# Patient Record
Sex: Female | Born: 1955 | Race: White | Hispanic: No | Marital: Single | Smoking: Former smoker
Health system: Southern US, Community
[De-identification: ages and names within clinical notes are randomized; demographics above are authoritative.]

## PROBLEM LIST (undated history)

## (undated) DIAGNOSIS — T7840XA Allergy, unspecified, initial encounter: Secondary | ICD-10-CM

## (undated) DIAGNOSIS — G4700 Insomnia, unspecified: Secondary | ICD-10-CM

## (undated) DIAGNOSIS — Z87442 Personal history of urinary calculi: Secondary | ICD-10-CM

## (undated) DIAGNOSIS — E785 Hyperlipidemia, unspecified: Secondary | ICD-10-CM

## (undated) DIAGNOSIS — R519 Headache, unspecified: Secondary | ICD-10-CM

## (undated) DIAGNOSIS — K219 Gastro-esophageal reflux disease without esophagitis: Secondary | ICD-10-CM

## (undated) DIAGNOSIS — Z8709 Personal history of other diseases of the respiratory system: Secondary | ICD-10-CM

## (undated) DIAGNOSIS — R531 Weakness: Secondary | ICD-10-CM

## (undated) DIAGNOSIS — Z860100 Personal history of colon polyps, unspecified: Secondary | ICD-10-CM

## (undated) DIAGNOSIS — K449 Diaphragmatic hernia without obstruction or gangrene: Secondary | ICD-10-CM

## (undated) DIAGNOSIS — F419 Anxiety disorder, unspecified: Secondary | ICD-10-CM

## (undated) DIAGNOSIS — M7072 Other bursitis of hip, left hip: Secondary | ICD-10-CM

## (undated) DIAGNOSIS — F329 Major depressive disorder, single episode, unspecified: Secondary | ICD-10-CM

## (undated) DIAGNOSIS — Z8619 Personal history of other infectious and parasitic diseases: Secondary | ICD-10-CM

## (undated) DIAGNOSIS — R51 Headache: Secondary | ICD-10-CM

## (undated) DIAGNOSIS — F32A Depression, unspecified: Secondary | ICD-10-CM

## (undated) DIAGNOSIS — M549 Dorsalgia, unspecified: Secondary | ICD-10-CM

## (undated) DIAGNOSIS — I729 Aneurysm of unspecified site: Secondary | ICD-10-CM

## (undated) DIAGNOSIS — G4730 Sleep apnea, unspecified: Secondary | ICD-10-CM

## (undated) HISTORY — DX: Anxiety disorder, unspecified: F41.9

## (undated) HISTORY — DX: Diaphragmatic hernia without obstruction or gangrene: K44.9

## (undated) HISTORY — PX: COLONOSCOPY: SHX174

## (undated) HISTORY — DX: Allergy, unspecified, initial encounter: T78.40XA

## (undated) HISTORY — DX: Aneurysm of unspecified site: I72.9

## (undated) HISTORY — PX: POLYPECTOMY: SHX149

## (undated) HISTORY — PX: ESOPHAGOGASTRODUODENOSCOPY: SHX1529

## (undated) HISTORY — DX: Other bursitis of hip, left hip: M70.72

## (undated) HISTORY — DX: Hyperlipidemia, unspecified: E78.5

## (undated) HISTORY — PX: DILATION AND CURETTAGE OF UTERUS: SHX78

---

## 1984-12-10 HISTORY — PX: WISDOM TOOTH EXTRACTION: SHX21

## 1985-12-10 HISTORY — PX: KNEE CARTILAGE SURGERY: SHX688

## 2002-12-10 HISTORY — PX: ABDOMINAL HYSTERECTOMY: SHX81

## 2003-12-11 LAB — HM MAMMOGRAPHY

## 2004-12-10 HISTORY — PX: CEREBRAL ANEURYSM REPAIR: SHX164

## 2004-12-18 ENCOUNTER — Ambulatory Visit: Payer: Self-pay | Admitting: Internal Medicine

## 2005-08-27 ENCOUNTER — Encounter: Payer: Self-pay | Admitting: Internal Medicine

## 2005-09-04 ENCOUNTER — Ambulatory Visit (HOSPITAL_COMMUNITY): Admission: RE | Admit: 2005-09-04 | Discharge: 2005-09-05 | Payer: Self-pay | Admitting: Interventional Radiology

## 2005-09-18 ENCOUNTER — Encounter: Payer: Self-pay | Admitting: Interventional Radiology

## 2005-12-14 ENCOUNTER — Ambulatory Visit (HOSPITAL_COMMUNITY): Admission: RE | Admit: 2005-12-14 | Discharge: 2005-12-14 | Payer: Self-pay | Admitting: Interventional Radiology

## 2006-07-10 ENCOUNTER — Ambulatory Visit (HOSPITAL_COMMUNITY): Admission: RE | Admit: 2006-07-10 | Discharge: 2006-07-10 | Payer: Self-pay | Admitting: Interventional Radiology

## 2006-09-05 ENCOUNTER — Ambulatory Visit: Payer: Self-pay | Admitting: Internal Medicine

## 2006-11-28 ENCOUNTER — Ambulatory Visit: Payer: Self-pay | Admitting: Specialist

## 2007-02-18 ENCOUNTER — Ambulatory Visit (HOSPITAL_COMMUNITY): Admission: RE | Admit: 2007-02-18 | Discharge: 2007-02-18 | Payer: Self-pay | Admitting: Interventional Radiology

## 2008-01-11 DIAGNOSIS — K449 Diaphragmatic hernia without obstruction or gangrene: Secondary | ICD-10-CM

## 2008-01-11 HISTORY — DX: Diaphragmatic hernia without obstruction or gangrene: K44.9

## 2008-03-01 ENCOUNTER — Ambulatory Visit (HOSPITAL_COMMUNITY): Admission: RE | Admit: 2008-03-01 | Discharge: 2008-03-01 | Payer: Self-pay | Admitting: Interventional Radiology

## 2009-10-11 ENCOUNTER — Ambulatory Visit: Payer: Self-pay | Admitting: Internal Medicine

## 2009-10-14 LAB — HM DEXA SCAN

## 2011-03-08 ENCOUNTER — Ambulatory Visit: Payer: Self-pay | Admitting: Internal Medicine

## 2011-04-27 NOTE — Discharge Summary (Signed)
NAME:  Katrina Woods, Katrina Woods                ACCOUNT NO.:  [id number]   MEDICAL RECORD NO.:  [id number]          PATIENT TYPE:  OIB   LOCATION:  3113                         FACILITY:  [facility]   PHYSICIAN:  Sanjeev K. Deveshwar, M.D.DATE OF BIRTH:  04-10-1956   DATE OF ADMISSION:  09/04/2005  DATE OF DISCHARGE:                                 DISCHARGE SUMMARY   BRIEF HISTORY:  This is a very pleasant 55 year old female with a history of  intermittent headaches for several years. She had a CT scan performed  recently that was consistent with an aneurysm. She was referred to Dr.  Corliss Skains, who saw the patient on August 27, 2005. Arrangements were made  for the patient to return to [facility] on September 26 to undergo  cerebral angiogram with possible coiling and stent.   PAST MEDICAL HISTORY:  Significant for hyperlipidemia, gastroesophageal  reflux disease. She had colitis as a child. She has borderline hypertension.   SURGICAL HISTORY:  Significant for hysterectomy, knee surgery, C-section,  and D&C.   ALLERGIES:  No known drug allergies.   MEDICATIONS PRIOR TO ADMISSION:  1.  Lipitor 20 mg daily.  2.  Nexium 40 mg daily.  3.  Zoloft b.i.d.  4.  Percocet p.r.n.  5.  Xanax p.r.n.  6.  Flexeril p.r.n.   SOCIAL HISTORY:  The patient is divorced. She has a boyfriend. She has two  children. She lives in [place]. She smokes one pack of cigarettes per day and  has done so for 25-30 years. She uses alcohol occasionally. She works as a  Production designer, theatre/television/film at [employer].   FAMILY HISTORY:  The patient's mother died at age 53 of lung cancer. She  also had diabetes. Her father is alive at age 50. He has no significant  medical illnesses. She has two brothers. One has hypertension, elevated  cholesterol levels, and diabetes. The other is alive and well.   HOSPITAL COURSE:  As noted, this patient was admitted to [facility]  on September 04, 2005 to undergo a cerebral angiogram  and possible  intervention on a suspected aneurysm. The angiogram was performed on the day  of admission. 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 The patient subsequently was placed under general anesthesia and  underwent coiling of the aneurysm as well as placement of a Neuroform stent.  She tolerated this procedure well. She was admitted to the neurology  intensive care unit following the procedure.   The patient was treated with IV heparin overnight. This was discontinued the  following morning. The sheath was removed from her right groin and she  remained at bedrest for 6 more hours. The plan at this point is to ambulate  the patient. If she is feeling well we will plan on discharge early this  evening. Her neurological exam has been stable without change and revealing  no deficits.   LABORATORY DATA:  On the day of discharge, hemoglobin was 11.8; hematocrit  34.4; wbc's 9200; platelets 207,000. BUN was 8, creatinine 1.0, potassium  4.2, glucose  110.   DISCHARGE INSTRUCTIONS:  The patient was told to remain on Plavix for 3 days  only. She was given a prescription. She was to take aspirin once daily. She  was to remain on her other home medications. She was told to stay on a low  salt low fat diet. She was not to drive, lift anything heavy, do any bending  or stooping, or to return to work for at least 2 weeks. She was to have a  repeat angiogram in 3 months. She was told to follow up with Dr. Welton Flakes as  needed or as scheduled. She was to see Dr. Corliss Skains Tuesday, September 18, 2005 at 2 p.m.   PROBLEM LIST AT TIME OF DISCHARGE:  1.  Cerebral aneurysm.  2.  Status post Neuroform stenting and coiling of the aneurysm with      excellent results.  3.  History of gastroesophageal reflux disease.  4.  History of severe headaches.  5.  History of hyperlipidemia.  6.  History of colitis as a child.  7.  Questionable borderline hypertension.  8.  Status  post multiple surgeries.  9.  Mild anemia.      Delton See, P.A.    ______________________________  Grandville Silos. Corliss Skains, M.D.    DR/MEDQ  D:  09/05/2005  T:  09/05/2005  Job:  [id number]   cc:   Yves Dill  Fax: [phone]

## 2011-04-27 NOTE — Consult Note (Signed)
NAME:  Katrina Woods, Katrina Woods                ACCOUNT NO.:  [id number]   MEDICAL RECORD NO.:  [id number]          PATIENT TYPE:  OUT   LOCATION:  XRAY                         FACILITY:  [facility]   PHYSICIAN:  Sanjeev K. Deveshwar, M.D.DATE OF BIRTH:  [birth year]   DATE OF CONSULTATION:  08/27/2005  DATE OF DISCHARGE:                                   CONSULTATION   REFERRING PHYSICIAN:  Yves Dill, MD   CHIEF COMPLAINT:  Cerebral aneurysm.   HISTORY OF PRESENT ILLNESS:  This is a very pleasant 55 year old right-  handed female with a history of intermittent severe headaches over the past  year.  She recently had a CT scan that was consistent with an aneurysm; she  was referred to Dr. Corliss Skains for further evaluation.   The patient reports having intermittent numbness of her extremities as well  as some occasional blurred vision and dizziness.  She recently fainted at  work; this is a brief episode.  She was mildly confused when she awoke;  however, she declined to have medical followup at that time.  She denies any  speech problems.  She is here now to discuss possible treatment options for  her aneurysm.   PAST MEDICAL HISTORY:  Significant for recently diagnosed hyperlipidemia,  currently being treated with Lipitor.  She has gastroesophageal reflux  disease for which she takes Nexium.  She had colitis as a child.  She has  had borderline hypertension.   PAST SURGICAL HISTORY:  The patient is status post hysterectomy, knee  surgery, C-section and D&C.   ALLERGIES:  No known drug allergies.   CURRENT MEDICATIONS:  Lipitor, Nexium, Zoloft, Percocet p.r.n., Xanax  p.r.n., Flexeril p.r.n.  She recently completed a course of doxycycline for  suspected Lyme's disease.   SOCIAL HISTORY:  The patient is divorced.  She is accompanied today by a  significant-other.  She has 2 children.  She lives in [place].  She smokes 1  pack cigarettes per day and has done so for 25-30 years.  She uses  alcohol  occasionally.  She works as a Production designer, theatre/television/film at [employer].   FAMILY HISTORY:  Her mother died at age 41 from lung cancer; she had  diabetes.  Her father is alive at age 73; he has no significant medical  illnesses.  She has 2 brothers, 1 has hypertension, elevated cholesterol  levels and diabetes; the other is alive and well.   REVIEW OF SYSTEMS:  Please Woods symptoms as noted above.   IMPRESSION:  1.  Recently diagnosed cerebral aneurysm.  2.  History of severe headaches.  3.  Hyperlipidemia.  4.  Gastroesophageal reflux disease.  5.  History of colitis as a child.  6.  Borderline hypertension.  7.  Status post multiple surgeries.   PLAN:  Dr. Corliss Skains discussed the patient's aneurysm as well as possible  treatment options including watchful waiting, surgical clipping and  endovascular coiling.  The risks and benefits were discussed in great  detail.  All the patient's questions were answered.  Dr. Corliss Skains  recommended a cerebral angiogram for further evaluation of  the aneurysm with  possible coiling to be performed directly after the angiogram.  He  recommended that the patient and her boyfriend go home and think about how  they would like to approach this issue.  We have tentatively scheduled the  patient for next Wednesday, the 27th, for her angiogram and possible  intervention.  She was told that she was free to cancel if she decided that  she did not want to proceed.      Katrina Woods, P.A.    ______________________________  Grandville Silos. Corliss Skains, M.D.    DR/MEDQ  D:  08/27/2005  T:  08/28/2005  Job:  [id number]   cc:   Yves Dill  [address]  [place]  [place] [address]  Fax: [phone]

## 2011-04-27 NOTE — Consult Note (Signed)
NAME:  Katrina Woods, Katrina Woods                ACCOUNT NO.:  [id number]   MEDICAL RECORD NO.:  [id number]          PATIENT TYPE:  OUT   LOCATION:  XRAY                         FACILITY:  [facility]   PHYSICIAN:  Sanjeev K. Deveshwar, M.D.DATE OF BIRTH:  1956-10-21   DATE OF CONSULTATION:  09/18/2005  DATE OF DISCHARGE:                                   CONSULTATION   BRIEF HISTORY:  This is a pleasant 55 year old female with a history of  intermittent headaches.  She had a CT scan performed which revealed a  basilar apex aneurysm.  The patient was referred to Dr. Corliss Skains for  further evaluation and treatment.  Dr. Corliss Skains met with the patient in  consultation and arrangements were made to have the patient admitted to  [facility] for a cerebral angiogram and coiling of the aneurysm.  The patient presented to [facility] on September 26 and underwent  coiling of the aneurysm under general anesthesia.  The patient tolerated  this well and arrangements were made to discharge the patient the following  day.   PAST MEDICAL HISTORY:  Significant for hyperlipidemia, gastroesophageal  reflux disease and a question of borderline hypertension.   SOCIAL HISTORY:  Significant for a hysterectomy, knee surgery, C-section,  and D&C.   ALLERGIES:  No known drug allergies.   CURRENT MEDICATIONS:  Lipitor, Nexium, Zoloft, Percocet p.r.n., Xanax  p.r.n., and Flexeril p.r.n.   FAMILY HISTORY:  The patient's mother died at age 7 from lung cancer.  Her  father is alive at age 23.  He has no significant medical illnesses.   IMPRESSION AND PLAN:  Dr. Corliss Skains met with the patient today.  She has  successfully quit smoking since her hospital discharge.  She has been  somewhat anxious due in part to withdrawing from tobacco as well as her  recent intervention.  She has occasional dull headaches.  She had a  particularly severe headache this past Sunday that was relieved with  Percocet and sleep.   She has had some stiffness in her neck.  She describes  a constant ringing in her right ear and blurred vision in her right eye.   The patient is currently taking aspirin daily.  She had been on Plavix in  addition to the aspirin initially after her hospitalization.   The patient has not yet returned to work.  She has a very stressful job.  She feels somewhat deconditioned following her procedure and does not feel  up to returning to work at this time.  Dr. Corliss Skains suggests that she make  arrangements to return to work for several hours at a time in order to  readjust to her work schedule.   The plan at this time is to perform a repeat cerebral angiogram 3 months out  from her initial coiling.   Greater than 30 minutes was spent with the patient today.      Delton See, P.A.    ______________________________  Grandville Silos. Corliss Skains, M.D.    DR/MEDQ  D:  09/18/2005  T:  09/18/2005  Job:  [id number]   cc:  Yves Dill  Fax: [phone]

## 2011-04-27 NOTE — H&P (Signed)
NAME:  Katrina Woods, Katrina Woods                ACCOUNT NO.:  [id number]   MEDICAL RECORD NO.:  [id number]          PATIENT TYPE:  AMB   LOCATION:  SDS                          FACILITY:  [facility]   PHYSICIAN:  Sanjeev K. Deveshwar, M.D.DATE OF BIRTH:  1956-11-02   DATE OF ADMISSION:  09/04/2005  DATE OF DISCHARGE:                                HISTORY & PHYSICAL   CHIEF COMPLAINT:  The patient is here for a cerebral angiogram today with  possible coiling, possible stenting of a cerebral aneurysm.   HISTORY OF PRESENT ILLNESS:  This is a pleasant 55 year old female with a  history of headaches.  She had a CT scan performed recently that was  consistent with an aneurysm.  The patient was referred to Dr. Corliss Skains, who  saw the patient on August 27, 2005, and discussed possible treatment  options for her aneurysm including watchful waiting, open surgery and  endovascular coiling.   The patient has been having some intermittent numbness of her extremities,  occasional blurred vision, dizziness and problems with her balance.  She had  an episode at work recently where she fainted briefly.  Since being seen in  consultation on August 27, 2005, the patient reports that she had had  some mild visual problems and continued poor balance but no other new  symptoms.  The patient has elected to proceed with a cerebral angiogram and  possible endovascular coiling and/or stenting of the aneurysm.   PAST MEDICAL HISTORY:  1.  Hyperlipidemia.  2.  Gastroesophageal reflux disease.  3.  Colitis as a child.  4.  Borderline hypertension.   PAST SURGICAL HISTORY:  1.  Hysterectomy.  2.  C-section.  3.  D&C.  4.  Knee surgery.   ALLERGIES:  No known drug allergies.   MEDICATIONS:  Lipitor, Nexium, Zoloft, Percocet, Xanax, Flexeril, and she  was recently treated with doxycycline for a suspected case of Lyme disease.  The patient was given aspirin and Plavix prior to the angiogram today.   SOCIAL  HISTORY:  The patient is divorced.  She has a boyfriend.  She has two  children.  She lives in [place].  She smokes one pack of cigarettes per day  and has done so for 25-30 years.  Dr. Corliss Skains strongly urged the patient  to quit smoking.  She also uses alcohol occasionally.  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   FAMILY HISTORY:  The patient's mother died at age 9 from lung cancer.  She  also had diabetes.  Her father is alive at age 62.  He has no significant  medical problems.  She has two brothers.  One has hypertension and elevated  cholesterol levels as well as diabetes.  The other is alive and well.   REVIEW OF SYSTEMS:  Completely negative except for some recent mild chest  discomfort, which she attributes to anxiety.  She notes that she bruises  easily.  She has had some visual problems and poor balance.  Otherwise, the  review of systems is negative.   PHYSICAL EXAMINATION:  GENERAL:  A very  pleasant 55 year old white female in  no acute distress.  VITAL SIGNS:  Blood pressure 105/75, pulse 65, respirations 16, temperature  96.8, oxygen saturation 99% on room air.  Her airway was rated at a 1, her  ASA scale was a 1.  HEENT:  Unremarkable.  NECK:  No bruits, no jugular venous distention.  CARDIAC:  Regular rate and rhythm without murmur.  CHEST:  Lungs are clear.  ABDOMEN:  Soft, nontender.  EXTREMITIES:  Pulses intact, without edema.  SKIN:  Warm and dry.  NEUROLOGIC:  Mental status:  The patient is alert and oriented, follows  commands.  Cranial nerves II-XII grossly intact.  Sensation is intact to  light touch.  Motor strength was 5/5 throughout except for the left upper  extremity, which was not tested due to anesthesia placing an IV line.  The  patient denies any weakness.  Cerebellar testing was intact on the right.   LABORATORY DATA:  BUN was 18, creatinine 1.1, potassium 4, glucose 104.  INR  was 0.9, PTT was 29.  Hemoglobin 13.3, hematocrit 38.3, platelets  162,000,  white blood cell count was 10.1 thousand.   IMPRESSION:  1.  History of cerebral aneurysm by CT scan, cerebral angiogram today with      possible endovascular coiling and/or stenting of the aneurysm.  2.  History of hyperlipidemia.  3.  Gastroesophageal reflux disease.  4.  History of borderline hypertension.  5.  Status post multiple surgeries as noted above.  6.  Probable anxiety and depression.  7.  Ongoing tobacco use.  8.  History of severe headaches.      Delton See, P.A.    ______________________________  Grandville Silos. Corliss Skains, M.D.   DR/MEDQ  D:  09/04/2005  T:  09/04/2005  Job:  [id number]   cc:   Yves Dill  Fax: [phone]   Payton Doughty, M.D.  Fax: [phone]

## 2011-04-27 NOTE — Discharge Summary (Signed)
NAME:  Katrina, Woods                ACCOUNT NO.:  [id number]   MEDICAL RECORD NO.:  [id number]          PATIENT TYPE:  OIB   LOCATION:  3113                         FACILITY:  [facility]   PHYSICIAN:  Sanjeev K. Deveshwar, M.D.DATE OF BIRTH:  19-Oct-1956   DATE OF ADMISSION:  09/04/2005  DATE OF DISCHARGE:  09/05/2005                                 DISCHARGE SUMMARY   ADDENDUM:  This aneurysm was treated with endovascular coiling.  A neuroform  stent was not used during the procedure as previously dictated.  Please see  Dr. Fatima Sanger complete dictated report for full details.      Delton See, P.A.    ______________________________  Grandville Silos. Corliss Skains, M.D.    DR/MEDQ  D:  09/05/2005  T:  09/05/2005  Job:  [id number]   cc:   Drue Second, MD  Fax: [phone]

## 2011-04-27 NOTE — Consult Note (Signed)
NAME:  GRACELAND, WACHTER                ACCOUNT NO.:  [id number]   MEDICAL RECORD NO.:  [id number]          PATIENT TYPE:  OUT   LOCATION:  XRAY                         FACILITY:  [facility]   PHYSICIAN:  Sanjeev K. Deveshwar, M.D.DATE OF BIRTH:  1956-06-09   DATE OF CONSULTATION:  08/27/2005  DATE OF DISCHARGE:                                   CONSULTATION   ADDENDUM  Greater than 40 minutes was spent on this consult today.      Delton See, P.A.    ______________________________  Grandville Silos. Corliss Skains, M.D.    DR/MEDQ  D:  08/27/2005  T:  08/27/2005  Job:  [id number]

## 2011-09-03 LAB — PROTIME-INR
INR: 0.8
Prothrombin Time: 11.7

## 2011-09-03 LAB — CBC
HCT: 43.4
Hemoglobin: 14.9
MCHC: 34.3
MCV: 89.8
Platelets: 244
RBC: 4.83
RDW: 13.6
WBC: 7.5

## 2011-09-03 LAB — BASIC METABOLIC PANEL
BUN: 14
Chloride: 105
GFR calc Af Amer: >60
GFR calc non Af Amer: >60

## 2011-09-28 ENCOUNTER — Ambulatory Visit: Payer: Self-pay | Admitting: Internal Medicine

## 2012-07-10 DIAGNOSIS — M7072 Other bursitis of hip, left hip: Secondary | ICD-10-CM

## 2012-07-10 HISTORY — DX: Other bursitis of hip, left hip: M70.72

## 2012-09-11 ENCOUNTER — Encounter: Payer: Self-pay | Admitting: Internal Medicine

## 2012-09-11 ENCOUNTER — Ambulatory Visit (INDEPENDENT_AMBULATORY_CARE_PROVIDER_SITE_OTHER): Payer: BC Managed Care – PPO | Admitting: Internal Medicine

## 2012-09-11 VITALS — BP 140/86 | HR 70 | Temp 98.0°F | Ht 62.5 in | Wt 140.8 lb

## 2012-09-11 DIAGNOSIS — E785 Hyperlipidemia, unspecified: Secondary | ICD-10-CM

## 2012-09-11 DIAGNOSIS — M7072 Other bursitis of hip, left hip: Secondary | ICD-10-CM | POA: Insufficient documentation

## 2012-09-11 DIAGNOSIS — Z1159 Encounter for screening for other viral diseases: Secondary | ICD-10-CM

## 2012-09-11 DIAGNOSIS — Z23 Encounter for immunization: Secondary | ICD-10-CM

## 2012-09-11 DIAGNOSIS — Z1239 Encounter for other screening for malignant neoplasm of breast: Secondary | ICD-10-CM

## 2012-09-11 DIAGNOSIS — F329 Major depressive disorder, single episode, unspecified: Secondary | ICD-10-CM

## 2012-09-11 DIAGNOSIS — I729 Aneurysm of unspecified site: Secondary | ICD-10-CM

## 2012-09-11 DIAGNOSIS — Z720 Tobacco use: Secondary | ICD-10-CM

## 2012-09-11 DIAGNOSIS — F17200 Nicotine dependence, unspecified, uncomplicated: Secondary | ICD-10-CM

## 2012-09-11 DIAGNOSIS — F32A Depression, unspecified: Secondary | ICD-10-CM

## 2012-09-11 DIAGNOSIS — F3289 Other specified depressive episodes: Secondary | ICD-10-CM

## 2012-09-11 DIAGNOSIS — M546 Pain in thoracic spine: Secondary | ICD-10-CM

## 2012-09-11 DIAGNOSIS — R1011 Right upper quadrant pain: Secondary | ICD-10-CM

## 2012-09-11 DIAGNOSIS — K449 Diaphragmatic hernia without obstruction or gangrene: Secondary | ICD-10-CM | POA: Insufficient documentation

## 2012-09-11 DIAGNOSIS — I671 Cerebral aneurysm, nonruptured: Secondary | ICD-10-CM | POA: Insufficient documentation

## 2012-09-11 MED ORDER — ALPRAZOLAM 0.5 MG PO TABS: 0.5000 mg | ORAL_TABLET | Freq: Every evening | ORAL | Status: DC | PRN

## 2012-09-11 MED ORDER — ESCITALOPRAM OXALATE 5 MG PO TABS: 5.0000 mg | ORAL_TABLET | Freq: Every day | ORAL | Status: DC

## 2012-09-11 NOTE — Patient Instructions (Addendum)
We are prescribing low dose lexapro for hot flashes and depression.  Start with 1/2 tabl daily for one week, the in crease to 1 talbet.  We may incese at one month follow up.   Return for fasting labs at your leisure.  Return for follow up in one month

## 2012-09-11 NOTE — Progress Notes (Signed)
Patient ID: JULIANY DAUGHETY, female   DOB: 02-10-1956, 55 y.o.   MRN: [id number]   Patient Active Problem List  Diagnosis  . Bursitis of left hip  . Hiatal hernia  . Hyperlipidemia  . Aneurysm  . Tobacco abuse  . Depression  . Back pain, thoracic    Subjective:  CC:   Chief Complaint  Patient presents with  . Establish Care    new patient  . Injections    flu    HPI:   TENIYAH SEIVERT is a 56 y.o. female who presents as a new patient to establish primary care with the chief complaint of  Abdominal pain.  Right UQ, starts in back wraps around to front. Pain 4/10n in the morning, at night is 7/10 not sure if it is aggravated by eating but gets progressively worse as the day goes on.  Not made worse by smoking (10-12 per day) Wakes her up multiple times per night ,  Along with night sweats.  History of hiatal hernia found during workup for chest pain due to panic attack.  Stress test was normal. Sent to [facility] to discuss back pain before the results of the CT was done( in office to evaluate 2 yr history of persistent pain right posterior back pain that radiates to the RUQ,  More intense as the day progresses Ct was negative,  ultrasound was notable for fluid fill,ed no stones.,   negative. HIDA noted normal EF  But took a long time to fill the GB.    Gabapentin titrated to the point of dizziness with no relief of pain .   2)3) History of brain aneurysm, history of syncope and headaches.  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  Last follow up May 18, 2008. Quit smoking for 8 months and has resumed tobacco use.  Wants to quit  History of depression started after mother died in May 18, 2000.  Still takes care of father who had Alzheimers Dementia who lives alone but she spends every evening with him and administers his pain meds for chreonci pain trauma induced .  Single. Lives alone, has a dog. 30 lbs,  Spitz.   Treated with SSRI  For a while but she stopped.  Had  panic attacks treated with prn xanax [facility] [employer] . Previous patient of Welton Flakes Transferring bc she was continually seen by the PA .  History of brain aneurysm, history of syncope and headaches.  Positive MRA/MRA referred to [employer] in 05-18-2005.  Last follow up 2008/05/18. Quit smoking for 8 months and has resumed tobacco use.      HM: no mammogram in years. History of hyperlipidemia with intolerance to statins (joint pain) incl simvastatin, lipitor and crestor    Past Medical History  Diagnosis Date  . Bursitis of left hip 8-13    takes Meloxicam  . Chicken pox as a child  . Measles as a child  . [place] measles as a child  . Mumps as a child  . Hiatal hernia 209  . Hyperlipidemia   . Aneurysm 05-18-2005    brain- treated by Dr Granville Lewis (?) [facility]  . Endometriosis     Past Surgical History  Procedure Date  . Abdominal hysterectomy 05-19-2003    total  . Knee cartilage surgery 1987    Left knee- torn cartlidge  . Cesarean section 11-25-82  . Wisdom tooth extraction 1986    all four extracted  . Cerebral  aneurysm repair 2006    Deveshwar, coil     Family History  Problem Relation Age of Onset  . Diabetes Mother     type 2  . Hypertension Mother   . Heart disease Mother     CHF  . Cancer Mother     lung- previous smoker- had quit 12 yrs before  . Appendicitis Father     acute  . Cancer Father     prostate?  . Diabetes Brother 52    type 2  . Hypertension Brother   . Cancer Maternal Grandmother     breast, uterine  . Hypertension Maternal Grandmother   . Heart disease Maternal Grandmother     CHF  . Diabetes Maternal Grandmother   . Stroke Maternal Grandfather   . Heart block Paternal Grandfather     massive    History   Social History  . Marital Status: Single    Spouse Name: N/A    Number of Children: N/A  . Years of Education: N/A   Occupational History  . Not on file.   Social History Main Topics  . Smoking status: Current Every Day Smoker  -- 0.5 packs/day for 28 years    Types: Cigarettes  . Smokeless tobacco: Never Used  . Alcohol Use: Yes     socially  . Drug Use: No  . Sexually Active: Not Currently   Other Topics Concern  . Not on file   Social History Narrative  . No narrative on file   Allergies  Allergen Reactions  . Statins     Muscle aches     Review of Systems:   The remainder of the review of systems was negative except occasional constipation treated with diet and water.  Some wt loss , unintentional due to depression,  o 15 lbs in the last 2 years.       Objective:  BP 140/86  Pulse 70  Temp 98 F (36.7 C) (Temporal)  Ht 5' 2.5" (1.588 m)  Wt 140 lb 12.8 oz (63.866 kg)  BMI 25.34 kg/m2  SpO2 99%  General appearance: alert, cooperative and appears stated age Ears: normal TM's and external ear canals both ears Throat: lips, mucosa, and tongue normal; teeth and gums normal Neck: no adenopathy, no carotid bruit, supple, symmetrical, trachea midline and thyroid not enlarged, symmetric, no tenderness/mass/nodules Back: symmetric, no curvature. ROM normal. No CVA tenderness. Lungs: clear to auscultation bilaterally Heart: regular rate and rhythm, S1, S2 normal, no murmur, click, rub or gallop Abdomen: soft, non-tender; bowel sounds normal; no masses,  no organomegaly Pulses: 2+ and symmetric Skin: Skin color, texture, turgor normal. No rashes or lesions Lymph nodes: Cervical, supraclavicular, and axillary nodes normal.  Assessment and Plan:  Tobacco abuse Chief for about 8 months after her brain aneurysm repair . she is interested in quitting  Aneurysm Reportedly basilar artery status post coiling by Dr. Corliss Skains in  2006. She's been lost to followup since 2009. Will refer her back to Dr. Gavin Pound for for followup.  Currently asymptomatic.  Hyperlipidemia She will return for fasting lipids and management.  Depression With concurrent anxiety and hot flashes. Trial of Lexapro. Return  in one month.  Back pain, thoracic Her history of 2 years of right-sided back pain which radiates to her right upper quadrant is troubling for gallbladder disease versus vascular disease given her history of tobacco abuse and prior aneurysm brain aneurysm. Her CT scan that was done 2 years ago by Dr. Romeo Apple  has been requested. The ultrasound and hydroscan that were done the hospital suggested a gallbladder that was full fluid with no stones and hydroscan suggested that her gallbladder was delayed in filling but had no trouble emptying. She has no right upper quadrant tenderness on exam. There has been no escalation of her pain. She will return for fasting labs. We'll consider ordering plain films of her thoracic spine at her repeat visit   Updated Medication List Outpatient Encounter Prescriptions as of 09/11/2012  Medication Sig Dispense Refill  . ibuprofen (ADVIL,MOTRIN) 200 MG tablet Take 400 mg by mouth every 8 (eight) hours as needed.      . meloxicam (MOBIC) 15 MG tablet Take 15 mg by mouth daily.      Marland Kitchen omeprazole (PRILOSEC OTC) 20 MG tablet Take 20 mg by mouth daily.      . traMADol (ULTRAM) 50 MG tablet Take 50 mg by mouth every 8 (eight) hours as needed.      . ALPRAZolam (XANAX) 0.5 MG tablet Take 1 tablet (0.5 mg total) by mouth at bedtime as needed for sleep.  30 tablet  3  . escitalopram (LEXAPRO) 5 MG tablet Take 1 tablet (5 mg total) by mouth daily.  30 tablet  1     Orders Placed This Encounter  Procedures  . HM MAMMOGRAPHY  . MM Digital Screening  . HM PAP SMEAR  . Lipid panel  . TSH  . Gamma GT  . Hepatitis C antibody, reflex    No Follow-up on file.

## 2012-09-12 ENCOUNTER — Encounter: Payer: Self-pay | Admitting: Internal Medicine

## 2012-09-12 DIAGNOSIS — Z87891 Personal history of nicotine dependence: Secondary | ICD-10-CM | POA: Insufficient documentation

## 2012-09-12 DIAGNOSIS — Z720 Tobacco use: Secondary | ICD-10-CM | POA: Insufficient documentation

## 2012-09-12 DIAGNOSIS — M546 Pain in thoracic spine: Secondary | ICD-10-CM | POA: Insufficient documentation

## 2012-09-12 DIAGNOSIS — F418 Other specified anxiety disorders: Secondary | ICD-10-CM | POA: Insufficient documentation

## 2012-09-12 NOTE — Assessment & Plan Note (Signed)
With concurrent anxiety and hot flashes. Trial of Lexapro. Return in one month.

## 2012-09-12 NOTE — Assessment & Plan Note (Signed)
Reportedly basilar artery status post coiling by Dr. Corliss Skains in  2006. She's been lost to followup since 2009. Will refer her back to Dr. Gavin Pound for for followup.  Currently asymptomatic.

## 2012-09-12 NOTE — Assessment & Plan Note (Addendum)
Her history of 2 years of right-sided back pain which radiates to her right upper quadrant is troubling for gallbladder disease versus vascular disease given her history of tobacco abuse and prior aneurysm brain aneurysm. Her CT scan that was done 2 years ago by Dr. Romeo Apple has been requested. The ultrasound and hydroscan that were done the hospital suggested a gallbladder that was full fluid with no stones and hydroscan suggested that her gallbladder was delayed in filling but had no trouble emptying. She has no right upper quadrant tenderness on exam. There has been no escalation of her pain. She will return for fasting labs. We'll consider ordering plain films of her thoracic spine at her repeat visit

## 2012-09-12 NOTE — Assessment & Plan Note (Signed)
She will return for fasting lipids and management.

## 2012-09-12 NOTE — Assessment & Plan Note (Signed)
Chief for about 8 months after her brain aneurysm repair . she is interested in quitting

## 2012-09-15 ENCOUNTER — Other Ambulatory Visit (INDEPENDENT_AMBULATORY_CARE_PROVIDER_SITE_OTHER): Payer: BC Managed Care – PPO

## 2012-09-15 DIAGNOSIS — F32A Depression, unspecified: Secondary | ICD-10-CM

## 2012-09-15 DIAGNOSIS — R1011 Right upper quadrant pain: Secondary | ICD-10-CM

## 2012-09-15 DIAGNOSIS — F3289 Other specified depressive episodes: Secondary | ICD-10-CM

## 2012-09-15 DIAGNOSIS — E785 Hyperlipidemia, unspecified: Secondary | ICD-10-CM

## 2012-09-15 DIAGNOSIS — F329 Major depressive disorder, single episode, unspecified: Secondary | ICD-10-CM

## 2012-09-15 DIAGNOSIS — Z1159 Encounter for screening for other viral diseases: Secondary | ICD-10-CM

## 2012-09-15 LAB — LIPID PANEL: VLDL: 28.8 mg/dL (ref 0.0–40.0)

## 2012-10-14 ENCOUNTER — Ambulatory Visit (INDEPENDENT_AMBULATORY_CARE_PROVIDER_SITE_OTHER): Payer: BC Managed Care – PPO | Admitting: Internal Medicine

## 2012-10-14 ENCOUNTER — Encounter: Payer: Self-pay | Admitting: Internal Medicine

## 2012-10-14 ENCOUNTER — Encounter: Payer: Self-pay | Admitting: Gastroenterology

## 2012-10-14 VITALS — BP 130/84 | HR 70 | Temp 98.0°F | Ht 62.0 in | Wt 141.5 lb

## 2012-10-14 DIAGNOSIS — K219 Gastro-esophageal reflux disease without esophagitis: Secondary | ICD-10-CM

## 2012-10-14 DIAGNOSIS — G8929 Other chronic pain: Secondary | ICD-10-CM

## 2012-10-14 DIAGNOSIS — Z1239 Encounter for other screening for malignant neoplasm of breast: Secondary | ICD-10-CM

## 2012-10-14 DIAGNOSIS — M545 Low back pain, unspecified: Secondary | ICD-10-CM

## 2012-10-14 DIAGNOSIS — F17200 Nicotine dependence, unspecified, uncomplicated: Secondary | ICD-10-CM

## 2012-10-14 DIAGNOSIS — Z720 Tobacco use: Secondary | ICD-10-CM

## 2012-10-14 DIAGNOSIS — Z1211 Encounter for screening for malignant neoplasm of colon: Secondary | ICD-10-CM

## 2012-10-14 DIAGNOSIS — R1011 Right upper quadrant pain: Secondary | ICD-10-CM

## 2012-10-14 DIAGNOSIS — R109 Unspecified abdominal pain: Secondary | ICD-10-CM

## 2012-10-14 DIAGNOSIS — M546 Pain in thoracic spine: Secondary | ICD-10-CM

## 2012-10-14 DIAGNOSIS — I729 Aneurysm of unspecified site: Secondary | ICD-10-CM

## 2012-10-14 MED ORDER — TRAMADOL HCL 50 MG PO TABS: 50.0000 mg | ORAL_TABLET | Freq: Three times a day (TID) | ORAL | Status: DC | PRN

## 2012-10-14 MED ORDER — OMEPRAZOLE 40 MG PO CPDR: 40.0000 mg | DELAYED_RELEASE_CAPSULE | Freq: Every day | ORAL | Status: DC

## 2012-10-14 NOTE — Patient Instructions (Addendum)
1200 mg calcium daily (diet and supplements)   1000 units of Vitamin D daily    GI referral underway  Changing omeprazole to prescription strength 40 mg daily     plain films of thoracic and lumbar spines ordered  Return for a fasting glucose for your wellness form

## 2012-10-14 NOTE — Progress Notes (Signed)
Patient ID: Katrina Woods, female   DOB: [birth year], 56 y.o.   MRN: [id number] Patient Active Problem List  Diagnosis  . Bursitis of left hip  . Hiatal hernia  . Hyperlipidemia  . Aneurysm  . Tobacco abuse  . Depression  . Back pain, thoracic  . Abdominal pain, chronic, right upper quadrant  . GERD (gastroesophageal reflux disease)    Subjective:  CC:   Chief Complaint  Patient presents with  . Follow-up    HPI:   Katrina Aguirre Smithis a 56 y.o. female who presents Follow up on pain.  She has right sided pain that is chronic in nature with prior gallbladder evaluation which included a normal HIDA scan,  Her pain is constant ,  Not aggravated  By eating. 2)   Has developed sacral pain center of back radiates to the right hip.  3) Daily reflux treated with OTC omeprazole.symptoms occur daily for the past 12 years,  No prior endoscopy.  Has hiatal hernia which was found during eval for chest pain.  Pain occurs daily if she skips the omeprazole. Has nighttime symptoms even with omeprazole.  Insurance would not pay for dexilant which worked Agricultural consultant. Nexium did not work.     Past Medical History  Diagnosis Date  . Bursitis of left hip 8-13    takes Meloxicam  . Chicken pox as a child  . Measles as a child  . [place] measles as a child  . Mumps as a child  . Hiatal hernia 209  . Hyperlipidemia   . 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 Endometriosis     Past Surgical History  Procedure Date  . Abdominal hysterectomy 2004    total  . Knee cartilage surgery 1987    Left knee- torn cartlidge  . Cesarean section 11-25-82  . Wisdom tooth extraction 1986    all four extracted  . Cerebral aneurysm repair 2006    Deveshwar, coil          The following portions of the patient's history were reviewed and updated as appropriate: Allergies, current medications, and problem list.    Review of Systems:   12 Pt  review of systems was negative except  those addressed in the HPI,     History   Social History  . Marital Status: Single    Spouse Name: N/A    Number of Children: N/A  . Years of Education: N/A   Occupational History  . Not on file.   Social History Main Topics  . Smoking status: Current Every Day Smoker -- 0.5 packs/day for 28 years    Types: Cigarettes  . Smokeless tobacco: Never Used  . Alcohol Use: Yes     Comment: socially  . Drug Use: No  . Sexually Active: Not Currently   Other Topics Concern  . Not on file   Social History Narrative  . No narrative on file    Objective:  BP 130/84  Pulse 70  Temp 98 F (36.7 C) (Oral)  Ht 5\' 2"  (1.575 m)  Wt 141 lb 8 oz (64.184 kg)  BMI 25.88 kg/m2  SpO2 98%  General appearance: alert, cooperative and appears stated age Ears: normal TM's and external ear canals both ears Throat: lips, mucosa, and tongue normal; teeth and gums normal Neck: no adenopathy, no carotid bruit, supple, symmetrical, trachea midline and thyroid not enlarged, symmetric, no tenderness/mass/nodules Back: symmetric, no curvature. ROM normal. No  CVA tenderness. Lungs: clear to auscultation bilaterally Heart: regular rate and rhythm, S1, S2 normal, no murmur, click, rub or gallop Abdomen: soft, non-tender; bowel sounds normal; no masses,  no organomegaly Pulses: 2+ and symmetric Skin: Skin color, texture, turgor normal. No rashes or lesions Lymph nodes: Cervical, supraclavicular, and axillary nodes normal.  Assessment and Plan:  Back pain, thoracic Plain films showed no evidence of degenerative changes or arthritis. We'll recommend physical therapy  Aneurysm She has been referred back to Dr. Edwena Felty followup. I've cautioned her that resuming smoking is probably the worse thing she could do for herself in the setting  Tobacco abuse Patient strongly urged to quit smoking given her history of cerebral aneurysms status post coiling  Abdominal pain, chronic, right upper  quadrant Etiology unclear. Her gallbladder had no evidence of stones and hydroscan showed normal ejection fraction. Plain thoracic spine films showed no evidence of degenerative disc changes or arthritic changes. We'll offer CT the abdomen and pelvis for further evaluation  GERD (gastroesophageal reflux disease) Apparently present for over 10 years with no prior endoscopic evaluation for Barretts esophagus. She has been referred to GI for screening colonoscopy and endoscopy.   Updated Medication List Outpatient Encounter Prescriptions as of 10/14/2012  Medication Sig Dispense Refill  . ALPRAZolam (XANAX) 0.5 MG tablet Take 1 tablet (0.5 mg total) by mouth at bedtime as needed for sleep.  30 tablet  3  . escitalopram (LEXAPRO) 5 MG tablet Take 1 tablet (5 mg total) by mouth daily.  30 tablet  1  . ibuprofen (ADVIL,MOTRIN) 200 MG tablet Take 400 mg by mouth every 8 (eight) hours as needed.      . meloxicam (MOBIC) 15 MG tablet Take 15 mg by mouth daily.      . traMADol (ULTRAM) 50 MG tablet Take 1 tablet (50 mg total) by mouth every 8 (eight) hours as needed.  60 tablet  3  . [DISCONTINUED] omeprazole (PRILOSEC OTC) 20 MG tablet Take 20 mg by mouth daily.      . [DISCONTINUED] traMADol (ULTRAM) 50 MG tablet Take 50 mg by mouth every 8 (eight) hours as needed.      Marland Kitchen omeprazole (PRILOSEC) 40 MG capsule Take 1 capsule (40 mg total) by mouth daily.  30 capsule  3     Orders Placed This Encounter  Procedures  . MM Digital Screening  . HM DEXA SCAN  . DG Thoracic Spine W/Swimmers  . DG Lumbar Spine 2-3 Views  . Ambulatory referral to Gastroenterology    No Follow-up on file.

## 2012-10-15 ENCOUNTER — Telehealth: Payer: Self-pay | Admitting: Internal Medicine

## 2012-10-15 ENCOUNTER — Other Ambulatory Visit (INDEPENDENT_AMBULATORY_CARE_PROVIDER_SITE_OTHER): Payer: BC Managed Care – PPO

## 2012-10-15 ENCOUNTER — Ambulatory Visit: Payer: Self-pay | Admitting: Internal Medicine

## 2012-10-15 DIAGNOSIS — R5383 Other fatigue: Secondary | ICD-10-CM

## 2012-10-15 DIAGNOSIS — R5381 Other malaise: Secondary | ICD-10-CM

## 2012-10-15 DIAGNOSIS — E785 Hyperlipidemia, unspecified: Secondary | ICD-10-CM

## 2012-10-15 DIAGNOSIS — E569 Vitamin deficiency, unspecified: Secondary | ICD-10-CM

## 2012-10-15 LAB — CBC WITH DIFFERENTIAL/PLATELET
Basophils Absolute: 0 10*3/uL (ref 0.0–0.1)
Eosinophils Absolute: 0.1 10*3/uL (ref 0.0–0.7)
HCT: 45.9 % (ref 36.0–46.0)
Hemoglobin: 15.1 g/dL — ABNORMAL HIGH (ref 12.0–15.0)
Lymphs Abs: 1.8 10*3/uL (ref 0.7–4.0)
MCHC: 32.8 g/dL (ref 30.0–36.0)
Neutro Abs: 3.8 10*3/uL (ref 1.4–7.7)
RDW: 13.3 % (ref 11.5–14.6)

## 2012-10-15 LAB — COMPREHENSIVE METABOLIC PANEL
ALT: 10 U/L (ref 0–35)
Albumin: 4 g/dL (ref 3.5–5.2)
Alkaline Phosphatase: 49 U/L (ref 39–117)
Glucose, Bld: 92 mg/dL (ref 70–99)
Potassium: 4.5 mEq/L (ref 3.5–5.1)
Sodium: 138 mEq/L (ref 135–145)
Total Protein: 7.4 g/dL (ref 6.0–8.3)

## 2012-10-15 NOTE — Telephone Encounter (Signed)
Her health screening form is ready for sending.  Pleas call patient when it has been faxed and scan to chart.

## 2012-10-15 NOTE — Telephone Encounter (Signed)
[facility] health care provider biometric screening form in box Please advise pt when done

## 2012-10-15 NOTE — Telephone Encounter (Signed)
Left message on patient vm letting her know that her wellness form was faxed and ready for pickup.

## 2012-10-16 ENCOUNTER — Telehealth: Payer: Self-pay | Admitting: Internal Medicine

## 2012-10-16 DIAGNOSIS — D696 Thrombocytopenia, unspecified: Secondary | ICD-10-CM

## 2012-10-16 DIAGNOSIS — K219 Gastro-esophageal reflux disease without esophagitis: Secondary | ICD-10-CM | POA: Insufficient documentation

## 2012-10-16 DIAGNOSIS — G8929 Other chronic pain: Secondary | ICD-10-CM | POA: Insufficient documentation

## 2012-10-16 DIAGNOSIS — R1011 Right upper quadrant pain: Secondary | ICD-10-CM

## 2012-10-16 LAB — VITAMIN D 25 HYDROXY (VIT D DEFICIENCY, FRACTURES): Vit D, 25-Hydroxy: 43 ng/mL (ref 30–89)

## 2012-10-16 NOTE — Telephone Encounter (Signed)
Regarding her blood work, everything was normal with the exception of 2 things. Her cholesterol is high LDL was 160. Given her history of tobacco abuse this combination greatly increased her risk for strokes. Given the fact that she has already had a cerebral aneurysm she really needs to quit smoking.  #2 her platelets were low at 85,000.  normal is 150,000-400,000. Has she ever been told this before? If not we should repeat a CBC and LDH  in one month. If she's taking any aspirin products she should stop them.

## 2012-10-16 NOTE — Assessment & Plan Note (Signed)
Patient strongly urged to quit smoking given her history of cerebral aneurysms status post coiling

## 2012-10-16 NOTE — Assessment & Plan Note (Signed)
Plain films showed no evidence of degenerative changes or arthritis. We'll recommend physical therapy

## 2012-10-16 NOTE — Telephone Encounter (Signed)
Her thoracic and lumbar spine films were largely normal with only very mild changes in the lumbar spine. Nothing to explain her right upper quadrant pain. Since her pain is persistent I would like to offer her the chance to get a CT of the abdomen and pelvis with IV and oral contrast.If she  would like to wait until after she sees GI for her endoscopy and colonoscopy that is ok too

## 2012-10-16 NOTE — Assessment & Plan Note (Signed)
Apparently present for over 10 years with no prior endoscopic evaluation for Barretts esophagus. She has been referred to GI for screening colonoscopy and endoscopy.

## 2012-10-16 NOTE — Assessment & Plan Note (Signed)
Etiology unclear. Her gallbladder had no evidence of stones and hydroscan showed normal ejection fraction. Plain thoracic spine films showed no evidence of degenerative disc changes or arthritic changes. We'll offer CT the abdomen and pelvis for further evaluation

## 2012-10-16 NOTE — Assessment & Plan Note (Signed)
She has been referred back to Dr. Edwena Felty followup. I've cautioned her that resuming smoking is probably the worse thing she could do for herself in the setting

## 2012-10-17 NOTE — Telephone Encounter (Signed)
Ordered

## 2012-10-17 NOTE — Telephone Encounter (Signed)
Patient notified

## 2012-10-17 NOTE — Telephone Encounter (Signed)
Spoke to patient regarding lab results she stated that if she can get a CT before 11/27/12 she will be happy to do it.

## 2012-10-21 ENCOUNTER — Telehealth: Payer: Self-pay | Admitting: Internal Medicine

## 2012-10-21 NOTE — Telephone Encounter (Signed)
Patient wanting her lab results posted to my chart. The patient wanted to see if her paper work for her wellness screening was filled out with her blood glucose and cholesterol and all the other information that they requested .

## 2012-10-21 NOTE — Telephone Encounter (Signed)
Spoke to patient form was faxed on 10/15/12 to [phone]. Advised patient that she could come by the office to pick up her original copy.

## 2012-10-24 ENCOUNTER — Ambulatory Visit: Payer: Self-pay | Admitting: Internal Medicine

## 2012-10-27 ENCOUNTER — Telehealth: Payer: Self-pay | Admitting: Internal Medicine

## 2012-10-27 DIAGNOSIS — K862 Cyst of pancreas: Secondary | ICD-10-CM

## 2012-10-27 DIAGNOSIS — R1011 Right upper quadrant pain: Secondary | ICD-10-CM

## 2012-10-27 MED ORDER — ESOMEPRAZOLE MAGNESIUM 40 MG PO CPDR: 40.0000 mg | DELAYED_RELEASE_CAPSULE | Freq: Every day | ORAL | Status: DC

## 2012-10-27 NOTE — Telephone Encounter (Signed)
Her CT of abdomen and pelvis was normal except for a 4 mm cyst in the pancreas,  Too small to characterize.  The radiologist is recommending repeat CT in 6 months to make sure it is not changing or enlarging.

## 2012-10-27 NOTE — Telephone Encounter (Signed)
Patient notified verbal of results. Patient wants to know what she can do in the meantime for pain.

## 2012-10-27 NOTE — Telephone Encounter (Signed)
I am going to change her omeprazole to nexium and start a gi referral for endoscopy.  Does she have a preference/

## 2012-10-27 NOTE — Telephone Encounter (Signed)
Pt called she wanted more info on her ct results

## 2012-10-28 NOTE — Telephone Encounter (Signed)
Spoke to patient via phone she states that she does not have a preference for GI referral. Patient states that she have an appt on12/5/13 for pre op. And  appt on  11/27/12 colonoscopy.

## 2012-10-28 NOTE — Telephone Encounter (Signed)
Katrina Woods,   This patient is set up for colonoscopy but needs an EGD as well because of persistent RUQ pain. She just had a Normal CT except to 4 mm pancreatic cyst which needs 6 month followup.  Please call GI referral and add Referral for RUQ pain as well so they can cover this in her preop and change the colonoscopy to EGD/colonoscopy

## 2012-10-28 NOTE — Addendum Note (Signed)
Addended by: Sherlene Shams on: 10/28/2012 12:33 PM   Modules accepted: Orders

## 2012-10-29 ENCOUNTER — Telehealth: Payer: Self-pay | Admitting: Gastroenterology

## 2012-10-29 NOTE — Telephone Encounter (Signed)
The patient is scheduled for a screening colon on 11/27/12.  She has GERD.  Please see phone note 10/27/12 and office notes from 10/14/12 of Dr. Darrick Huntsman.  There is not an opening to do a double for 11/27/12, but is it ok to do a direct EGD in January or office visit for eval first?

## 2012-10-29 NOTE — Telephone Encounter (Signed)
OK for direct EGD in Jan, or sooner if she prefers and there is an opening.

## 2012-10-30 NOTE — Telephone Encounter (Signed)
EGD scheduled for 12/18/11.  Marge advised that if the patient wants to do on the same day will be after the first of the year and she can reschedule those dates when she comes for the pre-visit

## 2012-10-31 ENCOUNTER — Encounter: Payer: Self-pay | Admitting: Internal Medicine

## 2012-11-10 ENCOUNTER — Encounter: Payer: Self-pay | Admitting: Internal Medicine

## 2012-11-13 ENCOUNTER — Ambulatory Visit (AMBULATORY_SURGERY_CENTER): Payer: BC Managed Care – PPO | Admitting: *Deleted

## 2012-11-13 ENCOUNTER — Encounter: Payer: Self-pay | Admitting: Gastroenterology

## 2012-11-13 VITALS — Ht 62.0 in | Wt 141.6 lb

## 2012-11-13 DIAGNOSIS — Z1211 Encounter for screening for malignant neoplasm of colon: Secondary | ICD-10-CM

## 2012-11-13 DIAGNOSIS — K219 Gastro-esophageal reflux disease without esophagitis: Secondary | ICD-10-CM

## 2012-11-13 MED ORDER — MOVIPREP 100 G PO SOLR: ORAL | Status: DC

## 2012-11-14 ENCOUNTER — Other Ambulatory Visit: Payer: Self-pay | Admitting: Internal Medicine

## 2012-11-27 ENCOUNTER — Encounter: Payer: Self-pay | Admitting: Gastroenterology

## 2012-11-27 ENCOUNTER — Ambulatory Visit (AMBULATORY_SURGERY_CENTER): Payer: BC Managed Care – PPO | Admitting: Gastroenterology

## 2012-11-27 VITALS — BP 119/77 | HR 53 | Temp 97.5°F | Resp 23 | Ht 62.0 in | Wt 141.0 lb

## 2012-11-27 DIAGNOSIS — D126 Benign neoplasm of colon, unspecified: Secondary | ICD-10-CM

## 2012-11-27 DIAGNOSIS — Z1211 Encounter for screening for malignant neoplasm of colon: Secondary | ICD-10-CM

## 2012-11-27 DIAGNOSIS — K219 Gastro-esophageal reflux disease without esophagitis: Secondary | ICD-10-CM

## 2012-11-27 MED ORDER — SODIUM CHLORIDE 0.9 % IV SOLN: 500.0000 mL | INTRAVENOUS | Status: DC

## 2012-11-27 NOTE — Op Note (Signed)
[facility] [address] [place], [address]   ENDOSCOPY PROCEDURE REPORT  PATIENT: Woods, Katrina  MR#: [id number] BIRTHDATE: 1956/04/02 , 56  yrs. old GENDER: Female ENDOSCOPIST: Meryl Dare, MD, Clementeen Graham REFERRED BY:  Duncan Dull, M.D. PROCEDURE DATE:  11/27/2012 PROCEDURE:  EGD, diagnostic ASA CLASS:     Class II INDICATIONS:  History of esophageal reflux. MEDICATIONS: There was residual sedation effect present from prior procedure, MAC sedation, administered by CRNA, and propofol (Diprivan) 60mg  IV TOPICAL ANESTHETIC: Cetacaine Spray DESCRIPTION OF PROCEDURE: After the risks benefits and alternatives of the procedure were thoroughly explained, informed consent was obtained.  The LB [id number] endoscope was introduced through the mouth and advanced to the second portion of the duodenum. Without limitations.  The instrument was slowly withdrawn as the mucosa was fully examined.  ESOPHAGUS: There was LA Class B esophagitis noted in the distal esophagus.   The esophagus was otherwise normal. STOMACH: The mucosa of the stomach appeared normal. DUODENUM: The duodenal mucosa showed no abnormalities in the bulb and second portion of the duodenum.  Retroflexed views revealed a 4 cm hiatal hernia.    The scope was then withdrawn from the patient and the procedure completed.  COMPLICATIONS: There were no complications. ENDOSCOPIC IMPRESSION: 1.   LA Class B esophagitis 2.   Hiatal hernia  RECOMMENDATIONS: 1.  Anti-reflux regimen 2.  PPI qam    eSigned:  Meryl Dare, MD, [facility] 11/27/2012 9:19 AM

## 2012-11-27 NOTE — Patient Instructions (Addendum)
Discharge instructions given with verbal understanding. Handouts on polyps,hemorrhoids,esopagitis, and a hiatal hernia given. Resume previous medications. YOU HAD AN ENDOSCOPIC PROCEDURE TODAY AT THE  ENDOSCOPY CENTER: Refer to the procedure report that was given to you for any specific questions about what was found during the examination.  If the procedure report does not answer your questions, please call your gastroenterologist to clarify.  If you requested that your care partner not be given the details of your procedure findings, then the procedure report has been included in a sealed envelope for you to review at your convenience later.  YOU SHOULD EXPECT: Some feelings of bloating in the abdomen. Passage of more gas than usual.  Walking can help get rid of the air that was put into your GI tract during the procedure and reduce the bloating. If you had a lower endoscopy (such as a colonoscopy or flexible sigmoidoscopy) you may notice spotting of blood in your stool or on the toilet paper. If you underwent a bowel prep for your procedure, then you may not have a normal bowel movement for a few days.  DIET: Your first meal following the procedure should be a light meal and then it is ok to progress to your normal diet.  A half-sandwich or bowl of soup is an example of a good first meal.  Heavy or fried foods are harder to digest and may make you feel nauseous or bloated.  Likewise meals heavy in dairy and vegetables can cause extra gas to form and this can also increase the bloating.  Drink plenty of fluids but you should avoid alcoholic beverages for 24 hours.  ACTIVITY: Your care partner should take you home directly after the procedure.  You should plan to take it easy, moving slowly for the rest of the day.  You can resume normal activity the day after the procedure however you should NOT DRIVE or use heavy machinery for 24 hours (because of the sedation medicines used during the test).     SYMPTOMS TO REPORT IMMEDIATELY: A gastroenterologist can be reached at any hour.  During normal business hours, 8:30 AM to 5:00 PM Monday through Friday, call [phone].  After hours and on weekends, please call the GI answering service at [phone] who will take a message and have the physician on call contact you.   Following lower endoscopy (colonoscopy or flexible sigmoidoscopy):  Excessive amounts of blood in the stool  Significant tenderness or worsening of abdominal pains  Swelling of the abdomen that is new, acute  Fever of 100F or higher  Following upper endoscopy (EGD)  Vomiting of blood or coffee ground material  New chest pain or pain under the shoulder blades  Painful or persistently difficult swallowing  New shortness of breath  Fever of 100F or higher  Black, tarry-looking stools  FOLLOW UP: If any biopsies were taken you will be contacted by phone or by letter within the next 1-3 weeks.  Call your gastroenterologist if you have not heard about the biopsies in 3 weeks.  Our staff will call the home number listed on your records the next business day following your procedure to check on you and address any questions or concerns that you may have at that time regarding the information given to you following your procedure. This is a courtesy call and so if there is no answer at the home number and we have not heard from you through the emergency physician on call, we will assume  that you have returned to your regular daily activities without incident.  SIGNATURES/CONFIDENTIALITY: You and/or your care partner have signed paperwork which will be entered into your electronic medical record.  These signatures attest to the fact that that the information above on your After Visit Summary has been reviewed and is understood.  Full responsibility of the confidentiality of this discharge information lies with you and/or your care-partner.

## 2012-11-27 NOTE — Op Note (Signed)
[facility] [address] [place], [address]   COLONOSCOPY PROCEDURE REPORT  PATIENT: Katrina Woods, Katrina Woods  MR#: [id number] BIRTHDATE: November 21, 1956 , 56  yrs. old GENDER: Female ENDOSCOPIST: Meryl Dare, MD, [facility] REFERRED XB:[id number] Darrick Huntsman, M.D. PROCEDURE DATE:  11/27/2012 PROCEDURE:   Colonoscopy with snare polypectomy ASA CLASS:   Class II INDICATIONS:average risk screening. MEDICATIONS: MAC sedation, administered by CRNA and propofol (Diprivan) 280mg  IV DESCRIPTION OF PROCEDURE:   After the risks benefits and alternatives of the procedure were thoroughly explained, informed consent was obtained.  A digital rectal exam revealed no abnormalities of the rectum.   The LB [id number]  endoscope was introduced through the anus and advanced to the cecum, which was identified by both the appendix and ileocecal valve. No adverse events experienced.   The quality of the prep was excellent, using MoviPrep  The instrument was then slowly withdrawn as the colon was fully examined.  COLON FINDINGS: Three sessile polyps measuring 5 mm in size were found in the ascending colon, transverse colon, and descending colon.  A polypectomy was performed with a cold snare.  The resection was complete and the polyp tissue was completely retrieved.   A sessile polyp measuring 4 mm in size was found in the sigmoid colon.  A polypectomy was performed with a cold snare. The resection was complete and the polyp tissue was completely retrieved.   The colon was otherwise normal.  There was no diverticulosis, inflammation, polyps or cancers unless previously stated.  Retroflexed views revealed internal hemorrhoids. The time to cecum=3 minutes 38 seconds.  Withdrawal time=12 minutes 32 seconds.  The scope was withdrawn and the procedure completed. COMPLICATIONS: There were no complications.  ENDOSCOPIC IMPRESSION: 1.   Three sessile polyps measuring 5 mm in the ascending  colon, transverse colon, and descending colon; polypectomy was performed with a cold snare 2.   Sessile polyp measuring 4 mm in the sigmoid colon; polypectomy performed with a cold snare 3.   Internal hemorrhoids  RECOMMENDATIONS: 1.  Await pathology results 2.  Repeat colonoscopy in 5 years if polyp adenomatous; otherwise 10 years   eSigned:  Meryl Dare, MD, [facility] 11/27/2012 9:14 AM

## 2012-11-27 NOTE — Progress Notes (Signed)
[id number] a/ox3 pleased with MAC report to [facility]

## 2012-11-27 NOTE — Progress Notes (Signed)
Patient did not experience any of the following events: a burn prior to discharge; a fall within the facility; wrong site/side/patient/procedure/implant event; or a hospital transfer or hospital admission upon discharge from the facility. (G8907) Patient did not have preoperative order for IV antibiotic SSI prophylaxis. (G8918)  

## 2012-11-28 ENCOUNTER — Telehealth: Payer: Self-pay

## 2012-11-28 NOTE — Telephone Encounter (Signed)
  Follow up Call-  Call back number 11/27/2012  Post procedure Call Back phone  # [phone]  Permission to leave phone message Yes     Patient questions:  Do you have a fever, pain , or abdominal swelling? no Pain Score  0 *  Have you tolerated food without any problems? yes  Have you been able to return to your normal activities? yes  Do you have any questions about your discharge instructions: Diet   no Medications  no Follow up visit  no  Do you have questions or concerns about your Care? no  Actions: * If pain score is 4 or above: No action needed, pain <4.

## 2012-12-03 DIAGNOSIS — D126 Benign neoplasm of colon, unspecified: Secondary | ICD-10-CM | POA: Insufficient documentation

## 2012-12-05 ENCOUNTER — Encounter: Payer: Self-pay | Admitting: Gastroenterology

## 2012-12-17 ENCOUNTER — Encounter: Payer: BC Managed Care – PPO | Admitting: Gastroenterology

## 2013-01-06 ENCOUNTER — Encounter: Payer: Self-pay | Admitting: Internal Medicine

## 2013-01-06 ENCOUNTER — Ambulatory Visit (INDEPENDENT_AMBULATORY_CARE_PROVIDER_SITE_OTHER): Payer: BC Managed Care – PPO | Admitting: Internal Medicine

## 2013-01-06 ENCOUNTER — Telehealth: Payer: Self-pay | Admitting: Internal Medicine

## 2013-01-06 VITALS — BP 140/80 | HR 98 | Temp 99.4°F | Resp 17 | Wt 140.5 lb

## 2013-01-06 DIAGNOSIS — J1108 Influenza due to unidentified influenza virus with specified pneumonia: Secondary | ICD-10-CM | POA: Insufficient documentation

## 2013-01-06 DIAGNOSIS — D696 Thrombocytopenia, unspecified: Secondary | ICD-10-CM

## 2013-01-06 DIAGNOSIS — R05 Cough: Secondary | ICD-10-CM

## 2013-01-06 DIAGNOSIS — J180 Bronchopneumonia, unspecified organism: Secondary | ICD-10-CM | POA: Insufficient documentation

## 2013-01-06 DIAGNOSIS — J1289 Other viral pneumonia: Secondary | ICD-10-CM

## 2013-01-06 DIAGNOSIS — J1100 Influenza due to unidentified influenza virus with unspecified type of pneumonia: Secondary | ICD-10-CM

## 2013-01-06 DIAGNOSIS — R059 Cough, unspecified: Secondary | ICD-10-CM

## 2013-01-06 LAB — POCT INFLUENZA A/B: Influenza B, POC: POSITIVE

## 2013-01-06 MED ORDER — LEVOFLOXACIN 500 MG PO TABS: 500.0000 mg | ORAL_TABLET | Freq: Every day | ORAL | Status: DC

## 2013-01-06 MED ORDER — OSELTAMIVIR PHOSPHATE 75 MG PO CAPS: 75.0000 mg | ORAL_CAPSULE | Freq: Two times a day (BID) | ORAL | Status: DC

## 2013-01-06 MED ORDER — PROMETHAZINE HCL 25 MG/ML IJ SOLN
25.0000 mg | Freq: Once | INTRAMUSCULAR | Status: AC
  Administered 2013-01-06: 25 mg via INTRAMUSCULAR

## 2013-01-06 MED ORDER — HYDROCODONE-HOMATROPINE 5-1.5 MG/5ML PO SYRP: 5.0000 mL | ORAL_SOLUTION | Freq: Three times a day (TID) | ORAL | Status: DC | PRN

## 2013-01-06 MED ORDER — PROMETHAZINE HCL 25 MG PO TABS: 25.0000 mg | ORAL_TABLET | Freq: Three times a day (TID) | ORAL | Status: DC | PRN

## 2013-01-06 NOTE — Telephone Encounter (Signed)
FYI: Patient is scheduled today to see you.

## 2013-01-06 NOTE — Assessment & Plan Note (Addendum)
Rapid influenza test was positive for A and B.  Will treat with Tamiflu, phenergan, hycodan,  Adding levaquin given high risk of PNA secondary to tobacco abuse.  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 Several masks given Work note faxed to [facility]

## 2013-01-06 NOTE — Patient Instructions (Addendum)
You have the flu.  You will need to stay out of work the rest of the week,  Start the Tamiflu ASAP and take the entire bottle ( 5 days )   Start the levaquin (antibiotic)  if you develop a productive cough or if you spike a fever after Wednesday.  Use OTC mucinex to break up the phlegm and the hycodan cough syrup every 8 hours for cough control  Ibuprofen 600 mg and tylenol 500 mg every 6 hours for muscel and body aches

## 2013-01-06 NOTE — Telephone Encounter (Signed)
Patient Information:  Caller Name: Naviah  Phone: [phone]  Patient: Katrina Woods, Katrina Woods  Gender: Female  DOB: 1956/11/12  Age: 57 Years  PCP: Duncan Dull (Adults only)  Office Follow Up:  Does the office need to follow up with this patient?: No  Instructions For The Office: N/A  RN Note:  Patient states she developed cough, congestion, headache, fever, aching. States has been taking Advil p.m. with only brief improvement. Patient had Advil P.M. 2 tablets at 0200 01/06/13. Patient taking fluids well. Care advice given per guidelines. Call back parameters reviewed. Patient verbalzies understanding.  Symptoms  Reason For Call & Symptoms: cough, congestion, fever, aches Meds: Omeprazole, Escitalopram, Tramadol Hx: GERD, Anxiety/Depression Allergies: Statins  Reviewed Health History In EMR: Yes  Reviewed Medications In EMR: Yes  Reviewed Allergies In EMR: Yes  Reviewed Surgeries / Procedures: Yes  Date of Onset of Symptoms: 01/05/2013  Treatments Tried: Advil p.m.  Treatments Tried Worked: No  Any Fever: Yes  Fever Taken: Oral  Fever Time Of Reading: 07:30:00  Fever Last Reading: 101  Guideline(s) Used:  Influenza - Seasonal  Disposition Per Guideline:   See Today or Tomorrow in Office  Reason For Disposition Reached:   Patient wants to be seen  Advice Given:  Call Back If:  You become short of breath or worse.  For a Stuffy Nose - Use Nasal Washes:  Introduction: Saline (salt water) nasal irrigation (nasal wash) is an effective and simple home remedy for treating stuffy nose and sinus congestion. The nose can be irrigated by pouring, spraying, or squirting salt water into the nose and then letting it run back out.  Pain and Fever Medicines:  For pain or fever relief, take either acetaminophen or ibuprofen.  Pain and Fever Medicines:  For pain or fever relief, take either acetaminophen or ibuprofen.  Ibuprofen (e.g., Motrin, Advil):  Another choice is to take 600 mg  (three 200 mg pills) by mouth every 8 hours.  Appointment Scheduled:  01/06/2013 09:30:00 Appointment Scheduled Provider:  Duncan Dull (Adults only)

## 2013-01-06 NOTE — Progress Notes (Signed)
Patient ID: Katrina Woods, female   DOB: 1956/09/03, 57 y.o.   MRN: [id number]    Patient Active Problem List  Diagnosis  . Bursitis of left hip  . Hiatal hernia  . Hyperlipidemia  . Aneurysm  . Tobacco abuse  . Depression  . Back pain, thoracic  . Abdominal pain, chronic, right upper quadrant  . GERD (gastroesophageal reflux disease)  . Tubular adenoma of colon  . Influenza with bronchopneumonia    Subjective:  CC:   Chief Complaint  Patient presents with  . Cough    unproductive    HPI:   Katrina Schuelke Smithis a 58 y.o. female who presents Sudden onset of nonproductive cough, severe diffuse myalgias,  Nausea.  Patient woke up 2 am Monday with cough and aching all over accompanied by a headache.  No nuchal rigidity or neck pain,  No confusion.  Deep pleuritic cough.  Feels the worst she has ever felt from an illness.  No vomiting or diarrhea. Had the flu vaccine .  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  She has been in the waiting room and throughout the clinic without wearing a mask despite the signs posted in the waiting room.    Past Medical History  Diagnosis Date  . Bursitis of left hip 8-13    takes Meloxicam  . Chicken pox as a child  . Measles as a child  . [place] measles as a child  . Mumps as a child  . Hiatal hernia 209  . Hyperlipidemia   . 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 Endometriosis     Past Surgical History  Procedure Date  . Abdominal hysterectomy 2004    total  . Knee cartilage surgery 1987    Left knee- torn cartlidge  . Cesarean section 11-25-82  . Wisdom tooth extraction 1986    all four extracted  . Cerebral aneurysm repair 2006    Deveshwar, coil          The following portions of the patient's history were reviewed and updated as appropriate: Allergies, current medications, and problem list.    Review of Systems:   12 Pt  review of systems  was negative except those addressed in the HPI,     History   Social History  . Marital Status: Single    Spouse Name: N/A    Number of Children: N/A  . Years of Education: N/A   Occupational History  . Not on file.   Social History Main Topics  . Smoking status: Current Every Day Smoker -- 0.5 packs/day for 28 years    Types: Cigarettes  . Smokeless tobacco: Never Used  . Alcohol Use: Yes     Comment: occasional  . Drug Use: No  . Sexually Active: Not Currently   Other Topics Concern  . Not on file   Social History Narrative  . No narrative on file    Objective:  BP 140/80  Pulse 98  Temp 99.4 F (37.4 C) (Oral)  Resp 17  Wt 140 lb 8 oz (63.73 kg)  SpO2 99%  General appearance: patient is uncomfortable, appears ill Ears: normal TM's and external ear canals both ears Throat: lips, mucosa, and tongue normal; teeth and gums normal Neck: cervical tender adenopathy, no carotid bruit, supple, symmetrical, trachea midline and thyroid not enlarged. No nuchal rigidity Back: symmetric, no curvature. ROM  normal. No CVA tenderness. Lungs: clear to auscultation bilaterally Heart: regular rate and rhythm, S1, S2 normal, no murmur, click, rub or gallop Abdomen: soft, non-tender; bowel sounds normal; no masses,  no organomegaly Pulses: 2+ and symmetric  Assessment and Plan:  Influenza with bronchopneumonia Rapid influenza test was positive for A and B.  Will treat with Tamiflu, phenergan, hycodan,  Adding levaquin given high risk of PNA secondary to tobacco abuse.  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 Several masks given Work note faxed to [facility]  Thrombocytopenia Patient was advised of low platelet count in November and instructed to stop any aspirin products and return for repeat labs in one monht, which has not occurred.  This will be done next week along with LDH.    Updated Medication  List Outpatient Encounter Prescriptions as of 01/06/2013  Medication Sig Dispense Refill  . ALPRAZolam (XANAX) 0.5 MG tablet Take 1 tablet (0.5 mg total) by mouth at bedtime as needed for sleep.  30 tablet  3  . escitalopram (LEXAPRO) 5 MG tablet TAKE 1 TABLET BY MOUTH EVERY DAY  30 tablet  1  . esomeprazole (NEXIUM) 40 MG capsule Take 1 capsule (40 mg total) by mouth daily.  30 capsule  3  . ibuprofen (ADVIL,MOTRIN) 200 MG tablet Take 400 mg by mouth every 8 (eight) hours as needed.      . meloxicam (MOBIC) 15 MG tablet Take 15 mg by mouth daily.      . traMADol (ULTRAM) 50 MG tablet Take 1 tablet (50 mg total) by mouth every 8 (eight) hours as needed.  60 tablet  3  . HYDROcodone-homatropine (HYCODAN) 5-1.5 MG/5ML syrup Take 5 mLs by mouth every 8 (eight) hours as needed for cough.  120 mL  0  . levofloxacin (LEVAQUIN) 500 MG tablet Take 1 tablet (500 mg total) by mouth daily.  7 tablet  0  . oseltamivir (TAMIFLU) 75 MG capsule Take 1 capsule (75 mg total) by mouth 2 (two) times daily.  10 capsule  0  . promethazine (PHENERGAN) 25 MG tablet Take 1 tablet (25 mg total) by mouth every 8 (eight) hours as needed for nausea.  20 tablet  0  . promethazine (PHENERGAN) injection 25 mg

## 2013-01-07 ENCOUNTER — Telehealth: Payer: Self-pay | Admitting: Internal Medicine

## 2013-01-07 ENCOUNTER — Encounter: Payer: Self-pay | Admitting: Internal Medicine

## 2013-01-07 DIAGNOSIS — D696 Thrombocytopenia, unspecified: Secondary | ICD-10-CM | POA: Insufficient documentation

## 2013-01-07 NOTE — Telephone Encounter (Signed)
Please call patient to check on her status (flu, diagnosed yesterday) and make her a follow up appt for next week.  Please also remind her that she was supposed to return last month to recheck her low platelet count that was found in November   We will do that next week when she returns.

## 2013-01-07 NOTE — Telephone Encounter (Signed)
Pt is scheduled for appt  

## 2013-01-07 NOTE — Assessment & Plan Note (Signed)
Patient was advised of low platelet count in November and instructed to stop any aspirin products and return for repeat labs in one monht, which has not occurred.  This will be done next week along with LDH.

## 2013-01-09 ENCOUNTER — Telehealth: Payer: Self-pay | Admitting: Internal Medicine

## 2013-01-09 NOTE — Telephone Encounter (Signed)
Pt notified. This was faxed.

## 2013-01-09 NOTE — Telephone Encounter (Signed)
Pt was seen on Tuesday by Dr.Tullo and Dr. Darrick Huntsman was going to fax over a note to her work for the rest of this week and she was going to come back this coming Tuesday to see her. Pt was not sure of the dates the notes need to be written. Pt was diagnosed on the flu. Please fax to number is [phone]. Please call pt when faxed over.

## 2013-01-13 ENCOUNTER — Ambulatory Visit (INDEPENDENT_AMBULATORY_CARE_PROVIDER_SITE_OTHER): Payer: BC Managed Care – PPO | Admitting: Internal Medicine

## 2013-01-13 ENCOUNTER — Encounter: Payer: Self-pay | Admitting: Internal Medicine

## 2013-01-13 VITALS — BP 120/74 | HR 63 | Temp 97.8°F | Resp 16 | Wt 143.8 lb

## 2013-01-13 DIAGNOSIS — J1108 Influenza due to unidentified influenza virus with specified pneumonia: Secondary | ICD-10-CM

## 2013-01-13 DIAGNOSIS — J441 Chronic obstructive pulmonary disease with (acute) exacerbation: Secondary | ICD-10-CM

## 2013-01-13 DIAGNOSIS — J1289 Other viral pneumonia: Secondary | ICD-10-CM

## 2013-01-13 DIAGNOSIS — J1100 Influenza due to unidentified influenza virus with unspecified type of pneumonia: Secondary | ICD-10-CM

## 2013-01-13 DIAGNOSIS — J180 Bronchopneumonia, unspecified organism: Secondary | ICD-10-CM

## 2013-01-13 MED ORDER — PREDNISONE (PAK) 10 MG PO TABS: ORAL_TABLET | ORAL | Status: DC

## 2013-01-13 MED ORDER — METHYLPREDNISOLONE ACETATE 40 MG/ML IJ SUSP
40.0000 mg | Freq: Once | INTRAMUSCULAR | Status: AC
  Administered 2013-01-13: 40 mg via INTRAMUSCULAR

## 2013-01-13 MED ORDER — ALBUTEROL SULFATE (2.5 MG/3ML) 0.083% IN NEBU
2.5000 mg | INHALATION_SOLUTION | Freq: Once | RESPIRATORY_TRACT | Status: AC
  Administered 2013-01-13: 2.5 mg via RESPIRATORY_TRACT

## 2013-01-13 MED ORDER — BUDESONIDE-FORMOTEROL FUMARATE 160-4.5 MCG/ACT IN AERO: 2.0000 | INHALATION_SPRAY | Freq: Two times a day (BID) | RESPIRATORY_TRACT | Status: DC

## 2013-01-13 NOTE — Patient Instructions (Addendum)
You have an ear infection, along with bronchitis with wheezing   You need to start sudafed PE 10 to 30 mg every 6 hours to relieve the pressure in your ears and sinuses (this is OTC) .   I have given you a breathing treatment of albuterol and a shot of steroids.  Use the symbicort inhaler,  2 puffs twice daily for the next week or until gone (2 weeks of sample).  This will help dilate your airways.   I am adding a prednisone taper  To start tomorrow (6 tablets on day 1,  Reduce by 1 tablet daily until gone)  Start the levaquin today and use the cough syrup as well  As needed

## 2013-01-14 ENCOUNTER — Encounter: Payer: Self-pay | Admitting: Internal Medicine

## 2013-01-14 DIAGNOSIS — J441 Chronic obstructive pulmonary disease with (acute) exacerbation: Secondary | ICD-10-CM | POA: Insufficient documentation

## 2013-01-14 NOTE — Assessment & Plan Note (Signed)
With mild expiratory wheezes on exam. No hypoxia. Productive cough still present. Levaquin and prednisone prescribed.  Symbicort inhaler given patient was given a dose of IM steroids and an albuterol nebulizer in the office before she left. She should remain out of work for an additional 2 days.

## 2013-01-14 NOTE — Assessment & Plan Note (Signed)
The nausea and fevers have resolved. Her she did not receive antibiotics that were prescribed last week so she can only Tamiflu. We are starting Levaquin today.

## 2013-01-14 NOTE — Progress Notes (Signed)
Patient ID: Katrina Woods, female   DOB: 1956/04/30, 57 y.o.   MRN: [id number]   Patient Active Problem List  Diagnosis  . Bursitis of left hip  . Hiatal hernia  . Hyperlipidemia  . Aneurysm  . Tobacco abuse  . Depression  . Back pain, thoracic  . Abdominal pain, chronic, right upper quadrant  . GERD (gastroesophageal reflux disease)  . Tubular adenoma of colon  . Influenza with bronchopneumonia  . Thrombocytopenia  . COPD exacerbation    Subjective:  CC:   Chief Complaint  Patient presents with  . Follow-up    FLU    HPI:   Katrina Ting Smithis a 57 y.o. female who presents for followup on recent diagnosis of influenza . she's feeling somewhat better. She took the five-day course of Tamiflu. However she never picked up the Levaquin for the cough suppressant and continues to have productive cough and is now wheezing.  She is no longer febrile . She returned to today but felt miserable.   Past Medical History  Diagnosis Date  . Bursitis of left hip 8-13    takes Meloxicam  . Chicken pox as a child  . Measles as a child  . [place] measles as a child  . Mumps as a child  . Hiatal hernia 209  . Hyperlipidemia   . 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 Endometriosis     Past Surgical History  Procedure Date  . Abdominal hysterectomy 2004    total  . Knee cartilage surgery 1987    Left knee- torn cartlidge  . Cesarean section 11-25-82  . Wisdom tooth extraction 1986    all four extracted  . Cerebral aneurysm repair 2006    Deveshwar, coil          The following portions of the patient's history were reviewed and updated as appropriate: Allergies, current medications, and problem list.    Review of Systems:   Patient denies headache, fevers,  unintentional weight loss, skin rash, eye pain, sinus congestion and sinus pain, sore throat, dysphagia,  hemoptysis ,  chest pain, palpitations, orthopnea, edema, abdominal pain, nausea,  melena, diarrhea, constipation, flank pain, dysuria, hematuria, urinary  Frequency, nocturia, numbness, tingling, seizures,  Focal weakness, Loss of consciousness,  Tremor, insomnia, depression, anxiety, and suicidal ideation.         History   Social History  . Marital Status: Single    Spouse Name: N/A    Number of Children: N/A  . Years of Education: N/A   Occupational History  . Not on file.   Social History Main Topics  . Smoking status: Current Every Day Smoker -- 0.5 packs/day for 28 years    Types: Cigarettes  . Smokeless tobacco: Never Used  . Alcohol Use: Yes     Comment: occasional  . Drug Use: No  . Sexually Active: Not Currently   Other Topics Concern  . Not on file   Social History Narrative  . No narrative on file    Objective:  BP 120/74  Pulse 63  Temp 97.8 F (36.6 C) (Oral)  Resp 16  Wt 143 lb 12 oz (65.205 kg)  SpO2 98%  General appearance: appears ill, alert, cooperative and appears stated age Ears: normal TM's and external ear canals both ears Throat: lips, mucosa, and tongue normal; teeth and gums normal Neck: no adenopathy, no carotid bruit, supple, symmetrical, trachea midline and thyroid not enlarged, symmetric,  no tenderness/mass/nodules Back: symmetric, no curvature. ROM normal. No CVA tenderness. Lungs: bilateral ronchi and expiratory wheezes, fair air movement Heart: regular rate and rhythm, S1, S2 normal, no murmur, click, rub or gallop Abdomen: soft, non-tender; bowel sounds normal; no masses,  no organomegaly Pulses: 2+ and symmetric Skin: Skin color, texture, turgor normal. No rashes or lesions Lymph nodes: Cervical, supraclavicular, and axillary nodes normal.  Assessment and Plan:  COPD exacerbation With mild expiratory wheezes on exam. No hypoxia. Productive cough still present. Levaquin and prednisone prescribed.  Symbicort inhaler given patient was given a dose of IM steroids and an albuterol nebulizer in the office  before she left. She should remain out of work for an additional 2 days.  Influenza with bronchopneumonia The nausea and fevers have resolved. Her she did not receive antibiotics that were prescribed last week so she can only Tamiflu. We are starting Levaquin today.   Updated Medication List Outpatient Encounter Prescriptions as of 01/13/2013  Medication Sig Dispense Refill  . ALPRAZolam (XANAX) 0.5 MG tablet Take 1 tablet (0.5 mg total) by mouth at bedtime as needed for sleep.  30 tablet  3  . escitalopram (LEXAPRO) 5 MG tablet TAKE 1 TABLET BY MOUTH EVERY DAY  30 tablet  1  . esomeprazole (NEXIUM) 40 MG capsule Take 1 capsule (40 mg total) by mouth daily.  30 capsule  3  . ibuprofen (ADVIL,MOTRIN) 200 MG tablet Take 400 mg by mouth every 8 (eight) hours as needed.      . meloxicam (MOBIC) 15 MG tablet Take 15 mg by mouth daily.      Marland Kitchen omeprazole (PRILOSEC) 40 MG capsule       . oseltamivir (TAMIFLU) 75 MG capsule Take 1 capsule (75 mg total) by mouth 2 (two) times daily.  10 capsule  0  . promethazine (PHENERGAN) 25 MG tablet Take 1 tablet (25 mg total) by mouth every 8 (eight) hours as needed for nausea.  20 tablet  0  . traMADol (ULTRAM) 50 MG tablet Take 1 tablet (50 mg total) by mouth every 8 (eight) hours as needed.  60 tablet  3  . budesonide-formoterol (SYMBICORT) 160-4.5 MCG/ACT inhaler Inhale 2 puffs into the lungs 2 (two) times daily.  1 Inhaler  12  . HYDROcodone-homatropine (HYCODAN) 5-1.5 MG/5ML syrup Take 5 mLs by mouth every 8 (eight) hours as needed for cough.  120 mL  0  . levofloxacin (LEVAQUIN) 500 MG tablet Take 1 tablet (500 mg total) by mouth daily.  7 tablet  0  . predniSONE (STERAPRED UNI-PAK) 10 MG tablet 6 tablets on Day 1 , then reduce by 1 tablet daily until gone  21 tablet  0  . [EXPIRED] albuterol (PROVENTIL) (2.5 MG/3ML) 0.083% nebulizer solution 2.5 mg       . [EXPIRED] methylPREDNISolone acetate (DEPO-MEDROL) injection 40 mg          No orders of the  defined types were placed in this encounter.    No Follow-up on file.

## 2013-02-16 ENCOUNTER — Encounter: Payer: BC Managed Care – PPO | Admitting: Internal Medicine

## 2013-03-05 ENCOUNTER — Other Ambulatory Visit: Payer: Self-pay | Admitting: Internal Medicine

## 2013-03-09 ENCOUNTER — Other Ambulatory Visit: Payer: Self-pay | Admitting: Internal Medicine

## 2013-03-10 NOTE — Telephone Encounter (Signed)
Med phoned in also.

## 2013-04-12 LAB — HM PAP SMEAR: HM Pap smear: NORMAL

## 2013-04-12 LAB — HM MAMMOGRAPHY: HM Mammogram: NORMAL

## 2013-04-15 ENCOUNTER — Ambulatory Visit (INDEPENDENT_AMBULATORY_CARE_PROVIDER_SITE_OTHER): Payer: BC Managed Care – PPO | Admitting: Internal Medicine

## 2013-04-15 ENCOUNTER — Other Ambulatory Visit (HOSPITAL_COMMUNITY)
Admission: RE | Admit: 2013-04-15 | Discharge: 2013-04-15 | Disposition: A | Discharge status: Home / Self Care | Payer: BC Managed Care – PPO | Source: Ambulatory Visit | Attending: Internal Medicine | Admitting: Internal Medicine

## 2013-04-15 ENCOUNTER — Encounter: Payer: Self-pay | Admitting: Internal Medicine

## 2013-04-15 VITALS — BP 108/66 | HR 64 | Temp 98.2°F | Resp 16 | Ht 62.0 in | Wt 143.0 lb

## 2013-04-15 DIAGNOSIS — K862 Cyst of pancreas: Secondary | ICD-10-CM

## 2013-04-15 DIAGNOSIS — G8929 Other chronic pain: Secondary | ICD-10-CM

## 2013-04-15 DIAGNOSIS — R1011 Right upper quadrant pain: Secondary | ICD-10-CM

## 2013-04-15 DIAGNOSIS — I729 Aneurysm of unspecified site: Secondary | ICD-10-CM

## 2013-04-15 DIAGNOSIS — Z1239 Encounter for other screening for malignant neoplasm of breast: Secondary | ICD-10-CM

## 2013-04-15 DIAGNOSIS — E559 Vitamin D deficiency, unspecified: Secondary | ICD-10-CM

## 2013-04-15 DIAGNOSIS — Z0000 Encounter for general adult medical examination without abnormal findings: Secondary | ICD-10-CM

## 2013-04-15 DIAGNOSIS — R5381 Other malaise: Secondary | ICD-10-CM

## 2013-04-15 DIAGNOSIS — E538 Deficiency of other specified B group vitamins: Secondary | ICD-10-CM

## 2013-04-15 DIAGNOSIS — Z124 Encounter for screening for malignant neoplasm of cervix: Secondary | ICD-10-CM

## 2013-04-15 DIAGNOSIS — R5383 Other fatigue: Secondary | ICD-10-CM

## 2013-04-15 DIAGNOSIS — Z1151 Encounter for screening for human papillomavirus (HPV): Secondary | ICD-10-CM | POA: Insufficient documentation

## 2013-04-15 DIAGNOSIS — D696 Thrombocytopenia, unspecified: Secondary | ICD-10-CM

## 2013-04-15 DIAGNOSIS — Z01419 Encounter for gynecological examination (general) (routine) without abnormal findings: Secondary | ICD-10-CM | POA: Insufficient documentation

## 2013-04-15 DIAGNOSIS — E785 Hyperlipidemia, unspecified: Secondary | ICD-10-CM

## 2013-04-15 DIAGNOSIS — Z23 Encounter for immunization: Secondary | ICD-10-CM

## 2013-04-15 MED ORDER — ALPRAZOLAM 1 MG PO TABS: 1.0000 mg | ORAL_TABLET | Freq: Every evening | ORAL | Status: DC | PRN

## 2013-04-15 NOTE — Patient Instructions (Addendum)
We are going to get your mammogram ordered, and your CT to repeat the imaging of your pancreas.  Return for fasting labs and urinalysis (be sure to drink water)  If you want to see Dr. Shirlyn Goltz,  Let me know and we will make the referral.

## 2013-04-15 NOTE — Progress Notes (Signed)
Patient ID: Katrina Woods, female   DOB: 12/14/1955, 57 y.o.   MRN: [id number]   Subjective:     Katrina Woods is a 58 y.o. female and is here for a comprehensive physical exam. The patient reports lack of energy since she had the flu in February.  She is still having the right sided pain that was worked up in the past   And now needs follow up on CT incidental finding of pancreatic cyst , 4 mm seen on prior imaging.    History   Social History  . Marital Status: Single    Spouse Name: N/A    Number of Children: N/A  . Years of Education: N/A   Occupational History  . Not on file.   Social History Main Topics  . Smoking status: Current Every Day Smoker -- 0.50 packs/day for 28 years    Types: Cigarettes  . Smokeless tobacco: Never Used  . Alcohol Use: Yes     Comment: occasional  . Drug Use: No  . Sexually Active: Not Currently   Other Topics Concern  . Not on file   Social History Narrative  . No narrative on file   Health Maintenance  Topic Date Due  . Mammogram  11/12/2005  . Pap Smear  12/11/2007  . Influenza Vaccine  08/10/2013  . Colonoscopy  11/27/2017  . Tetanus/tdap  04/16/2023    The following portions of the patient's history were reviewed and updated as appropriate: allergies, current medications, past family history, past medical history, past social history, past surgical history and problem list.  Review of Systems A comprehensive review of systems was negative.   Objective:   BP 108/66  Pulse 64  Temp(Src) 98.2 F (36.8 C) (Oral)  Resp 16  Ht 5\' 2"  (1.575 m)  Wt 143 lb (64.864 kg)  BMI 26.15 kg/m2  SpO2 100%  General Appearance:    Alert, cooperative, no distress, appears stated age  Head:    Normocephalic, without obvious abnormality, atraumatic  Eyes:    PERRL, conjunctiva/corneas clear, EOM's intact, fundi    benign, both eyes  Ears:    Normal TM's and external ear canals, both ears  Nose:   Nares normal, septum midline, mucosa  normal, no drainage    or sinus tenderness  Throat:   Lips, mucosa, and tongue normal; teeth and gums normal  Neck:   Supple, symmetrical, trachea midline, no adenopathy;    thyroid:  no enlargement/tenderness/nodules; no carotid   bruit or JVD  Back:     Symmetric, no curvature, ROM normal, no CVA tenderness  Lungs:     Clear to auscultation bilaterally, respirations unlabored  Chest Wall:    No tenderness or deformity   Heart:    Regular rate and rhythm, S1 and S2 normal, no murmur, rub   or gallop  Breast Exam:    No tenderness, masses, or nipple abnormality  Abdomen:     Soft, non-tender, bowel sounds active all four quadrants,    no masses, no organomegaly  Genitalia:    Pelvic: cervix normal in appearance, external genitalia normal, no adnexal masses or tenderness, no cervical motion tenderness, rectovaginal septum normal, uterus normal size, shape, and consistency and vagina normal without discharge  Extremities:   Extremities normal, atraumatic, no cyanosis or edema  Pulses:   2+ and symmetric all extremities  Skin:   Skin color, texture, turgor normal, no rashes or lesions  Lymph nodes:   Cervical, supraclavicular, and axillary  nodes normal  Neurologic:   CNII-XII intact, normal strength, sensation and reflexes    throughout   Assessment:   Abdominal pain, chronic, right upper quadrant Etiology unclear. Her gallbladder had no evidence of stones and hydroscan showed normal ejection fraction. Plain thoracic spine films showed no evidence of degenerative disc changes or arthritic changes.  CT of  abdomen and pelvis shoed onbly a 4 mm pancreatic cyst.   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  Routine general medical examination at a health care facility Annual comprehensive exam was done including breast, pelvic and PAP smear. All screenings have been addressed .     Updated Medication List Outpatient Encounter Prescriptions as of  04/15/2013  Medication Sig Dispense Refill  . ALPRAZolam (XANAX) 1 MG tablet Take 1 tablet (1 mg total) by mouth at bedtime as needed for sleep.  30 tablet  3  . escitalopram (LEXAPRO) 5 MG tablet TAKE 1 TABLET BY MOUTH EVERY DAY  30 tablet  5  . meloxicam (MOBIC) 15 MG tablet Take 15 mg by mouth daily.      Marland Kitchen omeprazole (PRILOSEC) 40 MG capsule TAKE 1 CAPSULE (40 MG TOTAL) BY MOUTH DAILY.  30 capsule  3  . traMADol (ULTRAM) 50 MG tablet Take 1 tablet (50 mg total) by mouth every 8 (eight) hours as needed.  60 tablet  3  . [DISCONTINUED] ALPRAZolam (XANAX) 0.5 MG tablet Take 1 tablet (0.5 mg total) by mouth at bedtime as needed for sleep.  30 tablet  3  . budesonide-formoterol (SYMBICORT) 160-4.5 MCG/ACT inhaler Inhale 2 puffs into the lungs 2 (two) times daily.  1 Inhaler  12  . esomeprazole (NEXIUM) 40 MG capsule Take 1 capsule (40 mg total) by mouth daily.  30 capsule  3  . HYDROcodone-homatropine (HYCODAN) 5-1.5 MG/5ML syrup Take 5 mLs by mouth every 8 (eight) hours as needed for cough.  120 mL  0  . ibuprofen (ADVIL,MOTRIN) 200 MG tablet Take 400 mg by mouth every 8 (eight) hours as needed.      Marland Kitchen levofloxacin (LEVAQUIN) 500 MG tablet Take 1 tablet (500 mg total) by mouth daily.  7 tablet  0  . oseltamivir (TAMIFLU) 75 MG capsule Take 1 capsule (75 mg total) by mouth 2 (two) times daily.  10 capsule  0  . predniSONE (STERAPRED UNI-PAK) 10 MG tablet 6 tablets on Day 1 , then reduce by 1 tablet daily until gone  21 tablet  0  . promethazine (PHENERGAN) 25 MG tablet Take 1 tablet (25 mg total) by mouth every 8 (eight) hours as needed for nausea.  20 tablet  0  . [DISCONTINUED] omeprazole (PRILOSEC) 40 MG capsule        No facility-administered encounter medications on file as of 04/15/2013.

## 2013-04-17 ENCOUNTER — Encounter: Payer: Self-pay | Admitting: Internal Medicine

## 2013-04-17 DIAGNOSIS — Z0000 Encounter for general adult medical examination without abnormal findings: Secondary | ICD-10-CM | POA: Insufficient documentation

## 2013-04-17 NOTE — Assessment & Plan Note (Addendum)
Annual comprehensive exam was done including breast, pelvic and PAP smear. All screenings have been addressed .  

## 2013-04-17 NOTE — Assessment & Plan Note (Signed)
Etiology unclear. Her gallbladder had no evidence of stones and hydroscan showed normal ejection fraction. Plain thoracic spine films showed no evidence of degenerative disc changes or arthritic changes.  CT of  abdomen and pelvis shoed onbly a 4 mm pancreatic cyst.

## 2013-04-17 NOTE — Assessment & Plan Note (Addendum)
Cerebral, previously followed by Deveshwar, reminded to quit smoking.

## 2013-04-20 ENCOUNTER — Encounter: Payer: Self-pay | Admitting: Internal Medicine

## 2013-04-21 ENCOUNTER — Ambulatory Visit: Payer: Self-pay | Admitting: Internal Medicine

## 2013-04-23 ENCOUNTER — Telehealth: Payer: Self-pay | Admitting: Internal Medicine

## 2013-04-23 NOTE — Telephone Encounter (Signed)
Patient has fasting labs scheduled for next Friday 5/23, patient notified of results.

## 2013-04-23 NOTE — Telephone Encounter (Signed)
Her CT scan showed the the small (3 mm) lesion in the body of the pancreas has not changed since it was seen in November and there are no new lesions.  Gallbladder , spleen are all fine.  But liver is fatty appearing, and she has a small  5 mm hypodense lesions compatible with a cyst.  Also she has a moderate sized hiatal hernia.   Please ask her to return for the fasting labs I ordered so I can decide what to do about the fatty liver.

## 2013-05-01 ENCOUNTER — Other Ambulatory Visit (INDEPENDENT_AMBULATORY_CARE_PROVIDER_SITE_OTHER): Payer: BC Managed Care – PPO

## 2013-05-01 ENCOUNTER — Telehealth: Payer: Self-pay | Admitting: *Deleted

## 2013-05-01 DIAGNOSIS — E538 Deficiency of other specified B group vitamins: Secondary | ICD-10-CM

## 2013-05-01 DIAGNOSIS — E785 Hyperlipidemia, unspecified: Secondary | ICD-10-CM

## 2013-05-01 DIAGNOSIS — E559 Vitamin D deficiency, unspecified: Secondary | ICD-10-CM

## 2013-05-01 DIAGNOSIS — D696 Thrombocytopenia, unspecified: Secondary | ICD-10-CM

## 2013-05-01 DIAGNOSIS — R5381 Other malaise: Secondary | ICD-10-CM

## 2013-05-01 DIAGNOSIS — R5383 Other fatigue: Secondary | ICD-10-CM

## 2013-05-01 LAB — COMPREHENSIVE METABOLIC PANEL WITH GFR
ALT: 10 U/L (ref 0–35)
AST: 14 U/L (ref 0–37)
Albumin: 3.7 g/dL (ref 3.5–5.2)
Alkaline Phosphatase: 48 U/L (ref 39–117)
BUN: 15 mg/dL (ref 6–23)
CO2: 26 meq/L (ref 19–32)
Calcium: 9.5 mg/dL (ref 8.4–10.5)
Chloride: 105 meq/L (ref 96–112)
Creatinine, Ser: 0.8 mg/dL (ref 0.4–1.2)
GFR: 78.45 mL/min (ref >60.00)
Glucose, Bld: 92 mg/dL (ref 70–99)
Potassium: 4.4 meq/L (ref 3.5–5.1)
Sodium: 137 meq/L (ref 135–145)
Total Bilirubin: 0.6 mg/dL (ref 0.3–1.2)
Total Protein: 7 g/dL (ref 6.0–8.3)

## 2013-05-01 LAB — LIPID PANEL
Cholesterol: 246 mg/dL — ABNORMAL HIGH (ref 0–200)
HDL: 54.1 mg/dL (ref >39.00)
Total CHOL/HDL Ratio: 5
Triglycerides: 108 mg/dL (ref 0.0–149.0)
VLDL: 21.6 mg/dL (ref 0.0–40.0)

## 2013-05-01 LAB — TSH: TSH: 2.29 u[IU]/mL (ref 0.35–5.50)

## 2013-05-01 LAB — LDL CHOLESTEROL, DIRECT: Direct LDL: 164.9 mg/dL

## 2013-05-01 NOTE — Telephone Encounter (Signed)
Pt was notified and said she would come in for the re-draw Tuesday

## 2013-05-01 NOTE — Telephone Encounter (Signed)
[facility] called saying the needed a re-draw of pt cbc and to called and have her come back in  Called pt, left her a voicemail to call back the office

## 2013-05-02 LAB — VITAMIN D 25 HYDROXY (VIT D DEFICIENCY, FRACTURES): Vit D, 25-Hydroxy: 36 ng/mL (ref 30–89)

## 2013-05-05 ENCOUNTER — Encounter: Payer: Self-pay | Admitting: Internal Medicine

## 2013-05-06 ENCOUNTER — Other Ambulatory Visit (INDEPENDENT_AMBULATORY_CARE_PROVIDER_SITE_OTHER): Payer: BC Managed Care – PPO | Admitting: *Deleted

## 2013-05-06 ENCOUNTER — Other Ambulatory Visit: Payer: BC Managed Care – PPO

## 2013-05-06 DIAGNOSIS — D696 Thrombocytopenia, unspecified: Secondary | ICD-10-CM

## 2013-05-06 DIAGNOSIS — Z139 Encounter for screening, unspecified: Secondary | ICD-10-CM

## 2013-05-06 LAB — CBC WITH DIFFERENTIAL/PLATELET
Eosinophils Relative: 0.7 % (ref 0.0–5.0)
HCT: 42.8 % (ref 36.0–46.0)
Hemoglobin: 14.7 g/dL (ref 12.0–15.0)
Lymphs Abs: 2.8 10*3/uL (ref 0.7–4.0)
Monocytes Relative: 7.4 % (ref 3.0–12.0)
Neutro Abs: 5.1 10*3/uL (ref 1.4–7.7)
RBC: 4.7 Mil/uL (ref 3.87–5.11)
WBC: 7.9 10*3/uL (ref 4.5–10.5)

## 2013-05-06 NOTE — Addendum Note (Signed)
Addended by: Montine Circle D on: 05/06/2013 11:34 AM   Modules accepted: Orders

## 2013-05-07 ENCOUNTER — Encounter: Payer: Self-pay | Admitting: Internal Medicine

## 2013-05-07 ENCOUNTER — Telehealth: Payer: Self-pay

## 2013-05-07 DIAGNOSIS — R109 Unspecified abdominal pain: Secondary | ICD-10-CM

## 2013-05-07 NOTE — Telephone Encounter (Signed)
Liver enYmes were normal.  i honestly don't know  what is causing her daily abdominal pain.  I really don't know want to order any additional tests without having her see GI again. Will she agree to a referral back to [facility] or a different GI specialist?

## 2013-05-07 NOTE — Telephone Encounter (Signed)
My Chart Message: Your labs are all normal with the exception of your B12 which is normal but on the low side . I suggest taking a B12 supplement that dissolves under the tongue, 1000 mcg daily. available OTc  Cholesterol is also borderline (LDL is 166, normal depends on your risks for CAD) so Would like you to follow a low cholesterol diet and repeaqt the fasting lipids in 6 months .    Notified patient about her lab results ^^^^^^^^^^^. Patient also wanted to know about her Liver enzymes she said she is still have pain daily in her abdomen area.

## 2013-05-07 NOTE — Telephone Encounter (Signed)
Notified patient about her liver enzymes are normal. Patient stated that she would send you a MyChart message in regards to going to a GI office.

## 2013-05-10 ENCOUNTER — Encounter: Payer: Self-pay | Admitting: Internal Medicine

## 2013-05-11 ENCOUNTER — Telehealth: Payer: Self-pay

## 2013-05-11 NOTE — Telephone Encounter (Signed)
I have fixed the appointment status for 05/06/13 and sent mychart message to patient and apologized. I also let her know that there was no charge associated with this appointment.

## 2013-05-11 NOTE — Telephone Encounter (Signed)
Carollee Herter please handle this discussion of No show charges with patient,  thanks

## 2013-05-11 NOTE — Telephone Encounter (Signed)
MyChart Message: My chart indicates I was a no show for lab. This is incorrect as I had my blood drawn on May 28 for CBC tests which are now showing in my results. Please correct and any charges that may be associated with a no show credited.  The patient came on 5/23 to do labs, and it does show for past appointments that patient was a no show on 5/28.

## 2013-05-13 ENCOUNTER — Encounter: Payer: Self-pay | Admitting: Internal Medicine

## 2013-05-14 ENCOUNTER — Telehealth: Payer: Self-pay | Admitting: Internal Medicine

## 2013-05-14 ENCOUNTER — Other Ambulatory Visit: Payer: Self-pay | Admitting: *Deleted

## 2013-05-14 NOTE — Telephone Encounter (Signed)
Referral is in process as requested and patient was notified via e mail

## 2013-05-14 NOTE — Telephone Encounter (Signed)
Patient wanting a referral to [facility] ([facility]) for liver lesions. Please advise.

## 2013-05-15 ENCOUNTER — Encounter: Payer: Self-pay | Admitting: Gastroenterology

## 2013-05-15 ENCOUNTER — Other Ambulatory Visit: Payer: Self-pay | Admitting: Internal Medicine

## 2013-05-15 DIAGNOSIS — K769 Liver disease, unspecified: Secondary | ICD-10-CM

## 2013-05-17 MED ORDER — BUPROPION HCL ER (SR) 100 MG PO TB12: 100.0000 mg | ORAL_TABLET | Freq: Two times a day (BID) | ORAL | Status: DC

## 2013-05-18 ENCOUNTER — Encounter: Payer: Self-pay | Admitting: Internal Medicine

## 2013-06-02 ENCOUNTER — Encounter: Payer: Self-pay | Admitting: Internal Medicine

## 2013-06-03 ENCOUNTER — Ambulatory Visit: Payer: Self-pay | Admitting: Physical Medicine and Rehabilitation

## 2013-06-03 ENCOUNTER — Encounter: Payer: Self-pay | Admitting: Internal Medicine

## 2013-06-15 ENCOUNTER — Encounter: Payer: Self-pay | Admitting: Gastroenterology

## 2013-06-15 ENCOUNTER — Ambulatory Visit (INDEPENDENT_AMBULATORY_CARE_PROVIDER_SITE_OTHER): Payer: BC Managed Care – PPO | Admitting: Gastroenterology

## 2013-06-15 VITALS — BP 128/90 | HR 60 | Ht 62.0 in | Wt 141.0 lb

## 2013-06-15 DIAGNOSIS — R933 Abnormal findings on diagnostic imaging of other parts of digestive tract: Secondary | ICD-10-CM

## 2013-06-15 DIAGNOSIS — M545 Low back pain, unspecified: Secondary | ICD-10-CM

## 2013-06-15 DIAGNOSIS — G8929 Other chronic pain: Secondary | ICD-10-CM

## 2013-06-15 DIAGNOSIS — Z86010 Personal history of colonic polyps: Secondary | ICD-10-CM

## 2013-06-15 DIAGNOSIS — K2100 Gastro-esophageal reflux disease with esophagitis, without bleeding: Secondary | ICD-10-CM

## 2013-06-15 DIAGNOSIS — R109 Unspecified abdominal pain: Secondary | ICD-10-CM

## 2013-06-15 NOTE — Patient Instructions (Addendum)
You have been given a separate informational sheet regarding your tobacco use, the importance of quitting and local resources to help you quit.  We will contact you in November to schedule a follow up MRI of the abdomen for December.   Thank you for choosing me and [facility].  Venita Lick. Pleas Koch., MD., Clementeen Graham  cc: Duncan Dull, MD

## 2013-06-15 NOTE — Progress Notes (Signed)
History of Present Illness: This is a 57 year old female with a three-year history of right flank, right back and occasionally right-sided abdominal pain. Her symptoms are clearly brought on by movement, bending and certain positions. Her symptoms have no relation to any digestive function. She has had an extensive gastrointestinal evaluation including an abdominal ultrasound, HIDA scan, abdominal/pelvic CT scan, colonoscopy and upper endoscopy. Hiatal hernia and erosive esophagitis was noted on endoscopy and she has since been maintained on omeprazole with excellent control of her reflux symptoms. She is being followed by physiatrist. An abdominal/pelvic CT scan in 05/2013 was compared to an abdominal/pelvic CT scan in 10/2012. Both scans show a stable 3 mm pancreatic body lesion and a stable 5 mm right hepatic lobe lesion. Fatty infiltration was also noted. Denies weight loss,  constipation, diarrhea, change in stool caliber, melena, hematochezia, nausea, vomiting, dysphagia, reflux symptoms, chest pain.   Current Medications, Allergies, Past Medical History, Past Surgical History, Family History and Social History were reviewed in [employer] record.  Physical Exam: General: Well developed , well nourished, no acute distress Head: Normocephalic and atraumatic Eyes:  sclerae anicteric, EOMI Ears: Normal auditory acuity Mouth: No deformity or lesions Lungs: Tenderness along the right costal margin, clear throughout to auscultation Heart: Regular rate and rhythm; no murmurs, rubs or bruits Abdomen: Soft, mild tenderness across the right abdomen and non distended. No masses, hepatosplenomegaly or hernias noted. Normal Bowel sounds Back: Tenderness along the right mid back  Musculoskeletal: Symmetrical with no gross deformities  Pulses:  Normal pulses noted Extremities: No clubbing, cyanosis, edema or deformities noted Neurological: Alert oriented x 4, grossly  nonfocal Psychological:  Alert and cooperative. Normal mood and affect  Assessment and Recommendations:  1. Chronic musculoskeletal pain involving the right flank, right back and right abdomen. Further followup and management per her physiatrist.  2. Small stable right hepatic lobe and pancreatic body lesions. These are both highly likely to be benign lesions due to the stability over the past 6 months and they are not responsible for her pain. I recommend followup imaging in 6 months with either a contrasted CT scan or contrasted MRI.  3. GERD with erosive esophagitis. Maintain standard antireflux measures and omeprazole 40 mg daily.  4. Fatty infiltration of the liver. A long-term low fat diet and weight loss program followed by her primary physician.  5. Personal history of adenomatous colon polyps. Surveillance colonoscopy 11/2017.

## 2013-06-25 ENCOUNTER — Encounter: Payer: Self-pay | Admitting: Internal Medicine

## 2013-07-12 ENCOUNTER — Other Ambulatory Visit: Payer: Self-pay | Admitting: Internal Medicine

## 2013-09-12 ENCOUNTER — Other Ambulatory Visit: Payer: Self-pay | Admitting: Internal Medicine

## 2013-10-09 ENCOUNTER — Ambulatory Visit (INDEPENDENT_AMBULATORY_CARE_PROVIDER_SITE_OTHER): Payer: BC Managed Care – PPO | Admitting: Internal Medicine

## 2013-10-09 ENCOUNTER — Encounter: Payer: Self-pay | Admitting: Internal Medicine

## 2013-10-09 VITALS — BP 142/80 | HR 59 | Temp 97.5°F | Resp 12 | Ht 63.0 in | Wt 132.8 lb

## 2013-10-09 DIAGNOSIS — Z23 Encounter for immunization: Secondary | ICD-10-CM

## 2013-10-09 DIAGNOSIS — F17200 Nicotine dependence, unspecified, uncomplicated: Secondary | ICD-10-CM

## 2013-10-09 DIAGNOSIS — K7689 Other specified diseases of liver: Secondary | ICD-10-CM

## 2013-10-09 DIAGNOSIS — E785 Hyperlipidemia, unspecified: Secondary | ICD-10-CM

## 2013-10-09 DIAGNOSIS — G8929 Other chronic pain: Secondary | ICD-10-CM

## 2013-10-09 DIAGNOSIS — R1011 Right upper quadrant pain: Secondary | ICD-10-CM

## 2013-10-09 DIAGNOSIS — Z888 Allergy status to other drugs, medicaments and biological substances status: Secondary | ICD-10-CM

## 2013-10-09 DIAGNOSIS — K760 Fatty (change of) liver, not elsewhere classified: Secondary | ICD-10-CM

## 2013-10-09 DIAGNOSIS — Z720 Tobacco use: Secondary | ICD-10-CM

## 2013-10-09 DIAGNOSIS — Z789 Other specified health status: Secondary | ICD-10-CM

## 2013-10-09 MED ORDER — ALPRAZOLAM 1 MG PO TABS: 0.5000 mg | ORAL_TABLET | Freq: Every evening | ORAL | Status: DC | PRN

## 2013-10-09 NOTE — Progress Notes (Signed)
Patient ID: Katrina Woods, female   DOB: 1956/05/29, 57 y.o.   MRN: [id number] Patient Active Problem List   Diagnosis Date Noted  . Statin intolerance 10/11/2013  . Fatty infiltration of liver 10/11/2013  . Routine general medical examination at a health care facility 04/17/2013  . COPD exacerbation 01/14/2013  . Thrombocytopenia 01/07/2013  . Influenza with bronchopneumonia 01/06/2013  . Tubular adenoma of colon 12/03/2012  . Abdominal pain, chronic, right upper quadrant 10/16/2012  . GERD (gastroesophageal reflux disease) 10/16/2012  . Tobacco abuse 09/12/2012  . Depression 09/12/2012  . Back pain, thoracic 09/12/2012  . Bursitis of left hip   . Hiatal hernia   . Hyperlipidemia   . Aneurysm     Subjective:  CC:   Chief Complaint  Patient presents with  . Annual Exam    wellness screening    HPI:   Katrina Scism Smithis a 57 y.o. female who presents for annual screening exam mandated by her insurance,.  Gyn exam was done as part of her preventive exam in May.  Her right sided back pain has been thoroughly worked up and determined to be secondary to "pinched nerve" and improved after receiving a nerve injection.  She uses a TENS UNIT at home as needed for recurrence(Chas[place]).  Saw Dr Russella Dar for evaluation of stable pancreatic cyst seen on abd CT .   Past Medical History  Diagnosis Date  . Bursitis of left hip 8-13    takes Meloxicam  . Chicken pox as a child  . Measles as a child  . [place] measles as a child  . Mumps as a child  . Hiatal hernia 209  . Hyperlipidemia   . 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 Endometriosis   . Esophagitis   . Tubular adenoma of colon 11/2012    Past Surgical History  Procedure Laterality Date  . Abdominal hysterectomy  2004    total  . Knee cartilage surgery  1987    Left knee- torn cartlidge  . Cesarean section  11-25-82  . Wisdom tooth extraction  1986    all four extracted  . Cerebral aneurysm repair   2006    Deveshwar, coil        The following portions of the patient's history were reviewed and updated as appropriate: Allergies, current medications, and problem list.    Review of Systems:   12 Pt  review of systems was negative except those addressed in the HPI,     History   Social History  . Marital Status: Single    Spouse Name: N/A    Number of Children: N/A  . Years of Education: N/A   Occupational History  . Not on file.   Social History Main Topics  . Smoking status: Current Every Day Smoker -- 0.50 packs/day for 28 years    Types: Cigarettes  . Smokeless tobacco: Never Used  . Alcohol Use: Yes     Comment: occasional  . Drug Use: No  . Sexual Activity: Not Currently   Other Topics Concern  . Not on file   Social History Narrative  . No narrative on file    Objective:  Filed Vitals:   10/09/13 1626  BP: 142/80  Pulse: 59  Temp: 97.5 F (36.4 C)  Resp: 12     General appearance: alert, cooperative and appears stated age Ears: normal TM's and external ear canals both ears Throat: lips, mucosa, and  tongue normal; teeth and gums normal Neck: no adenopathy, no carotid bruit, supple, symmetrical, trachea midline and thyroid not enlarged, symmetric, no tenderness/mass/nodules Back: symmetric, no curvature. ROM normal. No CVA tenderness. Lungs: clear to auscultation bilaterally Heart: regular rate and rhythm, S1, S2 normal, no murmur, click, rub or gallop Abdomen: soft, non-tender; bowel sounds normal; no masses,  no organomegaly Pulses: 2+ and symmetric Skin: Skin color, texture, turgor normal. No rashes or lesions Lymph nodes: Cervical, supraclavicular, and axillary nodes normal.  Assessment and Plan:  Abdominal pain, chronic, right upper quadrant Improved with physiotherapy ,  No GI source found per my workup and GI referral.   Hyperlipidemia Her 10 yr risk of CAD is 15% using the Framingham Risk Calculator for women. New ACC  guidelines recommend starting patients aged 60 or higher on moderate intensity statin therapy for LDL between 70-189 and 10 yr risk of CAD > 7.5% .  She has had a prior trial of statins and could not tolerate secondary to muscle pain  Tobacco abuse Smoking cessation instruction/counseling given:  counseled patient on the dangers of tobacco use, advised patient to stop smoking, and reviewed strategies to maximize success  Fatty infiltration of liver Incidental finding on CT scan ,  No hepatic enzyme elevation noted now or previously. Patient has statin intolerance.  Discussed condition with patient along with acceptable methods of diagnosis and treatment. No workup at this point .    Updated Medication List Outpatient Encounter Prescriptions as of 10/09/2013  Medication Sig  . ALPRAZolam (XANAX) 1 MG tablet Take 0.5 tablets (0.5 mg total) by mouth at bedtime as needed for sleep.  Marland Kitchen buPROPion (WELLBUTRIN SR) 100 MG 12 hr tablet TAKE 1 TABLET (100 MG TOTAL) BY MOUTH 2 (TWO) TIMES DAILY. SECOND DOSE BY 2 PM  . escitalopram (LEXAPRO) 5 MG tablet TAKE 1 TABLET BY MOUTH EVERY DAY  . omeprazole (PRILOSEC) 40 MG capsule TAKE 1 CAPSULE (40 MG TOTAL) BY MOUTH DAILY.  . [DISCONTINUED] ALPRAZolam (XANAX) 1 MG tablet Take 0.05 mg by mouth at bedtime as needed for sleep.  . [DISCONTINUED] budesonide-formoterol (SYMBICORT) 160-4.5 MCG/ACT inhaler Inhale 2 puffs into the lungs 2 (two) times daily.  . methocarbamol (ROBAXIN) 750 MG tablet Take 750 mg by mouth 3 (three) times daily.  . [DISCONTINUED] promethazine (PHENERGAN) 25 MG tablet Take 1 tablet (25 mg total) by mouth every 8 (eight) hours as needed for nausea.   A total of 40 minutes was spent with patient more than half of which was spent in counseling, reviewing records from other prviders and coordination of care.   Orders Placed This Encounter  Procedures  . Lipid panel  . Comp Met (CMET)    No Follow-up on file.

## 2013-10-09 NOTE — Patient Instructions (Signed)
Your ears are not infected bt they do look irritated form using your fingernail  Try using vaseline on a q tip to moisturize the canals.   We are checking your cholesterol and your liver tests today

## 2013-10-10 LAB — LIPID PANEL
Cholesterol: 253 mg/dL — ABNORMAL HIGH (ref 0–200)
HDL: 64 mg/dL (ref >39)
Total CHOL/HDL Ratio: 4 Ratio
Triglycerides: 119 mg/dL (ref <150)

## 2013-10-10 LAB — COMPREHENSIVE METABOLIC PANEL
CO2: 24 mEq/L (ref 19–32)
Calcium: 9.7 mg/dL (ref 8.4–10.5)
Chloride: 103 mEq/L (ref 96–112)
Glucose, Bld: 63 mg/dL — ABNORMAL LOW (ref 70–99)
Sodium: 139 mEq/L (ref 135–145)
Total Bilirubin: 0.3 mg/dL (ref 0.3–1.2)
Total Protein: 6.9 g/dL (ref 6.0–8.3)

## 2013-10-11 ENCOUNTER — Encounter: Payer: Self-pay | Admitting: Internal Medicine

## 2013-10-11 DIAGNOSIS — K760 Fatty (change of) liver, not elsewhere classified: Secondary | ICD-10-CM | POA: Insufficient documentation

## 2013-10-11 DIAGNOSIS — Z789 Other specified health status: Secondary | ICD-10-CM | POA: Insufficient documentation

## 2013-10-11 DIAGNOSIS — T466X5A Adverse effect of antihyperlipidemic and antiarteriosclerotic drugs, initial encounter: Secondary | ICD-10-CM | POA: Insufficient documentation

## 2013-10-11 NOTE — Assessment & Plan Note (Signed)
Improved with physiotherapy ,  No GI source found per my workup and GI referral.    

## 2013-10-11 NOTE — Assessment & Plan Note (Signed)
Incidental finding on CT scan ,  No hepatic enzyme elevation noted now or previously. Patient has statin intolerance.  Discussed condition with patient along with acceptable methods of diagnosis and treatment. No workup at this point .

## 2013-10-11 NOTE — Assessment & Plan Note (Addendum)
Her 10 yr risk of CAD is 15% using the Framingham Risk Calculator for women. New ACC guidelines recommend starting patients aged 58 or higher on moderate intensity statin therapy for LDL between 70-189 and 10 yr risk of CAD > 7.5% .  She has had a prior trial of statins and could not tolerate secondary to muscle pain

## 2013-10-11 NOTE — Assessment & Plan Note (Signed)
Smoking cessation instruction/counseling given:  counseled patient on the dangers of tobacco use, advised patient to stop smoking, and reviewed strategies to maximize success 

## 2013-10-12 ENCOUNTER — Other Ambulatory Visit: Payer: Self-pay | Admitting: Internal Medicine

## 2013-10-12 ENCOUNTER — Encounter: Payer: Self-pay | Admitting: Internal Medicine

## 2013-10-12 MED ORDER — COENZYME Q10 30 MG PO CAPS: 30.0000 mg | ORAL_CAPSULE | Freq: Every day | ORAL | Status: DC

## 2013-10-12 MED ORDER — ATORVASTATIN CALCIUM 20 MG PO TABS: 20.0000 mg | ORAL_TABLET | Freq: Every day | ORAL | Status: DC

## 2013-10-15 ENCOUNTER — Encounter: Payer: Self-pay | Admitting: Internal Medicine

## 2013-10-29 ENCOUNTER — Other Ambulatory Visit: Payer: Self-pay | Admitting: Internal Medicine

## 2013-10-30 ENCOUNTER — Other Ambulatory Visit: Payer: Self-pay | Admitting: Internal Medicine

## 2013-10-30 ENCOUNTER — Encounter: Payer: Self-pay | Admitting: Internal Medicine

## 2013-11-09 ENCOUNTER — Other Ambulatory Visit: Payer: Self-pay | Admitting: Gastroenterology

## 2013-11-09 DIAGNOSIS — K769 Liver disease, unspecified: Secondary | ICD-10-CM

## 2013-11-09 DIAGNOSIS — K869 Disease of pancreas, unspecified: Secondary | ICD-10-CM

## 2013-11-09 NOTE — Progress Notes (Signed)
Appt scheduled for 12/8 at 7:15 am at [facility] at [address] Pt verbalizd understanding. Pt notified this is for f/u CT scan she had 6 months ago at [facility].

## 2013-11-16 ENCOUNTER — Ambulatory Visit
Admission: RE | Admit: 2013-11-16 | Discharge: 2013-11-16 | Disposition: A | Discharge status: Home / Self Care | Payer: BC Managed Care – PPO | Source: Ambulatory Visit | Attending: Gastroenterology | Admitting: Gastroenterology

## 2013-11-16 DIAGNOSIS — K869 Disease of pancreas, unspecified: Secondary | ICD-10-CM

## 2013-11-16 DIAGNOSIS — K769 Liver disease, unspecified: Secondary | ICD-10-CM

## 2013-11-16 MED ORDER — GADOBENATE DIMEGLUMINE 529 MG/ML IV SOLN
12.0000 mL | Freq: Once | INTRAVENOUS | Status: AC | PRN
  Administered 2013-11-16: 12 mL via INTRAVENOUS

## 2013-12-07 ENCOUNTER — Other Ambulatory Visit: Payer: Self-pay | Admitting: *Deleted

## 2013-12-07 MED ORDER — ESCITALOPRAM OXALATE 5 MG PO TABS: ORAL_TABLET | ORAL | Status: DC

## 2013-12-10 DIAGNOSIS — Z8709 Personal history of other diseases of the respiratory system: Secondary | ICD-10-CM

## 2013-12-10 HISTORY — DX: Personal history of other diseases of the respiratory system: Z87.09

## 2014-01-06 ENCOUNTER — Telehealth: Payer: Self-pay | Admitting: Internal Medicine

## 2014-01-06 NOTE — Telephone Encounter (Signed)
Appt at [facility], [place]

## 2014-01-06 NOTE — Telephone Encounter (Signed)
Patient Information:  Caller Name: Kerisha  Phone: [phone]  Patient: Katrina Woods, Katrina Woods  Gender: Female  DOB: 1955/12/26  Age: 58 Years  PCP: Deborra Medina (Adults only)  Office Follow Up:  Does the office need to follow up with this patient?: No  Instructions For The Office: Offered appointment at [place] location tonight but she refused. She has appnt for  08:45 at [facility]. Discussed going to ER tonight is starts with trouble breathing or swallowing.    Symptoms  Reason For Call & Symptoms: Started with sore throat on 01/05/14- took Advil 800 mgs PO and went to sleep. Drainage in throat but not coughing. R ear hurts and is congested. Took another dose of Advil 800 mgs at 09:30. this morning. She is drinking plenty of fluids and staying hydrated. Eating soft foods but has lump in throat.  Reviewed Health History In EMR: Yes  Reviewed Medications In EMR: Yes  Reviewed Allergies In EMR: Yes  Reviewed Surgeries / Procedures: Yes  Date of Onset of Symptoms: 01/05/2014  Treatments Tried: Advil 800 mgs PO every 8-10 hours  Treatments Tried Worked: No  Any Fever: Yes  Fever Taken: Oral  Fever Time Of Reading: 06:30:00  Fever Last Reading: 100.3  Guideline(s) Used:  Earache  Sore Throat  Disposition Per Guideline:   See Today in Office  Reason For Disposition Reached:   Earache also present  Advice Given:  Contagiousness:  Ear infections are not contagious.  Call Back If  Earache last more than 1 hour  High fever, severe headache, or stiff neck occurs  You become worse.  For Relief of Sore Throat Pain:  Sip warm chicken broth or apple juice.  Suck on hard candy or a throat lozenge (over-the-counter).  Gargle warm salt water 3 times daily (1 teaspoon of salt in 8 oz or 240 ml of warm water).  Avoid cigarette smoke.  Soft Diet:   Cold drinks and milk shakes are especially good (Reason: swollen tonsils can make some foods hard to swallow).  Liquids:  Adequate liquid intake is important to prevent dehydration. Drink 6-8 glasses of water per day.  Contagiousness:   You can return to work or school after the fever is gone and you feel well enough to participate in normal activities. If your doctor determines that you have Strep throat, then you will need to take an antibiotic for 24 hours before you can return.  Expected Course:  Sore throats with viral illnesses usually last 3 or 4 days.  Call Back If:  Sore throat is the main symptom and it lasts longer than 24 hours  Sore throat is mild but lasts longer than 4 days  Fever lasts longer than 3 days  Patient Will Follow Care Advice:  YES

## 2014-01-07 ENCOUNTER — Encounter: Payer: Self-pay | Admitting: Internal Medicine

## 2014-01-07 ENCOUNTER — Ambulatory Visit (INDEPENDENT_AMBULATORY_CARE_PROVIDER_SITE_OTHER): Payer: BC Managed Care – PPO | Admitting: Internal Medicine

## 2014-01-07 VITALS — BP 122/82 | HR 81 | Temp 98.1°F | Wt 127.0 lb

## 2014-01-07 DIAGNOSIS — J069 Acute upper respiratory infection, unspecified: Secondary | ICD-10-CM

## 2014-01-07 DIAGNOSIS — J020 Streptococcal pharyngitis: Secondary | ICD-10-CM

## 2014-01-07 LAB — POCT RAPID STREP A (OFFICE): Rapid Strep A Screen: NEGATIVE

## 2014-01-07 MED ORDER — AZITHROMYCIN 250 MG PO TABS: ORAL_TABLET | ORAL | Status: DC

## 2014-01-07 NOTE — Progress Notes (Signed)
Pre-visit discussion using our clinic review tool. No additional management support is needed unless otherwise documented below in the visit note.  

## 2014-01-07 NOTE — Addendum Note (Signed)
Addended by: Lurlean Nanny on: 01/07/2014 09:56 AM   Modules accepted: Orders

## 2014-01-07 NOTE — Patient Instructions (Signed)

## 2014-01-07 NOTE — Progress Notes (Signed)
HPI  Pt presents to the clinic today with c/o sore throat, chills, ear pain and nasal congestion. This started 2 days ago. She does report difficulty with swallowing. She is not sure if she has been running fevers but she has felt like it. She has taken Ibuprofen OTC without much relief. She not had sick contacts that she is aware of. She is a smoker. She did get her flu shot.  Review of Systems    Past Medical History  Diagnosis Date  . Bursitis of left hip 8-13    takes Meloxicam  . Chicken pox as a child  . Measles as a child  . [place] measles as a child  . Mumps as a child  . Hiatal hernia 209  . Hyperlipidemia   . 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 Endometriosis   . Esophagitis   . Tubular adenoma of colon 11/2012    Family History  Problem Relation Age of Onset  . Diabetes Mother     type 2  . Hypertension Mother   . Heart disease Mother     CHF  . Cancer Mother     lung- previous smoker- had quit 12 yrs before  . Appendicitis Father     acute  . Cancer Father     prostate?  . Diabetes Brother 24    type 2  . Hypertension Brother   . Cancer Maternal Grandmother     breast, uterine  . Hypertension Maternal Grandmother   . Heart disease Maternal Grandmother     CHF  . Diabetes Maternal Grandmother   . Stroke Maternal Grandfather   . Heart block Paternal Grandfather     massive  . Colon cancer Neg Hx   . Stomach cancer Neg Hx     History   Social History  . Marital Status: Single    Spouse Name: N/A    Number of Children: N/A  . Years of Education: N/A   Occupational History  . Not on file.   Social History Main Topics  . Smoking status: Current Every Day Smoker -- 0.50 packs/day for 28 years    Types: Cigarettes  . Smokeless tobacco: Never Used  . Alcohol Use: Yes     Comment: occasional  . Drug Use: No  . Sexual Activity: Not Currently   Other Topics Concern  . Not on file   Social History Narrative  . No  narrative on file    Allergies  Allergen Reactions  . Statins     Muscle aches     Constitutional: Positive headache, fatigue. Denies fever or abrupt weight changes.  HEENT:  Positive ear pain, nasal congestion and sore throat. Denies eye redness, ringing in the ears, wax buildup, runny nose or bloody nose. Respiratory: Denies cough, difficulty breathing or shortness of breath.  Cardiovascular: Denies chest pain, chest tightness, palpitations or swelling in the hands or feet.   No other specific complaints in a complete review of systems (except as listed in HPI above).  Objective:    BP 122/82  Pulse 81  Temp(Src) 98.1 F (36.7 C) (Oral)  Wt 127 lb (57.607 kg)  SpO2 99% Wt Readings from Last 3 Encounters:  01/07/14 127 lb (57.607 kg)  10/09/13 132 lb 12 oz (60.215 kg)  06/15/13 141 lb (63.957 kg)    General: Appears her stated age, well developed, well nourished in NAD. HEENT: Head: normal shape and size; Eyes: sclera  injected, no icterus, conjunctiva pink, PERRLA and EOMs intact; Ears: Tm's pink and intact, normal light reflex; Nose: mucosa pink and moist, septum midline; Throat/Mouth: + PND. Teeth present, mucosa erythematous and moist, no exudate noted, no lesions or ulcerations noted.  Neck: Mild cervical lymphadenopathy. Neck supple, trachea midline. No massses, lumps or thyromegaly present.  Cardiovascular: Normal rate and rhythm. S1,S2 noted.  No murmur, rubs or gallops noted. No JVD or BLE edema. No carotid bruits noted. Pulmonary/Chest: Normal effort and positive vesicular breath sounds. No respiratory distress. No wheezes, rales or ronchi noted.      Assessment & Plan:   Acute Upper Respiratory Infection:  RST- negative She looks like she is feeling very poorly and since we are heading into the weekend-will cover with a zpack Salt water gargles for the sore throat Ok to continue ibuprofen for pain/fever  RTC as needed or if symptoms persist.

## 2014-01-08 ENCOUNTER — Telehealth: Payer: Self-pay | Admitting: Internal Medicine

## 2014-01-08 NOTE — Telephone Encounter (Signed)
Relevant patient education assigned to patient using Emmi. ° °

## 2014-01-19 ENCOUNTER — Other Ambulatory Visit: Payer: Self-pay | Admitting: *Deleted

## 2014-01-19 MED ORDER — BUPROPION HCL ER (SR) 100 MG PO TB12
ORAL_TABLET | ORAL | Status: DC
Start: 2014-01-19 — End: 2014-07-13

## 2014-05-17 ENCOUNTER — Other Ambulatory Visit: Payer: Self-pay | Admitting: *Deleted

## 2014-05-17 MED ORDER — ESCITALOPRAM OXALATE 5 MG PO TABS: ORAL_TABLET | ORAL | Status: DC

## 2014-05-17 NOTE — Telephone Encounter (Signed)
Apt 06/18/14

## 2014-06-18 ENCOUNTER — Encounter: Payer: BC Managed Care – PPO | Admitting: Internal Medicine

## 2014-06-19 ENCOUNTER — Other Ambulatory Visit: Payer: Self-pay | Admitting: Internal Medicine

## 2014-06-21 NOTE — Telephone Encounter (Signed)
Last refill 6.8.15, last OV 1.29.15, future OV 8.4.15.  Please advise refill

## 2014-06-22 ENCOUNTER — Other Ambulatory Visit: Payer: Self-pay | Admitting: Internal Medicine

## 2014-06-22 MED ORDER — ESCITALOPRAM OXALATE 5 MG PO TABS: ORAL_TABLET | ORAL | Status: DC

## 2014-06-22 NOTE — Telephone Encounter (Signed)
Has appt 07/13/14

## 2014-06-25 NOTE — Telephone Encounter (Signed)
Ok to refill,  Refill sent  

## 2014-07-13 ENCOUNTER — Ambulatory Visit (INDEPENDENT_AMBULATORY_CARE_PROVIDER_SITE_OTHER): Payer: BC Managed Care – PPO | Admitting: Internal Medicine

## 2014-07-13 ENCOUNTER — Encounter: Payer: Self-pay | Admitting: Internal Medicine

## 2014-07-13 VITALS — BP 106/62 | HR 73 | Temp 97.8°F | Resp 16 | Ht 62.75 in | Wt 131.8 lb

## 2014-07-13 DIAGNOSIS — K449 Diaphragmatic hernia without obstruction or gangrene: Secondary | ICD-10-CM

## 2014-07-13 DIAGNOSIS — G8929 Other chronic pain: Secondary | ICD-10-CM

## 2014-07-13 DIAGNOSIS — E785 Hyperlipidemia, unspecified: Secondary | ICD-10-CM

## 2014-07-13 DIAGNOSIS — Z0000 Encounter for general adult medical examination without abnormal findings: Secondary | ICD-10-CM

## 2014-07-13 DIAGNOSIS — G609 Hereditary and idiopathic neuropathy, unspecified: Secondary | ICD-10-CM

## 2014-07-13 DIAGNOSIS — Z79899 Other long term (current) drug therapy: Secondary | ICD-10-CM

## 2014-07-13 DIAGNOSIS — F17200 Nicotine dependence, unspecified, uncomplicated: Secondary | ICD-10-CM

## 2014-07-13 DIAGNOSIS — F341 Dysthymic disorder: Secondary | ICD-10-CM

## 2014-07-13 DIAGNOSIS — Z1239 Encounter for other screening for malignant neoplasm of breast: Secondary | ICD-10-CM

## 2014-07-13 DIAGNOSIS — Z720 Tobacco use: Secondary | ICD-10-CM

## 2014-07-13 DIAGNOSIS — M542 Cervicalgia: Secondary | ICD-10-CM

## 2014-07-13 DIAGNOSIS — F418 Other specified anxiety disorders: Secondary | ICD-10-CM

## 2014-07-13 DIAGNOSIS — R1011 Right upper quadrant pain: Secondary | ICD-10-CM

## 2014-07-13 DIAGNOSIS — D696 Thrombocytopenia, unspecified: Secondary | ICD-10-CM

## 2014-07-13 MED ORDER — GABAPENTIN 100 MG PO CAPS: 100.0000 mg | ORAL_CAPSULE | Freq: Three times a day (TID) | ORAL | Status: DC

## 2014-07-13 MED ORDER — ESCITALOPRAM OXALATE 5 MG PO TABS: ORAL_TABLET | ORAL | Status: DC

## 2014-07-13 MED ORDER — OMEPRAZOLE 40 MG PO CPDR: DELAYED_RELEASE_CAPSULE | ORAL | Status: DC

## 2014-07-13 MED ORDER — ALPRAZOLAM 1 MG PO TABS: ORAL_TABLET | ORAL | Status: DC

## 2014-07-13 NOTE — Assessment & Plan Note (Signed)
Continue anti reflex medication

## 2014-07-13 NOTE — Assessment & Plan Note (Signed)
Smoking cessation instruction/counseling given:  counseled patient on the dangers of tobacco use, advised patient to stop smoking, and reviewed strategies to maximize success 

## 2014-07-13 NOTE — Assessment & Plan Note (Signed)
Improved with physiotherapy ,  No GI source found per my workup and GI referral.

## 2014-07-13 NOTE — Progress Notes (Signed)
Patient ID: Katrina Woods, female   DOB: 1955-12-13, 58 y.o.   MRN: [id number]     Subjective:     Katrina Woods is a 58 y.o. female and is here for a comprehensive physical exam. The patient reports problems - as follows: .  1) Depression with anxiety.  Did not tolerate wellbutrin dut to increased agitation,  Resumed lexapro,  Ended her 17 yr romantic relationship in January and now in counselling .  Still caring for aging father who has dementia and is now having paranoid delusions and visual hallulcinations.    2) Ran a 5 K recently.  Wants to quit smoking to improve her time.  Does not want chantix.  3) Her chronic Right sided pain has improved with Dr Gomez Cleverly management.  Last MRI reviewed in detail with patient today with regard to hepatic and pancreatic findings   4) Now having posterior neck pain left shoulder burns, bilateral numbness and tingling of hands,  r > L for 4 months.  No history of trauma.  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    History   Social History  . Marital Status: Single    Spouse Name: N/A    Number of Children: N/A  . Years of Education: N/A   Occupational History  . Not on file.   Social History Main Topics  . Smoking status: Current Every Day Smoker -- 0.50 packs/day for 28 years    Types: Cigarettes  . Smokeless tobacco: Never Used  . Alcohol Use: Yes     Comment: occasional  . Drug Use: No  . Sexual Activity: Not Currently   Other Topics Concern  . Not on file   Social History Narrative  . No narrative on file   Health Maintenance  Topic Date Due  . Influenza Vaccine  07/10/2014  . Mammogram  05/07/2015  . Pap Smear  04/15/2016  . Colonoscopy  11/27/2017  . Tetanus/tdap  04/16/2023    The following portions of  the patient's history were reviewed and updated as appropriate: allergies, current medications, past family history, past medical history, past social history, past surgical history and problem list.  Review of Systems A comprehensive review of systems was negative.   Objective:   BP 106/62  Pulse 73  Temp(Src) 97.8 F (36.6 C) (Oral)  Resp 16  Ht 5' 2.75" (1.594 m)  Wt 131 lb 12 oz (59.761 kg)  BMI 23.52 kg/m2  SpO2 97% General appearance: alert, cooperative and appears stated age Head: Normocephalic, without obvious abnormality, atraumatic Eyes: conjunctivae/corneas clear. PERRL, EOM's intact. Fundi benign. Ears: normal TM's and external ear canals both ears Nose: Nares normal. Septum midline. Mucosa normal. No drainage or sinus tenderness. Throat: lips, mucosa, and tongue normal; teeth and gums normal Neck: no adenopathy, no carotid bruit, no JVD, supple, symmetrical, trachea midline and thyroid not enlarged, symmetric, no tenderness/mass/nodules Lungs: clear to auscultation bilaterally Breasts: normal appearance, no masses or tenderness Heart: regular rate and rhythm, S1, S2 normal, no murmur, click, rub or gallop Abdomen: soft, non-tender; bowel sounds normal; no masses,  no organomegaly Extremities: extremities normal, atraumatic, no cyanosis or edema Pulses: 2+ and symmetric Skin: Skin color, texture, turgor normal. No rashes or lesions Neurologic: Alert and oriented  X 3, normal strength and tone. Normal symmetric reflexes. Normal coordination and gait.   .    Assessment and Plan:   Unspecified hereditary and idiopathic peripheral neuropathy Bilateral hand numbness and tingling for the past 4 months,  Right greater than left,  associated with some posterior neck pain that vauses trapezius muscle burning on the left. Not sure if this is CTS , metaboilc, or cerivcal spine in origin,  Plain films and labs first,  If all normal,  EMG  studies planned .  Will resume gabapentin  at bedtime.   Hiatal hernia Continue anti reflex medication   Tobacco abuse Smoking cessation instruction/counseling given:  counseled patient on the dangers of tobacco use, advised patient to stop smoking, and reviewed strategies to maximize success  Hyperlipidemia Managed with atrovastatin.  Last lipid panel was October .  She will return for fasting labs  Depression with anxiety She did not tolerate wellbutrin trial and has resumed lexapro. She is still grieving the end of a 17 year relationship and is now in counselling at the [facility] in [place]. She is using alprazolam at night to manage insomnia caused by overactive thought processes.  She does not use it more than once daily but understands that if her need for additional anxiolytics increases we will add buspar or increase the lexapro,   Thrombocytopenia Patient was advised of low platelet count in November and instructed to stop any aspirin products and return for repeat labs in one month which did not occur, but repeat 6 months later was normal.  Abdominal pain, chronic, right upper quadrant Improved with physiotherapy ,  No GI source found per my workup and GI referral.     Routine general medical examination at a health care facility Annual comprehensive exam was done including breast, excluding pelvic and PAP smear. All screenings have been brought up to date. 3 d mammogram to be ordered.   A total of 45 minutes was spent with patient more than half of which was spent in counseling, reviewing records from other providers and coordination of care.  Updated Medication List Outpatient Encounter Prescriptions as of 07/13/2014  Medication Sig  . ALPRAZolam (XANAX) 1 MG tablet TAKE 1 TABLET BY MOUTH AT BEDTIME AS NEEDED FOR SLEEP  . atorvastatin (LIPITOR) 20 MG tablet Take 1 tablet (20 mg total) by mouth daily.  Marland Kitchen escitalopram (LEXAPRO) 5 MG tablet TAKE 1 TABLET EVERY DAY  . omeprazole  (PRILOSEC) 40 MG capsule TAKE 1 CAPSULE (40 MG TOTAL) BY MOUTH DAILY.  . [DISCONTINUED] ALPRAZolam (XANAX) 1 MG tablet TAKE 1 TABLET BY MOUTH AT BEDTIME AS NEEDED FOR SLEEP  . [DISCONTINUED] escitalopram (LEXAPRO) 5 MG tablet TAKE 1 TABLET EVERY DAY  . [DISCONTINUED] omeprazole (PRILOSEC) 40 MG capsule TAKE 1 CAPSULE (40 MG TOTAL) BY MOUTH DAILY.  Marland Kitchen co-enzyme Q-10 30 MG capsule Take 1 capsule (30 mg total) by mouth daily.  Marland Kitchen gabapentin (NEURONTIN) 100 MG capsule Take 1 capsule (100 mg total) by mouth 3 (three) times daily.  . [DISCONTINUED] azithromycin (ZITHROMAX) 250 MG tablet Take 2 tabs today, then 1 tab daily x 4 days  . [DISCONTINUED] buPROPion (WELLBUTRIN SR) 100 MG 12 hr tablet TAKE 1 TABLET (100 MG TOTAL) BY MOUTH 2 (TWO) TIMES DAILY. SECOND DOSE BY 2 PM  . [DISCONTINUED] escitalopram (LEXAPRO) 5 MG tablet TAKE 1 TABLET BY MOUTH EVERY DAY  . [DISCONTINUED] methocarbamol (ROBAXIN) 750 MG tablet Take 750 mg by mouth 3 (three) times daily.

## 2014-07-13 NOTE — Assessment & Plan Note (Addendum)
Bilateral hand numbness and tingling for the past 4 months,  Right greater than left,  associated with some posterior neck pain that vauses trapezius muscle burning on the left. Not sure if this is CTS , metaboilc, or cerivcal spine in origin,  Plain films and labs first,  If all normal,  EMG [place] studies planned .  Will resume gabapentin at bedtime.

## 2014-07-13 NOTE — Assessment & Plan Note (Signed)
Managed with atrovastatin.  Last lipid panel was October .  She will return for fasting labs

## 2014-07-13 NOTE — Assessment & Plan Note (Signed)
Annual comprehensive exam was done including breast, excluding pelvic and PAP smear. All screenings have been brought up to date. 3 d mammogram to be ordered.

## 2014-07-13 NOTE — Assessment & Plan Note (Signed)
She did not tolerate wellbutrin trial and has resumed lexapro. She is still grieving the end of a 17 year relationship and is now in counselling at the [facility] in [place]. She is using alprazolam at night to manage insomnia caused by overactive thought processes.  She does not use it more than once daily but understands that if her need for additional anxiolytics increases we will add buspar or increase the lexapro,

## 2014-07-13 NOTE — Assessment & Plan Note (Signed)
Patient was advised of low platelet count in November and instructed to stop any aspirin products and return for repeat labs in one month which did not occur, but repeat 6 months later was normal.

## 2014-07-13 NOTE — Progress Notes (Signed)
Pre-visit discussion using our clinic review tool. No additional management support is needed unless otherwise documented below in the visit note.  

## 2014-07-13 NOTE — Patient Instructions (Addendum)
You had your annual  wellness exam today.  We will repeat your PAP smear in 2017, sooner if needed   You are due for mammogram we will schedule it soon at [facility]   Please make an appt for fasting labs at your earliest convenience.   We will contact you with the bloodwork results  Trial of gabapentin for night time use.  Start with 100 mg and increase to 300 mg gradually over 2 weeks   I have ordered Plain films of the cervical spine to look at your alignment .  Please get them done at [facility] in [place]

## 2014-07-15 ENCOUNTER — Encounter: Payer: Self-pay | Admitting: Internal Medicine

## 2014-07-15 DIAGNOSIS — R21 Rash and other nonspecific skin eruption: Secondary | ICD-10-CM

## 2014-07-19 DIAGNOSIS — R21 Rash and other nonspecific skin eruption: Secondary | ICD-10-CM | POA: Insufficient documentation

## 2014-07-19 MED ORDER — CEPHALEXIN 500 MG PO CAPS: 500.0000 mg | ORAL_CAPSULE | Freq: Four times a day (QID) | ORAL | Status: DC

## 2014-07-19 NOTE — Assessment & Plan Note (Signed)
Around mouth,  Treating for Staph with keflex for 1 week

## 2014-07-21 ENCOUNTER — Other Ambulatory Visit: Payer: Self-pay | Admitting: Internal Medicine

## 2014-07-21 ENCOUNTER — Other Ambulatory Visit (INDEPENDENT_AMBULATORY_CARE_PROVIDER_SITE_OTHER): Payer: BC Managed Care – PPO

## 2014-07-21 ENCOUNTER — Ambulatory Visit
Admission: RE | Admit: 2014-07-21 | Discharge: 2014-07-21 | Disposition: A | Discharge status: Home / Self Care | Payer: BC Managed Care – PPO | Source: Ambulatory Visit | Attending: Internal Medicine | Admitting: Internal Medicine

## 2014-07-21 DIAGNOSIS — D696 Thrombocytopenia, unspecified: Secondary | ICD-10-CM

## 2014-07-21 DIAGNOSIS — G609 Hereditary and idiopathic neuropathy, unspecified: Secondary | ICD-10-CM

## 2014-07-21 DIAGNOSIS — Z79899 Other long term (current) drug therapy: Secondary | ICD-10-CM

## 2014-07-21 DIAGNOSIS — E785 Hyperlipidemia, unspecified: Secondary | ICD-10-CM

## 2014-07-21 DIAGNOSIS — Z1239 Encounter for other screening for malignant neoplasm of breast: Secondary | ICD-10-CM

## 2014-07-21 LAB — CBC WITH DIFFERENTIAL/PLATELET
BASOS PCT: 0.6 % (ref 0.0–3.0)
Basophils Absolute: 0.1 10*3/uL (ref 0.0–0.1)
Eosinophils Absolute: 0.1 10*3/uL (ref 0.0–0.7)
Eosinophils Relative: 1.2 % (ref 0.0–5.0)
HEMATOCRIT: 42.4 % (ref 36.0–46.0)
Hemoglobin: 14.4 g/dL (ref 12.0–15.0)
Lymphocytes Relative: 25.8 % (ref 12.0–46.0)
Lymphs Abs: 2.2 10*3/uL (ref 0.7–4.0)
MCHC: 33.9 g/dL (ref 30.0–36.0)
MCV: 91.9 fl (ref 78.0–100.0)
MONOS PCT: 6.7 % (ref 3.0–12.0)
Monocytes Absolute: 0.6 10*3/uL (ref 0.1–1.0)
NEUTROS PCT: 65.7 % (ref 43.0–77.0)
Neutro Abs: 5.5 10*3/uL (ref 1.4–7.7)
PLATELETS: 234 10*3/uL (ref 150.0–400.0)
RBC: 4.61 Mil/uL (ref 3.87–5.11)
RDW: 14.3 % (ref 11.5–15.5)
WBC: 8.4 10*3/uL (ref 4.0–10.5)

## 2014-07-21 LAB — COMPREHENSIVE METABOLIC PANEL
ALBUMIN: 3.8 g/dL (ref 3.5–5.2)
ALT: 11 U/L (ref 0–35)
AST: 18 U/L (ref 0–37)
Alkaline Phosphatase: 54 U/L (ref 39–117)
BUN: 16 mg/dL (ref 6–23)
CO2: 25 mEq/L (ref 19–32)
Calcium: 9.6 mg/dL (ref 8.4–10.5)
Chloride: 105 mEq/L (ref 96–112)
Creatinine, Ser: 1 mg/dL (ref 0.4–1.2)
GFR: 60.38 mL/min (ref >60.00)
GLUCOSE: 88 mg/dL (ref 70–99)
POTASSIUM: 4.5 meq/L (ref 3.5–5.1)
SODIUM: 139 meq/L (ref 135–145)
TOTAL PROTEIN: 6.9 g/dL (ref 6.0–8.3)
Total Bilirubin: 0.5 mg/dL (ref 0.2–1.2)

## 2014-07-21 LAB — LIPID PANEL
CHOL/HDL RATIO: 3
Cholesterol: 186 mg/dL (ref 0–200)
HDL: 63.9 mg/dL (ref >39.00)
LDL CALC: 105 mg/dL — AB (ref 0–99)
NonHDL: 122.1
Triglycerides: 84 mg/dL (ref 0.0–149.0)
VLDL: 16.8 mg/dL (ref 0.0–40.0)

## 2014-07-21 LAB — TSH: TSH: 3.11 u[IU]/mL (ref 0.35–4.50)

## 2014-07-21 LAB — VITAMIN B12: VITAMIN B 12: 271 pg/mL (ref 211–911)

## 2014-07-24 ENCOUNTER — Encounter: Payer: Self-pay | Admitting: Internal Medicine

## 2014-08-02 ENCOUNTER — Other Ambulatory Visit: Payer: Self-pay | Admitting: Internal Medicine

## 2014-08-26 ENCOUNTER — Encounter: Payer: Self-pay | Admitting: Internal Medicine

## 2014-08-26 ENCOUNTER — Ambulatory Visit (INDEPENDENT_AMBULATORY_CARE_PROVIDER_SITE_OTHER): Payer: BC Managed Care – PPO | Admitting: Internal Medicine

## 2014-08-26 VITALS — BP 112/70 | HR 68 | Temp 97.9°F | Wt 137.0 lb

## 2014-08-26 DIAGNOSIS — R300 Dysuria: Secondary | ICD-10-CM

## 2014-08-26 DIAGNOSIS — R3915 Urgency of urination: Secondary | ICD-10-CM

## 2014-08-26 LAB — POCT URINALYSIS DIPSTICK
Bilirubin, UA: NEGATIVE
Glucose, UA: NEGATIVE
Ketones, UA: NEGATIVE
Leukocytes, UA: NEGATIVE
NITRITE UA: NEGATIVE
PH UA: 6
Protein, UA: NEGATIVE
RBC UA: NEGATIVE
Spec Grav, UA: 1.01
Urobilinogen, UA: NEGATIVE

## 2014-08-26 NOTE — Progress Notes (Signed)
HPI  Pt presents to the clinic today with c/o urgency, frequency, dysuria. She reports this started yesterday. She has had some associated abdominal pressure.  She denies fever, chills, nausea or low back pain. She has not tried anything OTC. She reports that 1 week ago she did have a yeast infection. She did take OTC treatment with good relief. She denies any vaginal discharge, irritation or itching currently.   Review of Systems  Past Medical History  Diagnosis Date  . Bursitis of left hip 8-13    takes Meloxicam  . Chicken pox as a child  . Measles as a child  . [place] measles as a child  . Mumps as a child  . Hiatal hernia 209  . Hyperlipidemia   . 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 Endometriosis   . Esophagitis   . Tubular adenoma of colon 11/2012    Family History  Problem Relation Age of Onset  . Diabetes Mother     type 2  . Hypertension Mother   . Heart disease Mother     CHF  . Cancer Mother     lung- previous smoker- had quit 12 yrs before  . Appendicitis Father     acute  . Cancer Father     prostate?  . Diabetes Brother 31    type 2  . Hypertension Brother   . Cancer Maternal Grandmother     breast, uterine  . Hypertension Maternal Grandmother   . Heart disease Maternal Grandmother     CHF  . Diabetes Maternal Grandmother   . Stroke Maternal Grandfather   . Heart block Paternal Grandfather     massive  . Colon cancer Neg Hx   . Stomach cancer Neg Hx     History   Social History  . Marital Status: Single    Spouse Name: N/A    Number of Children: N/A  . Years of Education: N/A   Occupational History  . Not on file.   Social History Main Topics  . Smoking status: Current Every Day Smoker -- 0.50 packs/day for 28 years    Types: Cigarettes  . Smokeless tobacco: Never Used  . Alcohol Use: Yes     Comment: occasional  . Drug Use: No  . Sexual Activity: Not Currently   Other Topics Concern  . Not on file    Social History Narrative  . No narrative on file    Allergies  Allergen Reactions  . Statins     Muscle aches    Constitutional: Denies fever, malaise, fatigue, headache or abrupt weight changes.   GU: Pt reports urgency, frequency and pain with urination. Denies burning sensation, blood in urine, odor or discharge. Skin: Denies redness, rashes, lesions or ulcercations.   No other specific complaints in a complete review of systems (except as listed in HPI above).    Objective:   Physical Exam BP 112/70  Pulse 68  Temp(Src) 97.9 F (36.6 C) (Oral)  Wt 137 lb (62.143 kg)  SpO2 98%   Wt Readings from Last 3 Encounters:  08/26/14 137 lb (62.143 kg)  07/13/14 131 lb 12 oz (59.761 kg)  01/07/14 127 lb (57.607 kg)    General: Appears her stated age, well developed, well nourished in NAD. Cardiovascular: Normal rate and rhythm. S1,S2 noted.  No murmur, rubs or gallops noted. No JVD or BLE edema. No carotid bruits noted. Pulmonary/Chest: Normal effort and positive vesicular breath  sounds. No respiratory distress. No wheezes, rales or ronchi noted.  Abdomen: Soft. Normal bowel sounds, no bruits noted. No distention or masses noted. Liver, spleen and kidneys non palpable. Slightly tender to palpation over the bladder area. No CVA tenderness.      Assessment & Plan:   Urgency, Frequency, Dysuria secondary to ? Cystitis  Urinalysis: normal OK to take AZO OTC Drink plenty of fluids She is going out of town- advised her that if symptoms worsen, she can call me and will call in abx to [facility] in [place]  RTC as needed or if symptoms persist.

## 2014-08-26 NOTE — Addendum Note (Signed)
Addended by: Lurlean Nanny on: 08/26/2014 11:05 AM   Modules accepted: Orders

## 2014-08-26 NOTE — Progress Notes (Signed)
Pre visit review using our clinic review tool, if applicable. No additional management support is needed unless otherwise documented below in the visit note. 

## 2014-08-26 NOTE — Progress Notes (Signed)
Subjective:    Patient ID: Katrina Woods, female    DOB: 12-07-1956, 58 y.o.   MRN: [id number]  HPI HPI  Pt presents to the clinic today with c/o urinary symptoms of dysuria and frequency x one day. She reports a tinge of pink on tissue yesterday.  She does have hx of uti.  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  She has not taken anything for relief.    Review of Systems  Past Medical History  Diagnosis Date  . Bursitis of left hip 8-13    takes Meloxicam  . Chicken pox as a child  . Measles as a child  . [place] measles as a child  . Mumps as a child  . Hiatal hernia 209  . Hyperlipidemia   . 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 Endometriosis   . Esophagitis   . Tubular adenoma of colon 11/2012    Family History  Problem Relation Age of Onset  . Diabetes Mother     type 2  . Hypertension Mother   . Heart disease Mother     CHF  . Cancer Mother     lung- previous smoker- had quit 12 yrs before  . Appendicitis Father     acute  . Cancer Father     prostate?  . Diabetes Brother 108    type 2  . Hypertension Brother   . Cancer Maternal Grandmother     breast, uterine  . Hypertension Maternal Grandmother   . Heart disease Maternal Grandmother     CHF  . Diabetes Maternal Grandmother   . Stroke Maternal Grandfather   . Heart block Paternal Grandfather     massive  . Colon cancer Neg Hx   . Stomach cancer Neg Hx     History   Social History  . Marital Status: Single    Spouse Name: N/A    Number of Children: N/A  . Years of Education: N/A   Occupational History  . Not on file.   Social History Main Topics  . Smoking status: Current Every Day Smoker -- 0.50 packs/day for 28 years    Types: Cigarettes  . Smokeless tobacco: Never Used  . Alcohol Use: Yes     Comment: occasional  . Drug Use: No  . Sexual Activity: Not Currently   Other  Topics Concern  . Not on file   Social History Narrative  . No narrative on file    Allergies  Allergen Reactions  . Statins     Muscle aches    Constitutional: Denies fever, malaise, fatigue, headache or abrupt weight changes.    No other specific complaints in a complete review of systems (except as listed in HPI above).    Objective:   Physical Exam  Filed Vitals:   08/26/14 1011  BP: 112/70  Pulse: 68  Temp: 97.9 F (36.6 C)    Wt 137 lb (62.143 kg) Wt Readings from Last 3 Encounters:  08/26/14 137 lb (62.143 kg)  07/13/14 131 lb 12 oz (59.761 kg)  01/07/14 127 lb (57.607 kg)    General: Appears her stated age, well developed, well nourished in NAD. Cardiovascular: Normal rate and rhythm. S1,S2 noted.  No murmur, rubs or gallops noted. Pulmonary/Chest: Normal effort and positive vesicular breath sounds. No respiratory distress. No wheezes, rales or  ronchi noted.  Abdomen: Soft and nontender. Normal bowel sounds, no bruits noted. No distention or masses noted. Liver, spleen and kidneys non palpable. Tender to palpation over the bladder area. No CVA tenderness.      Assessment & Plan:   Urinary frequency  Dysuria  Urinalysis: clear  Drink plenty of fluids  RTC as needed or if symptoms persist.   Review of Systems     Physical Exam

## 2014-08-26 NOTE — Patient Instructions (Signed)

## 2014-08-27 ENCOUNTER — Telehealth: Payer: Self-pay | Admitting: *Deleted

## 2014-08-27 ENCOUNTER — Other Ambulatory Visit: Payer: Self-pay | Admitting: Internal Medicine

## 2014-08-27 ENCOUNTER — Encounter: Payer: Self-pay | Admitting: Internal Medicine

## 2014-08-27 MED ORDER — CIPROFLOXACIN HCL 500 MG PO TABS: 500.0000 mg | ORAL_TABLET | Freq: Two times a day (BID) | ORAL | Status: DC

## 2014-08-27 NOTE — Telephone Encounter (Signed)
Pt called states she was seen yesterday at [facility] for UTI symptoms.  States today she is wiping blood, burning and is having frequency.  UA dip showed negative findings yesterday.  Pt is requesting to return to [facility] to submit another UA

## 2014-08-27 NOTE — Telephone Encounter (Signed)
This was taken care of by [facility] per e mail at 4 pm

## 2014-09-09 IMAGING — CT CT ABDOMEN W/ CM
1 of 3 series · 12 of 32 positions shown, 18 images · non-contrast
Comparison: none

REASON FOR EXAM: LABS 1st pancreatic cyst
COMMENTS:

[Series 4: dual art 3.0 i40f 3 · axial · 0.66mm/px · z∈[-970,-754]mm · 12 of 86 slices shown, 18 images]
[im 7/86  soft-tissue]
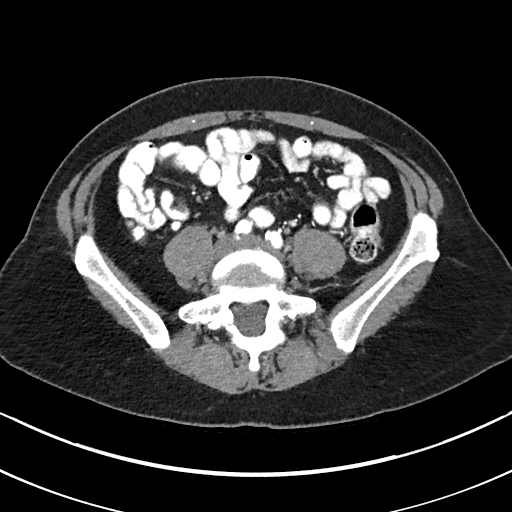
[im 7/86  bone]
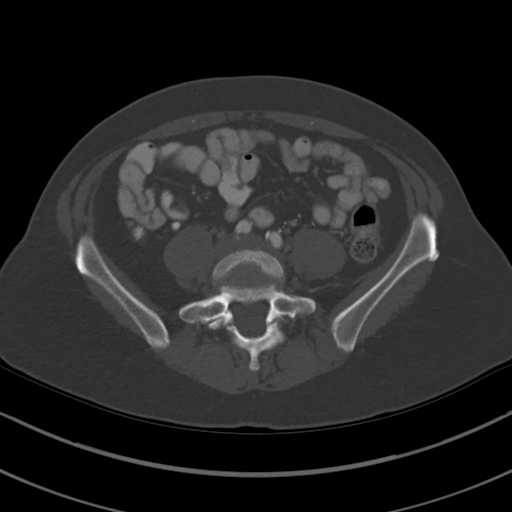
[im 14/86  soft-tissue]
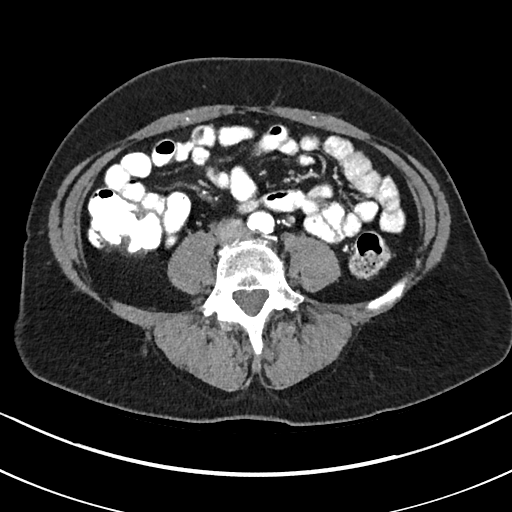
[im 20/86  soft-tissue]
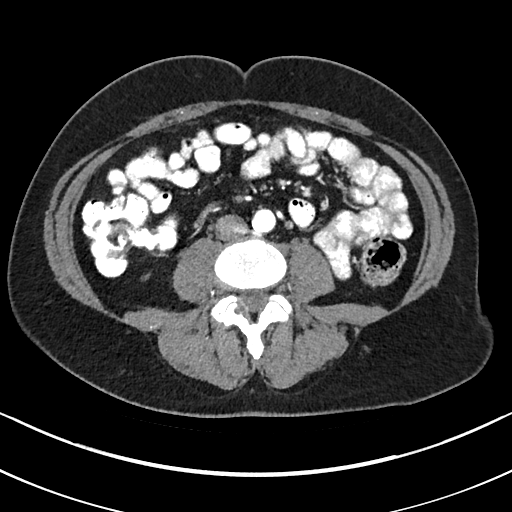
[im 27/86  soft-tissue]
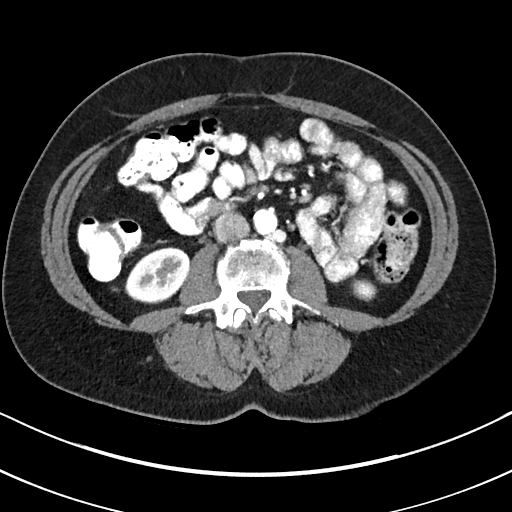
[im 33/86  soft-tissue]
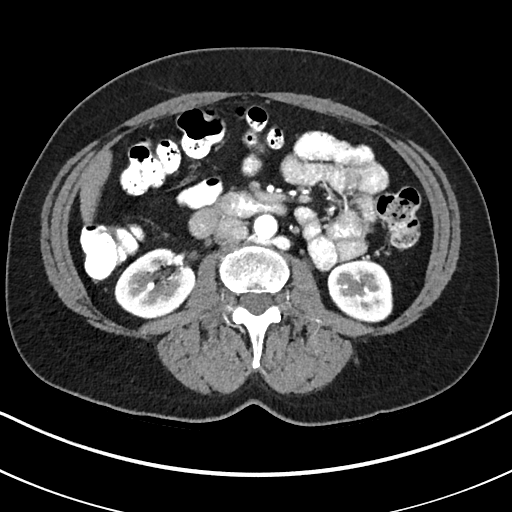
[im 40/86  soft-tissue]
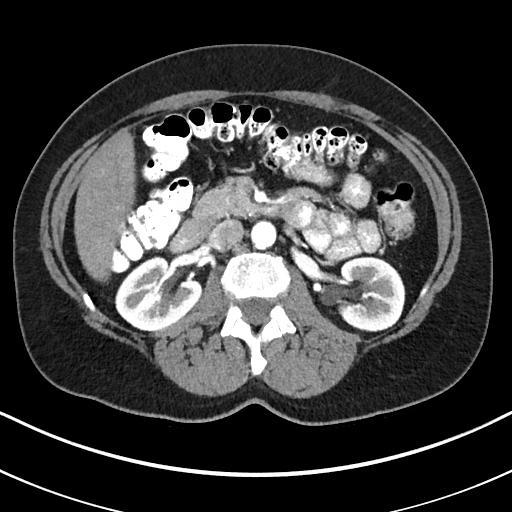
[im 46/86  soft-tissue]
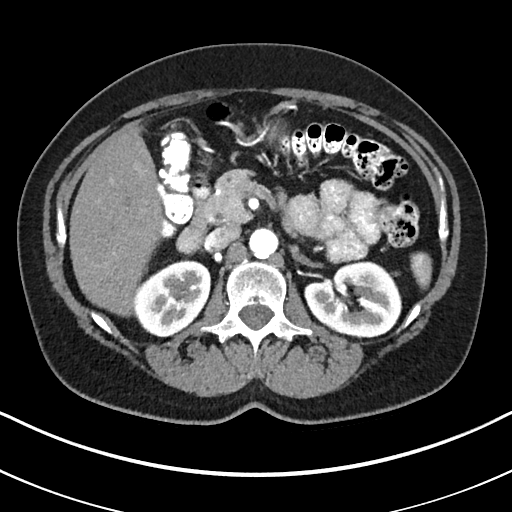
[im 53/86  soft-tissue]
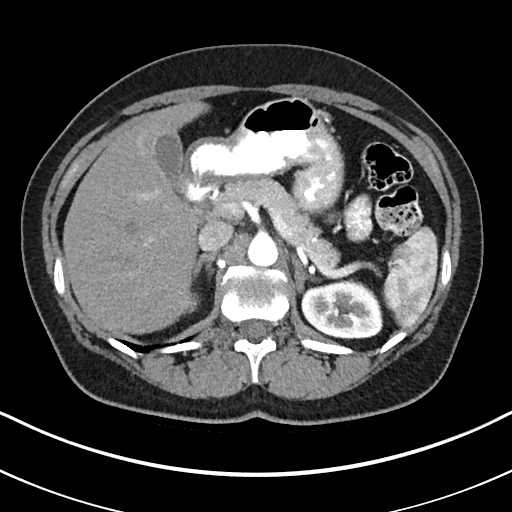
[im 59/86  soft-tissue]
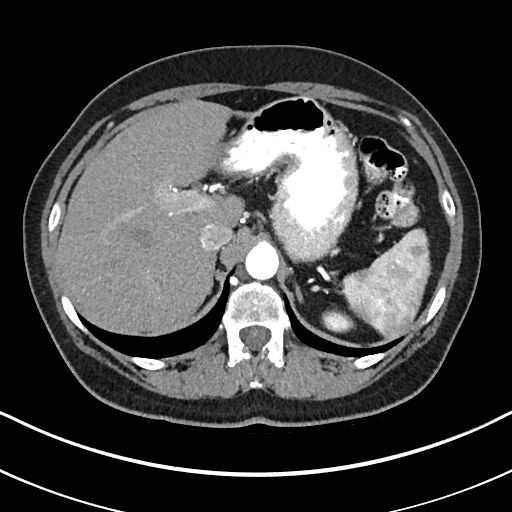
[im 59/86  lung]
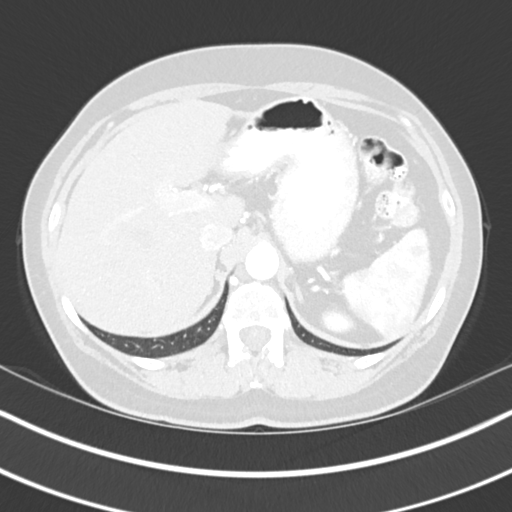
[im 59/86  bone]
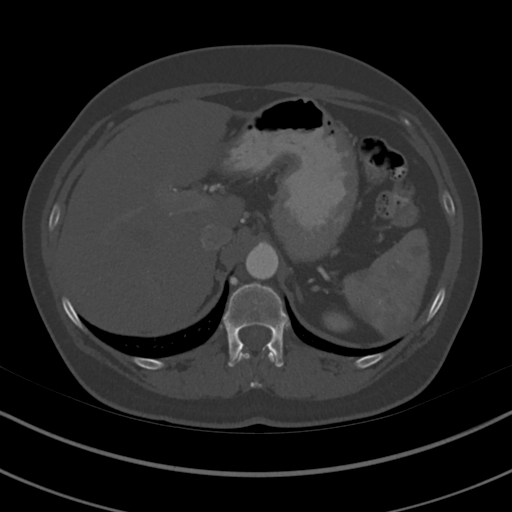
[im 66/86  soft-tissue]
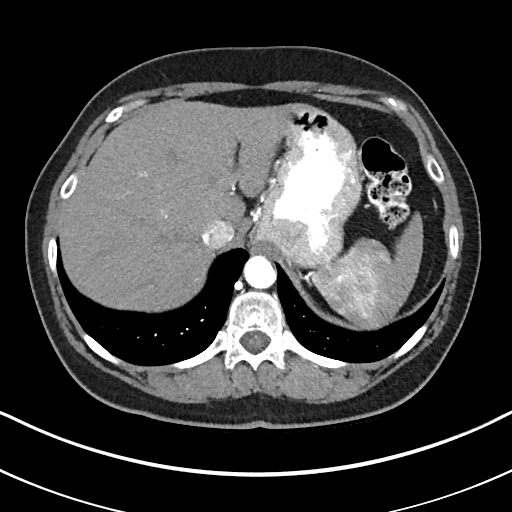
[im 66/86  lung]
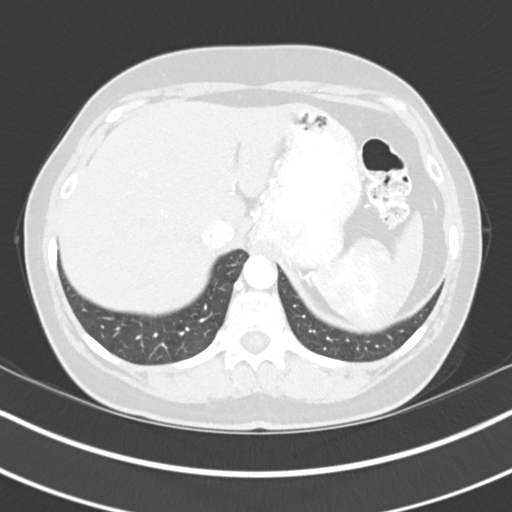
[im 72/86  soft-tissue]
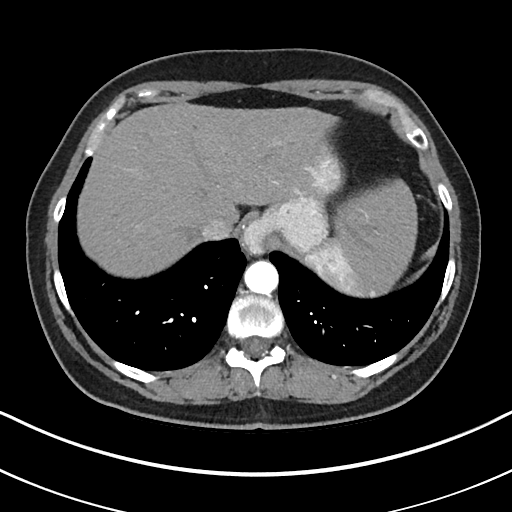
[im 72/86  lung]
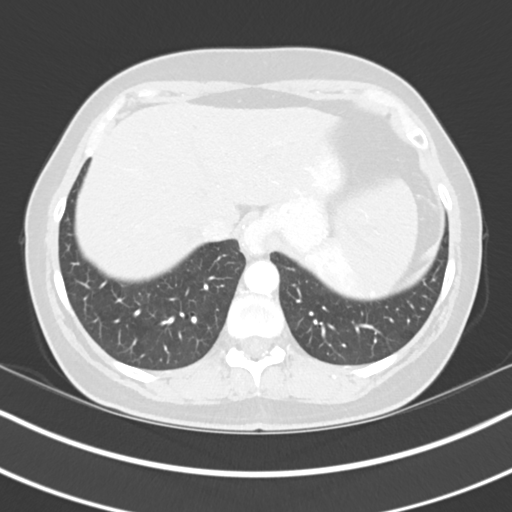
[im 79/86  soft-tissue]
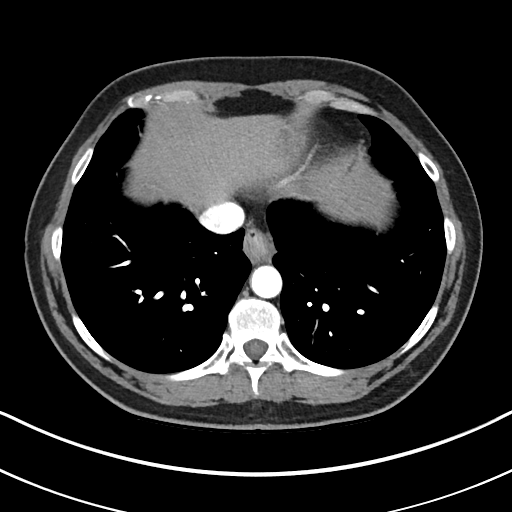
[im 79/86  lung]
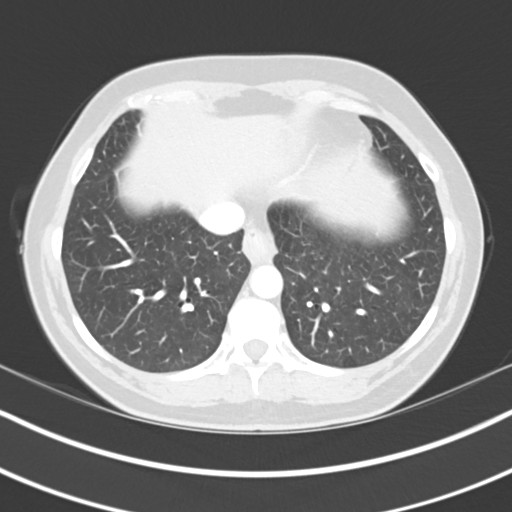

[12 of 32 positions shown; findings below may reference images not displayed]

PROCEDURE:     KCT - KCT ABDOMEN STANDARD W  - April 21, 2013 [DATE]

RESULT:     Axial CT scanning was performed through the abdomen at 3 mm
intervals and slice thicknesses following intravenous administration of 100
cc of Zsovue-L6X and also administration of oral contrast material.
Immediate and delayed images were obtained. Comparison is made to the study
October 24, 2012. Review of multiplanar reconstructed images was
performed separately on the VIA monitor.

Again demonstrated is an approximately 3 mm diameter hypodensity in the
anterior aspect of the proximal body of the pancreas. It is demonstrated
best on image 43 and appears stable. There are no pancreatic lesions
elsewhere. There is no intrapancreatic ductal dilation.

The liver exhibits mildly decreased density suggesting fatty infiltration.
There is no focal mass. There is a stable 5 mm subcapsular hypodensity in
the anterior aspect of the right hepatic lobe most compatible with a cyst.
The gallbladder is adequately distended with no evidence of stones. The
spleen is not enlarged. The stomach is partially distended and grossly
normal. There is a moderate sized hiatal hernia. The observed portions of
the small and large bowel exhibit no evidence of ileus or obstruction or
acute inflammation. There are no adrenal masses. The kidneys are normal in
density and contour and exhibit no evidence of obstruction. There is a
retroaortic left renal vein. The caliber of the abdominal aorta is normal.

On delayed images the appearance of the pancreas is unchanged. The lung
bases are clear. The observed portions of the lumbar spine exhibit no
compression fractures. The lung bases are clear.
IMPRESSION: 1. There is a stable appearance of the subcentimeter hypodensity anteriorly
in the proximal body of the pancreas. No pancreatic lesions elsewhere are
demonstrated.
2. There are fatty infiltrative changes of the liver and a stable presumed
cyst in the subcapsular region of the right lobe anteriorly. There is no
splenomegaly.
3. There is a moderate sized hiatal hernia. There is no acute abnormality of
the stomach were visualized portions of the bowel.
4. The kidneys and adrenal glands exhibit no acute abnormalities.

[REDACTED]

## 2014-10-22 ENCOUNTER — Encounter: Payer: Self-pay | Admitting: Internal Medicine

## 2014-10-23 ENCOUNTER — Other Ambulatory Visit: Payer: Self-pay | Admitting: Internal Medicine

## 2014-10-23 DIAGNOSIS — Z720 Tobacco use: Secondary | ICD-10-CM

## 2014-10-23 MED ORDER — VARENICLINE TARTRATE 0.5 MG X 11 & 1 MG X 42 PO MISC: ORAL | Status: DC

## 2014-10-23 MED ORDER — VARENICLINE TARTRATE 1 MG PO TABS: 1.0000 mg | ORAL_TABLET | Freq: Two times a day (BID) | ORAL | Status: DC

## 2014-11-02 ENCOUNTER — Other Ambulatory Visit: Payer: Self-pay | Admitting: *Deleted

## 2014-11-02 MED ORDER — ATORVASTATIN CALCIUM 20 MG PO TABS: 20.0000 mg | ORAL_TABLET | Freq: Every day | ORAL | Status: DC

## 2014-11-15 ENCOUNTER — Telehealth: Payer: Self-pay | Admitting: Internal Medicine

## 2014-11-15 DIAGNOSIS — Z7689 Persons encountering health services in other specified circumstances: Secondary | ICD-10-CM

## 2014-11-15 NOTE — Telephone Encounter (Signed)
Pt dropped off Health Screening Form. Form is in Dr. Lupita Dawn box.

## 2014-11-16 NOTE — Telephone Encounter (Signed)
In red folder. 

## 2014-11-18 NOTE — Telephone Encounter (Signed)
I do not have the mentioned form i in either red folder

## 2014-11-22 ENCOUNTER — Telehealth: Payer: Self-pay | Admitting: Internal Medicine

## 2014-11-22 NOTE — Telephone Encounter (Signed)
I found her form in my green folder,  And have completed the MD portion,  Its in the red folder now

## 2014-11-23 NOTE — Telephone Encounter (Signed)
Faxed form to insurance and copy to patient as requested and copy placed to scan.

## 2014-11-23 NOTE — Telephone Encounter (Signed)
Form faxed as requested.

## 2014-12-13 ENCOUNTER — Encounter: Payer: Self-pay | Admitting: Internal Medicine

## 2014-12-13 NOTE — Telephone Encounter (Signed)
FYI mychart message from patient.

## 2014-12-25 ENCOUNTER — Other Ambulatory Visit: Payer: Self-pay | Admitting: Internal Medicine

## 2015-01-11 ENCOUNTER — Other Ambulatory Visit: Payer: Self-pay | Admitting: Internal Medicine

## 2015-01-11 DIAGNOSIS — Z720 Tobacco use: Secondary | ICD-10-CM

## 2015-01-12 NOTE — Telephone Encounter (Signed)
Please call patient and confirm whether she wants to repeat the starter pack or move to the continuining pack

## 2015-01-12 NOTE — Telephone Encounter (Signed)
Spoke with pt, she states she needs the continuing pak.  She further states she has not had a cigarette in over 2 months.

## 2015-01-12 NOTE — Telephone Encounter (Signed)
Left message on VM to return call 

## 2015-01-13 MED ORDER — VARENICLINE TARTRATE 1 MG PO TABS: 1.0000 mg | ORAL_TABLET | Freq: Two times a day (BID) | ORAL | Status: DC

## 2015-01-13 NOTE — Telephone Encounter (Signed)
Continuing pak refilled x 3  Months.

## 2015-02-14 ENCOUNTER — Encounter: Payer: Self-pay | Admitting: Internal Medicine

## 2015-02-14 ENCOUNTER — Ambulatory Visit (INDEPENDENT_AMBULATORY_CARE_PROVIDER_SITE_OTHER): Payer: BLUE CROSS/BLUE SHIELD | Admitting: Internal Medicine

## 2015-02-14 ENCOUNTER — Telehealth: Payer: Self-pay | Admitting: Internal Medicine

## 2015-02-14 VITALS — BP 110/70 | HR 58 | Temp 97.5°F | Resp 16 | Ht 62.0 in | Wt 141.0 lb

## 2015-02-14 DIAGNOSIS — H539 Unspecified visual disturbance: Secondary | ICD-10-CM

## 2015-02-14 DIAGNOSIS — R51 Headache: Secondary | ICD-10-CM

## 2015-02-14 DIAGNOSIS — R519 Headache, unspecified: Secondary | ICD-10-CM

## 2015-02-14 DIAGNOSIS — I729 Aneurysm of unspecified site: Secondary | ICD-10-CM

## 2015-02-14 DIAGNOSIS — H532 Diplopia: Secondary | ICD-10-CM

## 2015-02-14 DIAGNOSIS — I671 Cerebral aneurysm, nonruptured: Secondary | ICD-10-CM

## 2015-02-14 DIAGNOSIS — Z87891 Personal history of nicotine dependence: Secondary | ICD-10-CM

## 2015-02-14 MED ORDER — ALPRAZOLAM 1 MG PO TABS: ORAL_TABLET | ORAL | Status: DC

## 2015-02-14 MED ORDER — ESCITALOPRAM OXALATE 5 MG PO TABS: 5.0000 mg | ORAL_TABLET | Freq: Every day | ORAL | Status: DC

## 2015-02-14 MED ORDER — MELOXICAM 15 MG PO TABS: 15.0000 mg | ORAL_TABLET | Freq: Every day | ORAL | Status: DC

## 2015-02-14 NOTE — Progress Notes (Signed)
Pre-visit discussion using our clinic review tool. No additional management support is needed unless otherwise documented below in the visit note.  

## 2015-02-14 NOTE — Assessment & Plan Note (Signed)
Etiology unclear,a but raises concern for enlarging aneurysm.  If MRI is not concerning,  wil refer to ophthalmology

## 2015-02-14 NOTE — Patient Instructions (Signed)
This is Dr. Chalese Peach's version of a  "Low GI"  Weight loss Diet.  It is appropriate for all patients with normal renal function , gluten tolerance, and advised for patients who have prediabetes or diabetes:   All of the foods can be found at grocery stores and in bulk at [employer].  The Atkins protein bars and shakes are available in more varieties at Target, WalMart and Lowe's Foods.     7 AM Breakfast:  Choose from the following:  < 5 carbs  Weekdays: Low carbohydrate Protein  Shakes (EAS AdvantEdge "Carb  Control" shakes, Atkins,  Muscle Milk or Premier Protein shakes)     Weekends:  a scrambled egg/bacon/cheese burrito made with Mission's "carb  balance" whole wheat tortilla  (about 10 net carbs )  Eggs,  bacon /sausage , Joseph's pita /lavash bread or  (5 carbs)  A slice of fritatta ( egg based baked dish, no  crust:  google it) (< 10 carbs)   Avoid cereal and bananas, oatmeal and cream of wheat and grits. They are loaded with carbohydrates!   10 AM: high protein snack  (< 5 carbs)   Protein bar by Atkins  Or KIND  (the snack size, < 200 cal, usually < 6 carbs    A stick of cheese:  Around 1 carb,  100 cal      Other so called "protein bars" tend to be loaded with carbohydrates.  Remember, in food advertising, the word "energy" is synonymous for " carbohydrate."  Lunch:   A Sandwich using the bread choices listed, Can use any  Eggs,  lunchmeat, grilled meat or canned tuna).  Can add avocado, regular mayo/mustard  and cheese.  A Salad using blue cheese, ranch,  Goddess dressing  or vinagrette,  No croutons or "confetti" and no "candied nuts" but regular nuts OK.   2 HARD BOILED EGG WHITES AND A CUP OF one of these greek yogurts:    dannon lt n fit greek yogurt         chobani 100 greek yogurt,    Oikos triple zero greek yogurt       No pretzels or chips.  Pickles and miniature sweet peppers are a good low  carb alternative that provide a "crunch"  The bread is the only source of  carbohydrate in a sandwich and  can be decreased by trying some of these alternatives to traditional loaf bread:   Joseph's pita bread and Lavash (flat) bread :  50 cal and 4 net carbs  available at BJs and WalMart.  Taste better when toasted, use as pita chips  Toufayan makes a variety of  flatbreads and  A PITA POCKET.    LOOK FOR  THE ONES THAT ARE 17 NET CARBS OR LESS    Mission makes 2 sizes of  Low carb whole wheat tortillas  (The large one is  210 cal and 6 net carbs)   Avoid "Low fat dressings, as well as [place] and [place] dressings    3 PM/ Mid day  Snack:  Consider  1 ounce of  almonds, walnuts, pistachios, pecans, peanuts,  Macadamia nuts or a nut medley that does not contain raisins or cranberries.  No "granola"; the dried cranberries and raisins are loaded with carbohydrates. Mixed nuts as long as there are no raisins,  cranberries or dried fruit.    Try the prosciutto/mozzarella cheese sticks by Fiorruci  In deli /backery section   High protein     To avoid overindulging in snacks: Try drinking a glass of unsweeted almond/coconut milk  Or a cup of coffee with your Atkins chocolate bar to keep you from having 3!!!   Pork rinds!  Yes Pork Rinds are low carb potato chip substitute!   Toasted Joseph's flatbread with hummous dip (chickpeas)       6 PM  Dinner:     Meat/fowl/fish with a green salad, and either broccoli, cauliflower, green beans, spinach, brussel sprouts, bok choy or  Lima beans. Fried in canola oil /olive oil BUT DO NOT BREAD THE PROTEIN!!      There is a low carb pasta by Dreamfield's that is acceptable and tastes great: only 5 digestible carbs/serving.( All grocery stores but BJs carry it )  Prepared Meals:  Try Michel Angelo's chicken piccata or chicken or eggplant parm over low carb pasta.(Lowes and BJs)   Aaron Sanchez's "Carnitas" (pulled pork, no sauce,  0 carbs) or his beef pot roast to make a dinner burrito (at BJ's)  Barbecue with cole slaw is low  carb BUT NO BUN!  SAME WITH HAMBURGERS     Whole wheat pasta is still full of digestible carbs and  Not as low in glycemic index as Dreamfield's.   Brown rice is still rice,  So skip the rice and noodles if you eat Chinese or Thai (or at least limit to 1/2 cup)  9 PM snack :   Breyer's "low carb" fudgsicle or  ice cream bar (Carb Smart line), or  Weight  Watcher's ice cream bar , or another "no sugar added" ice cream;  a serving of fresh berries/cherries with whipped cream   Cheese or greek yogurt   8 ounces of Blue Diamond unsweetened almond/cococunut milk  Cheese and crackers (using WASA crackers,  They are low carb) or peanut butter on low carb crackers or pita bread     Avoid bananas, pineapple, grapes  and watermelon on a regular basis because they are high in sugar.  THINK OF THEM AS DESSERT and do not have daily   Remember that snack Substitutions should be less than 10 NET carbs per serving and meals should be < 20 net carbs. Remember that carbohydrates from fiber do not affect blood sugar, so you can  subtract fiber grams to get the "net carbs " of any particular food item.  

## 2015-02-14 NOTE — Progress Notes (Signed)
Patient ID: Katrina Woods, female   DOB: December 27, 1955, 59 y.o.   MRN: [id number]  Patient Active Problem List   Diagnosis Date Noted  . Double vision 02/14/2015  . Headache, occipital 02/14/2015  . Rash and nonspecific skin eruption 07/19/2014  . Unspecified hereditary and idiopathic peripheral neuropathy 07/13/2014  . Statin intolerance 10/11/2013  . Fatty infiltration of liver 10/11/2013  . Routine general medical examination at a health care facility 04/17/2013  . Tubular adenoma of colon 12/03/2012  . Abdominal pain, chronic, right upper quadrant 10/16/2012  . GERD (gastroesophageal reflux disease) 10/16/2012  . History of tobacco abuse 09/12/2012  . Depression with anxiety 09/12/2012  . Back pain, thoracic 09/12/2012  . Bursitis of left hip   . Hiatal hernia   . Hyperlipidemia   . Aneurysm, cerebral, nonruptured     Subjective:  CC:   Chief Complaint  Patient presents with  . Acute Visit    Pain behind left eye, folowed by numbness.    HPI:   Katrina Woods is a 59 y.o. female who presents for  Sudden onset of severe pain behind her  left eye which occurred last week during  Yoga, class.  The pain has  not completely resolved, and now she reports a persistent pain in her left occipital region.   Feels that her vision has been a little blurred and she is seeing double images stacked vertically with far vision.  history of basilar artery aneurysm  That was followed serially by Dr Estanislado Pandy with angiograms,  But has not been imaged since 2009  Despite advide to repeat MRI/MRA in 2010.  v the eye and the tely gone away and vision in left eye has been altered,  Dis  Also notiing both hands going numb when driving car and with sleeping but this has been going on for over a year,      Quit smoking 3 months ago.    Past Medical History  Diagnosis Date  . Bursitis of left hip 8-13    takes Meloxicam  . Chicken pox as a child  . Measles as a child  . [place] measles as a  child  . Mumps as a child  . Hiatal hernia 209  . Hyperlipidemia   . 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 Endometriosis   . Esophagitis   . Tubular adenoma of colon 11/2012    Past Surgical History  Procedure Laterality Date  . Abdominal hysterectomy  2004    total  . Knee cartilage surgery  1987    Left knee- torn cartlidge  . Cesarean section  11-25-82  . Wisdom tooth extraction  1986    all four extracted  . Cerebral aneurysm repair  2006    Deveshwar, coil        The following portions of the patient's history were reviewed and updated as appropriate: Allergies, current medications, and problem list.    Review of Systems:   Patient denies headache, fevers, malaise, unintentional weight loss, skin rash, eye pain, sinus congestion and sinus pain, sore throat, dysphagia,  hemoptysis , cough, dyspnea, wheezing, chest pain, palpitations, orthopnea, edema, abdominal pain, nausea, melena, diarrhea, constipation, flank pain, dysuria, hematuria, urinary  Frequency, nocturia, numbness, tingling, seizures,  Focal weakness, Loss of consciousness,  Tremor, insomnia, depression, anxiety, and suicidal ideation.     History   Social History  . Marital Status: Single    Spouse Name: N/A  .  Number of Children: N/A  . Years of Education: N/A   Occupational History  . Not on file.   Social History Main Topics  . Smoking status: Former Smoker -- 0.50 packs/day for 28 years    Types: Cigarettes    Quit date: 11/08/2014  . Smokeless tobacco: Never Used  . Alcohol Use: 0.0 oz/week    0 Standard drinks or equivalent per week     Comment: occasional  . Drug Use: No  . Sexual Activity: Not Currently   Other Topics Concern  . Not on file   Social History Narrative    Objective:  Filed Vitals:   02/14/15 0813  BP: 110/70  Pulse: 58  Temp: 97.5 F (36.4 C)  Resp: 16     General appearance: alert, cooperative and appears stated age Ears:  normal TM's and external ear canals both ears Throat: lips, mucosa, and tongue normal; teeth and gums normal Neck: no adenopathy, no carotid bruit, supple, symmetrical, trachea midline and thyroid not enlarged, symmetric, no tenderness/mass/nodules Back: symmetric, no curvature. ROM normal. No CVA tenderness. Lungs: clear to auscultation bilaterally Heart: regular rate and rhythm, S1, S2 normal, no murmur, click, rub or gallop Abdomen: soft, non-tender; bowel sounds normal; no masses,  no organomegaly Pulses: 2+ and symmetric Skin: Skin color, texture, turgor normal. No rashes or lesions Lymph nodes: Cervical, supraclavicular, and axillary nodes normal. Neuro: CNs 2-12 intact. DTRs 2+/4 in biceps, brachioradialis, patellars and achilles. Muscle strength 5/5 in upper and lower exremities. Fine resting tremor bilaterally both hands cerebellar function normal. Romberg negative.  No pronator drift.   Gait normal.    Assessment and Plan:  Aneurysm, cerebral, nonruptured Basilar artery,  And has been lost to follow up for repeat imaging,  Now has persistnet paim behinfd left eye, occipital area, since last week . MRI/MRA    Double vision Etiology unclear,a but raises concern for enlarging aneurysm.  If MRI is not concerning,  wil refer to ophthalmology   Headache, occipital May be from cervical disk disesae vs enlarging basilar artery aneurysm.  MRI/MRA ordered.    History of tobacco abuse She quit =3 months ago,       Updated Medication List Outpatient Encounter Prescriptions as of 02/14/2015  Medication Sig  . ALPRAZolam (XANAX) 1 MG tablet TAKE 1 TABLET BY MOUTH AT BEDTIME AS NEEDED FOR SLEEP  . co-enzyme Q-10 30 MG capsule Take 1 capsule (30 mg total) by mouth daily.  Marland Kitchen escitalopram (LEXAPRO) 5 MG tablet Take 1 tablet (5 mg total) by mouth daily.  . meloxicam (MOBIC) 15 MG tablet Take 1 tablet (15 mg total) by mouth daily.  Marland Kitchen omeprazole (PRILOSEC) 40 MG capsule TAKE 1 CAPSULE (40  MG TOTAL) BY MOUTH DAILY.  . [DISCONTINUED] ALPRAZolam (XANAX) 1 MG tablet TAKE 1 TABLET BY MOUTH AT BEDTIME AS NEEDED FOR SLEEP  . [DISCONTINUED] atorvastatin (LIPITOR) 20 MG tablet Take 1 tablet (20 mg total) by mouth daily.  . [DISCONTINUED] ciprofloxacin (CIPRO) 500 MG tablet Take 1 tablet (500 mg total) by mouth 2 (two) times daily.  . [DISCONTINUED] escitalopram (LEXAPRO) 5 MG tablet TAKE 1 TABLET EVERY DAY  . [DISCONTINUED] gabapentin (NEURONTIN) 100 MG capsule Take 1 capsule (100 mg total) by mouth 3 (three) times daily.  . [DISCONTINUED] meloxicam (MOBIC) 15 MG tablet Take 1 tablet by mouth daily.  . [DISCONTINUED] varenicline (CHANTIX CONTINUING MONTH PAK) 1 MG tablet Take 1 tablet (1 mg total) by mouth 2 (two) times daily.  . [DISCONTINUED] varenicline (  CHANTIX CONTINUING MONTH PAK) 1 MG tablet Take 1 tablet (1 mg total) by mouth 2 (two) times daily.  . [DISCONTINUED] varenicline (CHANTIX STARTING MONTH PAK) 0.5 MG X 11 & 1 MG X 42 tablet Take one 0.5 mg tablet by mouth once daily for 3 days, then increase to one 0.5 mg tablet twice daily for 4 days, then increase to one 1 mg tablet twice daily.     Orders Placed This Encounter  Procedures  . MR MRA Head/Brain Wo Cm    No Follow-up on file.

## 2015-02-14 NOTE — Assessment & Plan Note (Signed)
May be from cervical disk disesae vs enlarging basilar artery aneurysm.  MRI/MRA ordered.

## 2015-02-14 NOTE — Assessment & Plan Note (Signed)
Basilar artery,  And has been lost to follow up for repeat imaging,  Now has persistnet paim behinfd left eye, occipital area, since last week . MRI/MRA

## 2015-02-14 NOTE — Assessment & Plan Note (Signed)
She quit =3 months ago,

## 2015-02-16 ENCOUNTER — Other Ambulatory Visit: Payer: Self-pay | Admitting: Internal Medicine

## 2015-02-16 DIAGNOSIS — R519 Headache, unspecified: Secondary | ICD-10-CM

## 2015-02-16 DIAGNOSIS — H532 Diplopia: Secondary | ICD-10-CM

## 2015-02-16 DIAGNOSIS — R51 Headache: Secondary | ICD-10-CM

## 2015-02-16 DIAGNOSIS — I671 Cerebral aneurysm, nonruptured: Secondary | ICD-10-CM

## 2015-02-17 ENCOUNTER — Ambulatory Visit: Payer: Self-pay | Admitting: Internal Medicine

## 2015-02-21 ENCOUNTER — Telehealth: Payer: Self-pay | Admitting: Internal Medicine

## 2015-03-02 ENCOUNTER — Ambulatory Visit (HOSPITAL_COMMUNITY)
Admission: RE | Admit: 2015-03-02 | Discharge: 2015-03-02 | Disposition: A | Discharge status: Home / Self Care | Payer: BLUE CROSS/BLUE SHIELD | Source: Ambulatory Visit | Attending: Interventional Radiology | Admitting: Interventional Radiology

## 2015-03-02 ENCOUNTER — Other Ambulatory Visit (HOSPITAL_COMMUNITY): Payer: Self-pay | Admitting: Interventional Radiology

## 2015-03-02 DIAGNOSIS — I671 Cerebral aneurysm, nonruptured: Secondary | ICD-10-CM

## 2015-03-07 ENCOUNTER — Other Ambulatory Visit: Payer: Self-pay | Admitting: Internal Medicine

## 2015-03-07 MED ORDER — ALPRAZOLAM 1 MG PO TABS: ORAL_TABLET | ORAL | Status: DC

## 2015-03-28 ENCOUNTER — Encounter: Payer: Self-pay | Admitting: Internal Medicine

## 2015-03-31 ENCOUNTER — Other Ambulatory Visit (HOSPITAL_COMMUNITY): Payer: Self-pay | Admitting: Interventional Radiology

## 2015-03-31 DIAGNOSIS — I671 Cerebral aneurysm, nonruptured: Secondary | ICD-10-CM

## 2015-04-06 ENCOUNTER — Ambulatory Visit (INDEPENDENT_AMBULATORY_CARE_PROVIDER_SITE_OTHER): Payer: BLUE CROSS/BLUE SHIELD | Admitting: Nurse Practitioner

## 2015-04-06 ENCOUNTER — Encounter: Payer: Self-pay | Admitting: Nurse Practitioner

## 2015-04-06 VITALS — BP 102/64 | HR 64 | Temp 97.6°F | Resp 14 | Ht 62.0 in | Wt 143.0 lb

## 2015-04-06 DIAGNOSIS — J069 Acute upper respiratory infection, unspecified: Secondary | ICD-10-CM

## 2015-04-06 DIAGNOSIS — T148 Other injury of unspecified body region: Secondary | ICD-10-CM | POA: Diagnosis not present

## 2015-04-06 DIAGNOSIS — W57XXXA Bitten or stung by nonvenomous insect and other nonvenomous arthropods, initial encounter: Secondary | ICD-10-CM | POA: Diagnosis not present

## 2015-04-06 DIAGNOSIS — B9789 Other viral agents as the cause of diseases classified elsewhere: Principal | ICD-10-CM

## 2015-04-06 MED ORDER — DOXYCYCLINE HYCLATE 100 MG PO TABS: 100.0000 mg | ORAL_TABLET | Freq: Two times a day (BID) | ORAL | Status: DC

## 2015-04-06 MED ORDER — PREDNISONE 10 MG PO TABS: ORAL_TABLET | ORAL | Status: DC

## 2015-04-06 NOTE — Patient Instructions (Addendum)
Prednisone with breakfast (don't spread throughout day)   6 tablets on day 1, 5 tablets on day 2, 4 tablets on day 3, 3 tablets on day 4, 2 tablets day 5, 1 tablet on day 6...done!   Take the doxycyline and make sure to wear sunscreen, eat yogurt for probiotics.   Follow up if not improved in 7 days.

## 2015-04-06 NOTE — Progress Notes (Signed)
   Subjective:    Patient ID: Katrina Woods, female    DOB: 06-28-1956, 59 y.o.   MRN: [id number]  HPI  [name] is a 59 yo female with a CC of sinusitis.   1)  Sore throat, sinus pressure, headache   3 ticks- this month on on back and one on right posterior thigh that has redness and pruritis. Not within 72 hours. Cortisone not helpful  Motrin- helpful   Review of Systems  Constitutional: Positive for diaphoresis and fatigue. Negative for fever and chills.  HENT: Positive for postnasal drip, sinus pressure and sore throat. Negative for congestion, ear discharge, ear pain, rhinorrhea, sneezing and trouble swallowing.   Eyes: Positive for pain and discharge.  Respiratory: Positive for cough and wheezing. Negative for chest tightness.   Cardiovascular: Negative for chest pain, palpitations and leg swelling.  Gastrointestinal: Positive for nausea. Negative for vomiting, abdominal pain and diarrhea.  Skin: Negative for rash.  Neurological: Positive for headaches.      Objective:   Physical Exam  Constitutional: She is oriented to person, place, and time. She appears well-developed and well-nourished. No distress.  BP 102/64 mmHg  Pulse 64  Temp(Src) 97.6 F (36.4 C) (Oral)  Resp 14  Ht 5\' 2"  (1.575 m)  Wt 143 lb (64.864 kg)  BMI 26.15 kg/m2  SpO2 98%   HENT:  Head: Normocephalic and atraumatic.  Right Ear: External ear normal.  Left Ear: External ear normal.  TM's clear bilaterally  Eyes: EOM are normal. Pupils are equal, round, and reactive to light. Right eye exhibits no discharge. Left eye exhibits no discharge. No scleral icterus.  Eyes red  Neck: Normal range of motion. Neck supple. No thyromegaly present.  Cardiovascular: Normal rate, regular rhythm and normal heart sounds.  Exam reveals no gallop and no friction rub.   No murmur heard. Pulmonary/Chest: Effort normal. No respiratory distress. She has wheezes. She has no rales. She exhibits no tenderness.    Lymphadenopathy:    She has no cervical adenopathy.  Neurological: She is alert and oriented to person, place, and time. No cranial nerve deficit. She exhibits normal muscle tone. Coordination normal.  Skin: Skin is warm and dry. No rash noted. She is not diaphoretic.  Psychiatric: She has a normal mood and affect. Her behavior is normal. Judgment and thought content normal.      Assessment & Plan:   Tick bite  1) None within 72 hours, one site is red and pruritic as well as raised   Will try 200 mg of doxycyline  2) Encouraged yogurt for probiotics during that one day   Viral URI w/ cough  1) Prednisone taper for itching, pressure, and wheezing 2) Saline Nasal spray and Motrin for OTC relief  3) FU in 7 days if not helpful

## 2015-04-06 NOTE — Progress Notes (Signed)
Pre visit review using our clinic review tool, if applicable. No additional management support is needed unless otherwise documented below in the visit note. 

## 2015-05-02 ENCOUNTER — Other Ambulatory Visit: Payer: Self-pay | Admitting: Radiology

## 2015-05-02 ENCOUNTER — Other Ambulatory Visit: Payer: Self-pay | Admitting: Physician Assistant

## 2015-05-04 NOTE — Pre-Procedure Instructions (Signed)
Katrina Woods  05/04/2015      [id number] [address], [place] [address] Phone: [phone] Fax: [phone]    Your procedure is scheduled on Wed, June 1 @ 8:30 AM  [facility]  at 6:30 AM  Call this number if you have problems the morning of surgery:  [phone]   Remember:  Do not eat food or drink liquids after midnight.  Take these medicines the morning of surgery with A SIP OF WATER Plavix(Clopidogrel),Escitalopram(Lexapro),and Omeprazole(Prilosec)               Stop taking your Mobic. No Goody's,BC's,Aleve,Ibuprofen,Fish Oil,or any Herbal Medications.    Do not wear jewelry, make-up or nail polish.  Do not wear lotions, powders, or perfumes.  You may wear deodorant.  Do not shave 48 hours prior to surgery.    Do not bring valuables to the hospital.  [facility] is not responsible for any belongings or valuables.  Contacts, dentures or bridgework may not be worn into surgery.  Leave your suitcase in the car.  After surgery it may be brought to your room.  For patients admitted to the hospital, discharge time will be determined by your treatment team.  Patients discharged the day of surgery will not be allowed to drive home.    Special instructions:  [facility] - Preparing for Surgery  Before surgery, you can play an important role.  Because skin is not sterile, your skin needs to be as free of germs as possible.  You can reduce the number of germs on you skin by washing with CHG (chlorahexidine gluconate) soap before surgery.  CHG is an antiseptic cleaner which kills germs and bonds with the skin to continue killing germs even after washing.  Please DO NOT use if you have an allergy to CHG or antibacterial soaps.  If your skin becomes reddened/irritated stop using the CHG and inform your nurse when you arrive at Short Stay.  Do not shave (including legs and underarms) for at least 48  hours prior to the first CHG shower.  You may shave your face.  Please follow these instructions carefully:   1.  Shower with CHG Soap the night before surgery and the                                morning of Surgery.  2.  If you choose to wash your hair, wash your hair first as usual with your       normal shampoo.  3.  After you shampoo, rinse your hair and body thoroughly to remove the                      Shampoo.  4.  Use CHG as you would any other liquid soap.  You can apply chg directly       to the skin and wash gently with scrungie or a clean washcloth.  5.  Apply the CHG Soap to your body ONLY FROM THE NECK DOWN.        Do not use on open wounds or open sores.  Avoid contact with your eyes,       ears, mouth and genitals (private parts).  Wash genitals (private parts)       with your normal soap.  6.  Wash thoroughly, paying special  attention to the area where your surgery        will be performed.  7.  Thoroughly rinse your body with warm water from the neck down.  8.  DO NOT shower/wash with your normal soap after using and rinsing off       the CHG Soap.  9.  Pat yourself dry with a clean towel.            10.  Wear clean pajamas.            11.  Place clean sheets on your bed the night of your first shower and do not        sleep with pets.  Day of Surgery  Do not apply any lotions/deoderants the morning of surgery.  Please wear clean clothes to the hospital/surgery center.    Please read over the following fact sheets that you were given. Pain Booklet, Coughing and Deep Breathing and Surgical Site Infection Prevention

## 2015-05-05 ENCOUNTER — Encounter (HOSPITAL_COMMUNITY): Payer: Self-pay

## 2015-05-05 ENCOUNTER — Encounter (HOSPITAL_COMMUNITY)
Admission: RE | Admit: 2015-05-05 | Discharge: 2015-05-05 | Disposition: A | Discharge status: Home / Self Care | Payer: BLUE CROSS/BLUE SHIELD | Source: Ambulatory Visit | Attending: Interventional Radiology | Admitting: Interventional Radiology

## 2015-05-05 ENCOUNTER — Other Ambulatory Visit (HOSPITAL_COMMUNITY): Payer: BLUE CROSS/BLUE SHIELD

## 2015-05-05 HISTORY — DX: Dorsalgia, unspecified: M54.9

## 2015-05-05 HISTORY — DX: Headache: R51

## 2015-05-05 HISTORY — DX: Personal history of other infectious and parasitic diseases: Z86.19

## 2015-05-05 HISTORY — DX: Personal history of colon polyps, unspecified: Z86.0100

## 2015-05-05 HISTORY — DX: Personal history of other diseases of the respiratory system: Z87.09

## 2015-05-05 HISTORY — DX: Depression, unspecified: F32.A

## 2015-05-05 HISTORY — DX: Personal history of colonic polyps: Z86.010

## 2015-05-05 HISTORY — DX: Gastro-esophageal reflux disease without esophagitis: K21.9

## 2015-05-05 HISTORY — DX: Weakness: R53.1

## 2015-05-05 HISTORY — DX: Insomnia, unspecified: G47.00

## 2015-05-05 HISTORY — DX: Headache, unspecified: R51.9

## 2015-05-05 HISTORY — DX: Major depressive disorder, single episode, unspecified: F32.9

## 2015-05-05 HISTORY — DX: Personal history of urinary calculi: Z87.442

## 2015-05-05 LAB — COMPREHENSIVE METABOLIC PANEL
ALBUMIN: 3.9 g/dL (ref 3.5–5.0)
ALT: 12 U/L — AB (ref 14–54)
AST: 21 U/L (ref 15–41)
Alkaline Phosphatase: 49 U/L (ref 38–126)
Anion gap: 8 (ref 5–15)
BUN: 14 mg/dL (ref 6–20)
CO2: 24 mmol/L (ref 22–32)
Calcium: 9.8 mg/dL (ref 8.9–10.3)
Chloride: 106 mmol/L (ref 101–111)
Creatinine, Ser: 0.82 mg/dL (ref 0.44–1.00)
Glucose, Bld: 98 mg/dL (ref 65–99)
POTASSIUM: 4.4 mmol/L (ref 3.5–5.1)
SODIUM: 138 mmol/L (ref 135–145)
TOTAL PROTEIN: 6.4 g/dL — AB (ref 6.5–8.1)
Total Bilirubin: 0.6 mg/dL (ref 0.3–1.2)

## 2015-05-05 LAB — CBC WITH DIFFERENTIAL/PLATELET
BASOS ABS: 0.1 10*3/uL (ref 0.0–0.1)
Basophils Relative: 2 % — ABNORMAL HIGH (ref 0–1)
Eosinophils Absolute: 0.1 10*3/uL (ref 0.0–0.7)
Eosinophils Relative: 2 % (ref 0–5)
HCT: 40.5 % (ref 36.0–46.0)
HEMOGLOBIN: 13.5 g/dL (ref 12.0–15.0)
LYMPHS ABS: 1.9 10*3/uL (ref 0.7–4.0)
Lymphocytes Relative: 35 % (ref 12–46)
MCH: 30.1 pg (ref 26.0–34.0)
MCHC: 33.3 g/dL (ref 30.0–36.0)
MCV: 90.2 fL (ref 78.0–100.0)
Monocytes Absolute: 0.5 10*3/uL (ref 0.1–1.0)
Monocytes Relative: 9 % (ref 3–12)
Neutro Abs: 2.8 10*3/uL (ref 1.7–7.7)
Neutrophils Relative %: 52 % (ref 43–77)
Platelets: 128 10*3/uL — ABNORMAL LOW (ref 150–400)
RBC: 4.49 MIL/uL (ref 3.87–5.11)
RDW: 13.2 % (ref 11.5–15.5)
WBC: 5.4 10*3/uL (ref 4.0–10.5)

## 2015-05-05 LAB — APTT: APTT: 29 s (ref 24–37)

## 2015-05-05 LAB — PROTIME-INR
INR: 0.97 (ref 0.00–1.49)
PROTHROMBIN TIME: 13.1 s (ref 11.6–15.2)

## 2015-05-05 NOTE — Progress Notes (Addendum)
Pt doesn't have a Cardiologist  Medical Md is Dr.Teresa Tullo  Echo done > 5 yrs ago  Stress test  Done > 5 yrs ago-was done for routine physical  Denies ever having Heart cath  No EKG in > 66yrs ago  Denies CXR in past yr

## 2015-05-11 ENCOUNTER — Ambulatory Visit (HOSPITAL_COMMUNITY): Payer: BLUE CROSS/BLUE SHIELD | Admitting: Anesthesiology

## 2015-05-11 ENCOUNTER — Encounter (HOSPITAL_COMMUNITY)
Admission: AD | Disposition: A | Discharge status: Home / Self Care | Payer: Self-pay | Source: Ambulatory Visit | Attending: Interventional Radiology

## 2015-05-11 ENCOUNTER — Encounter (HOSPITAL_COMMUNITY): Payer: Self-pay

## 2015-05-11 ENCOUNTER — Inpatient Hospital Stay (HOSPITAL_COMMUNITY)
Admission: AD | Admit: 2015-05-11 | Discharge: 2015-05-12 | DRG: 027 | Disposition: A | Discharge status: Home / Self Care | Payer: BLUE CROSS/BLUE SHIELD | Source: Ambulatory Visit | Attending: Interventional Radiology | Admitting: Interventional Radiology

## 2015-05-11 ENCOUNTER — Ambulatory Visit (HOSPITAL_COMMUNITY)
Admission: RE | Admit: 2015-05-11 | Discharge: 2015-05-11 | Disposition: A | Discharge status: Home / Self Care | Payer: BLUE CROSS/BLUE SHIELD | Source: Ambulatory Visit | Attending: Interventional Radiology | Admitting: Interventional Radiology

## 2015-05-11 DIAGNOSIS — Z7902 Long term (current) use of antithrombotics/antiplatelets: Secondary | ICD-10-CM | POA: Diagnosis not present

## 2015-05-11 DIAGNOSIS — I671 Cerebral aneurysm, nonruptured: Secondary | ICD-10-CM

## 2015-05-11 DIAGNOSIS — K219 Gastro-esophageal reflux disease without esophagitis: Secondary | ICD-10-CM | POA: Diagnosis present

## 2015-05-11 DIAGNOSIS — E785 Hyperlipidemia, unspecified: Secondary | ICD-10-CM | POA: Diagnosis present

## 2015-05-11 DIAGNOSIS — F329 Major depressive disorder, single episode, unspecified: Secondary | ICD-10-CM | POA: Diagnosis present

## 2015-05-11 DIAGNOSIS — Z90710 Acquired absence of both cervix and uterus: Secondary | ICD-10-CM

## 2015-05-11 DIAGNOSIS — Z79899 Other long term (current) drug therapy: Secondary | ICD-10-CM | POA: Diagnosis not present

## 2015-05-11 DIAGNOSIS — I729 Aneurysm of unspecified site: Secondary | ICD-10-CM

## 2015-05-11 HISTORY — PX: RADIOLOGY WITH ANESTHESIA: SHX6223

## 2015-05-11 LAB — POCT ACTIVATED CLOTTING TIME
ACTIVATED CLOTTING TIME: 183 s
ACTIVATED CLOTTING TIME: 184 s
Activated Clotting Time: 134 seconds
Activated Clotting Time: 177 seconds

## 2015-05-11 LAB — PLATELET INHIBITION P2Y12: Platelet Function  P2Y12: 71 [PRU] — ABNORMAL LOW (ref 194–418)

## 2015-05-11 LAB — HEPARIN LEVEL (UNFRACTIONATED): Heparin Unfractionated: 0.18 IU/mL — ABNORMAL LOW (ref 0.30–0.70)

## 2015-05-11 LAB — MRSA PCR SCREENING: MRSA by PCR: NEGATIVE

## 2015-05-11 SURGERY — RADIOLOGY WITH ANESTHESIA
Anesthesia: General

## 2015-05-11 MED ORDER — NEOSTIGMINE METHYLSULFATE 10 MG/10ML IV SOLN
INTRAVENOUS | Status: DC | PRN
  Administered 2015-05-11: 4 mg via INTRAVENOUS

## 2015-05-11 MED ORDER — EPHEDRINE SULFATE 50 MG/ML IJ SOLN
INTRAMUSCULAR | Status: DC | PRN
  Administered 2015-05-11: 5 mg via INTRAVENOUS

## 2015-05-11 MED ORDER — ONDANSETRON HCL 4 MG/2ML IJ SOLN: 4.0000 mg | Freq: Four times a day (QID) | INTRAMUSCULAR | Status: DC | PRN

## 2015-05-11 MED ORDER — GLYCOPYRROLATE 0.2 MG/ML IJ SOLN
INTRAMUSCULAR | Status: DC | PRN
  Administered 2015-05-11: 0.6 mg via INTRAVENOUS

## 2015-05-11 MED ORDER — HEPARIN (PORCINE) IN NACL 100-0.45 UNIT/ML-% IJ SOLN
INTRAMUSCULAR | Status: AC
  Filled 2015-05-11: qty 250

## 2015-05-11 MED ORDER — SODIUM CHLORIDE 0.9 % IV BOLUS (SEPSIS)
250.0000 mL | Freq: Once | INTRAVENOUS | Status: AC
  Administered 2015-05-11: 250 mL via INTRAVENOUS

## 2015-05-11 MED ORDER — SODIUM CHLORIDE 0.9 % IV SOLN
Freq: Once | INTRAVENOUS | Status: AC
  Administered 2015-05-11: 08:00:00 via INTRAVENOUS

## 2015-05-11 MED ORDER — NICARDIPINE HCL IN NACL 20-0.86 MG/200ML-% IV SOLN
5.0000 mg/h | INTRAVENOUS | Status: DC
  Filled 2015-05-11: qty 200

## 2015-05-11 MED ORDER — IOHEXOL 300 MG/ML  SOLN
150.0000 mL | Freq: Once | INTRAMUSCULAR | Status: AC | PRN
  Administered 2015-05-11: 78 mL via INTRAVENOUS

## 2015-05-11 MED ORDER — NITROGLYCERIN 1 MG/10 ML FOR IR/CATH LAB
INTRA_ARTERIAL | Status: AC
  Filled 2015-05-11: qty 10

## 2015-05-11 MED ORDER — CEFAZOLIN SODIUM-DEXTROSE 2-3 GM-% IV SOLR
2.0000 g | Freq: Once | INTRAVENOUS | Status: AC
  Administered 2015-05-11: 2 g via INTRAVENOUS
  Filled 2015-05-11: qty 50

## 2015-05-11 MED ORDER — SODIUM CHLORIDE 0.9 % IV SOLN
INTRAVENOUS | Status: DC | PRN
  Administered 2015-05-11 (×2): via INTRAVENOUS

## 2015-05-11 MED ORDER — NICARDIPINE HCL IN NACL 20-0.86 MG/200ML-% IV SOLN: 5.0000 mg/h | INTRAVENOUS | Status: DC

## 2015-05-11 MED ORDER — ONDANSETRON HCL 4 MG/2ML IJ SOLN
INTRAMUSCULAR | Status: DC | PRN
  Administered 2015-05-11: 4 mg via INTRAVENOUS

## 2015-05-11 MED ORDER — ACETAMINOPHEN 650 MG RE SUPP: 650.0000 mg | Freq: Four times a day (QID) | RECTAL | Status: DC | PRN

## 2015-05-11 MED ORDER — SODIUM CHLORIDE 0.9 % IV SOLN
INTRAVENOUS | Status: DC
  Administered 2015-05-11: 20:00:00 via INTRAVENOUS

## 2015-05-11 MED ORDER — ROCURONIUM BROMIDE 100 MG/10ML IV SOLN
INTRAVENOUS | Status: DC | PRN
  Administered 2015-05-11: 40 mg via INTRAVENOUS
  Administered 2015-05-11: 10 mg via INTRAVENOUS

## 2015-05-11 MED ORDER — LIDOCAINE HCL 1 % IJ SOLN
INTRAMUSCULAR | Status: AC
  Filled 2015-05-11: qty 20

## 2015-05-11 MED ORDER — PROPOFOL 10 MG/ML IV BOLUS
INTRAVENOUS | Status: DC | PRN
  Administered 2015-05-11: 150 mg via INTRAVENOUS

## 2015-05-11 MED ORDER — MIDAZOLAM HCL 5 MG/5ML IJ SOLN
INTRAMUSCULAR | Status: DC | PRN
  Administered 2015-05-11 (×2): 1 mg via INTRAVENOUS

## 2015-05-11 MED ORDER — OXYCODONE HCL 5 MG/5ML PO SOLN
5.0000 mg | Freq: Once | ORAL | Status: DC | PRN
Start: 2015-05-11 — End: 2015-05-11

## 2015-05-11 MED ORDER — CLOPIDOGREL BISULFATE 75 MG PO TABS
75.0000 mg | ORAL_TABLET | Freq: Once | ORAL | Status: AC
  Administered 2015-05-11: 75 mg via ORAL
  Filled 2015-05-11: qty 1

## 2015-05-11 MED ORDER — SODIUM CHLORIDE 0.9 % IV SOLN
10.0000 mg | INTRAVENOUS | Status: DC | PRN
  Administered 2015-05-11: 25 ug/min via INTRAVENOUS

## 2015-05-11 MED ORDER — HEPARIN (PORCINE) IN NACL 100-0.45 UNIT/ML-% IJ SOLN: 500.0000 [IU]/h | INTRAMUSCULAR | Status: DC

## 2015-05-11 MED ORDER — LIDOCAINE HCL (CARDIAC) 20 MG/ML IV SOLN
INTRAVENOUS | Status: DC | PRN
  Administered 2015-05-11: 100 mg via INTRAVENOUS

## 2015-05-11 MED ORDER — HYDROCODONE-ACETAMINOPHEN 5-325 MG PO TABS
1.0000 | ORAL_TABLET | Freq: Four times a day (QID) | ORAL | Status: DC | PRN
  Administered 2015-05-11 (×2): 1 via ORAL
  Administered 2015-05-12 (×2): 2 via ORAL
  Filled 2015-05-11: qty 2
  Filled 2015-05-11: qty 1
  Filled 2015-05-11: qty 2
  Filled 2015-05-11: qty 1

## 2015-05-11 MED ORDER — ASPIRIN 325 MG PO TABS
325.0000 mg | ORAL_TABLET | Freq: Every day | ORAL | Status: DC
  Administered 2015-05-12: 325 mg via ORAL
  Filled 2015-05-11 (×2): qty 1

## 2015-05-11 MED ORDER — IOHEXOL 300 MG/ML  SOLN
150.0000 mL | Freq: Once | INTRAMUSCULAR | Status: AC | PRN
  Administered 2015-05-11: 60 mL via INTRAVENOUS

## 2015-05-11 MED ORDER — CLOPIDOGREL BISULFATE 75 MG PO TABS
75.0000 mg | ORAL_TABLET | Freq: Every day | ORAL | Status: DC
  Administered 2015-05-12: 75 mg via ORAL
  Filled 2015-05-11 (×2): qty 1

## 2015-05-11 MED ORDER — OXYCODONE HCL 5 MG PO TABS: 5.0000 mg | ORAL_TABLET | Freq: Once | ORAL | Status: DC | PRN

## 2015-05-11 MED ORDER — HEPARIN SODIUM (PORCINE) 1000 UNIT/ML IJ SOLN
INTRAMUSCULAR | Status: DC | PRN
  Administered 2015-05-11: 500 [IU] via INTRAVENOUS
  Administered 2015-05-11: 2000 [IU] via INTRAVENOUS
  Administered 2015-05-11: 1000 [IU] via INTRAVENOUS
  Administered 2015-05-11: 500 [IU] via INTRAVENOUS

## 2015-05-11 MED ORDER — NIMODIPINE 30 MG PO CAPS
60.0000 mg | ORAL_CAPSULE | ORAL | Status: DC
  Filled 2015-05-11: qty 2

## 2015-05-11 MED ORDER — FENTANYL CITRATE (PF) 100 MCG/2ML IJ SOLN
25.0000 ug | INTRAMUSCULAR | Status: DC | PRN
  Administered 2015-05-11 (×2): 25 ug via INTRAVENOUS

## 2015-05-11 MED ORDER — ASPIRIN EC 325 MG PO TBEC
325.0000 mg | DELAYED_RELEASE_TABLET | Freq: Once | ORAL | Status: AC
  Administered 2015-05-11: 325 mg via ORAL
  Filled 2015-05-11: qty 1

## 2015-05-11 MED ORDER — HEPARIN (PORCINE) IN NACL 100-0.45 UNIT/ML-% IJ SOLN
500.0000 [IU]/h | INTRAMUSCULAR | Status: DC
  Administered 2015-05-11: 500 [IU]/h via INTRAVENOUS
  Filled 2015-05-11: qty 250

## 2015-05-11 MED ORDER — FENTANYL CITRATE (PF) 100 MCG/2ML IJ SOLN
INTRAMUSCULAR | Status: AC
  Filled 2015-05-11: qty 2

## 2015-05-11 MED ORDER — ACETAMINOPHEN 500 MG PO TABS
1000.0000 mg | ORAL_TABLET | Freq: Four times a day (QID) | ORAL | Status: DC | PRN
  Administered 2015-05-11: 1000 mg via ORAL
  Filled 2015-05-11: qty 2

## 2015-05-11 MED ORDER — FENTANYL CITRATE (PF) 100 MCG/2ML IJ SOLN
INTRAMUSCULAR | Status: DC | PRN
  Administered 2015-05-11: 50 ug via INTRAVENOUS
  Administered 2015-05-11 (×5): 25 ug via INTRAVENOUS

## 2015-05-11 NOTE — Progress Notes (Signed)
    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 Authorizing Provider  acetaminophen (TYLENOL) 500 MG tablet Take 500 mg by mouth every 6 (six) hours as needed for headache (pain).   Yes Historical Provider, MD  ALPRAZolam Duanne Moron) 1 MG tablet TAKE 1 TABLET BY MOUTH AT BEDTIME for insomnia 03/07/15  Yes Crecencio Mc, MD  atorvastatin (LIPITOR) 20 MG tablet Take 20 mg by mouth daily.   Yes Historical Provider, MD  clopidogrel (PLAVIX) 75 MG tablet Take 75 mg by mouth daily.   Yes Historical Provider, MD  escitalopram (LEXAPRO) 5 MG tablet Take 1 tablet (5 mg total) by mouth daily. 02/14/15  Yes Crecencio Mc, MD  meloxicam (MOBIC) 15 MG tablet Take 1 tablet (15 mg total) by mouth daily. 02/14/15  Yes Crecencio Mc, MD  omeprazole (PRILOSEC) 40 MG capsule TAKE 1 CAPSULE (40 MG TOTAL) BY MOUTH DAILY. 08/02/14  Yes Crecencio Mc, MD  co-enzyme Q-10 30 MG capsule Take 1 capsule (30 mg total) by mouth daily. Patient not taking: Reported on 05/02/2015 10/12/13   Crecencio Mc, MD     Vital Signs: BP 91/57 mmHg  Pulse 56  Temp(Src) 97.5 F (36.4 C) (Oral)  Resp 10  Wt 64.411 kg (142 lb)  SpO2 100%  Physical Exam  Constitutional: She is oriented to person, place, and time.  HENT:  Face symmetrical Smile = Puffs cheeks = Good strength and sensation  Abdominal: Soft.  Rt groin NT; no bleeding No hematoma  Musculoskeletal: Normal range of motion.  Rt foot 2+ pulses  Neurological: She is alert and oriented to person, place, and time.  Skin: Skin is warm and dry.  Psychiatric: She has a normal mood and affect. Her behavior is normal. Judgment and thought content  normal.    Imaging: No results found.  Labs:  CBC:  Recent Labs  07/21/14 0832 05/05/15 1031  WBC 8.4 5.4  HGB 14.4 13.5  HCT 42.4 40.5  PLT 234.0 128*    COAGS:  Recent Labs  05/05/15 1031  INR 0.97  APTT 29    BMP:  Recent Labs  07/21/14 0832 05/05/15 1031  NA 139 138  K 4.5 4.4  CL 105 106  CO2 25 24  GLUCOSE 88 98  BUN 16 14  CALCIUM 9.6 9.8  CREATININE 1.0 0.82  GFRNONAA  --  >60  GFRAA  --  >60    LIVER FUNCTION TESTS:  Recent Labs  07/21/14 0832 05/05/15 1031  BILITOT 0.5 0.6  AST 18 21  ALT 11 12*  ALKPHOS 54 49  PROT 6.9 6.4*  ALBUMIN 3.8 3.9    Assessment and Plan:  Basilar tip aneurysm LVIS stent placement Plan for dc in am if stable Dr Estanislado Pandy will see pt this pm--address headache  Signed: Sparsh Callens A 05/11/2015, 4:23 PM   I spent a total of 15 Minutes in face to face in clinical consultation/evaluation, greater than 50% of which was counseling/coordinating care for basilar tip aneurysm embolization

## 2015-05-11 NOTE — Progress Notes (Addendum)
ANTICOAGULATION CONSULT NOTE - Initial Consult  Pharmacy Consult for Heparin Indication: post embolization of aneurysm  Allergies  Allergen Reactions  . Statins     Muscle aches- states is doing fine with Atorvastatin    Patient Measurements: Weight: 142 lb (64.411 kg)  Vital Signs: Temp: 98 F (36.7 C) (06/01 1415) Temp Source: Oral (06/01 0653) BP: 113/77 mmHg (06/01 1415) Pulse Rate: 61 (06/01 1415)  Labs: No results for input(s): HGB, HCT, PLT, APTT, LABPROT, INR, HEPARINUNFRC, CREATININE, CKTOTAL, CKMB, TROPONINI in the last 72 hours.  Estimated Creatinine Clearance: 65.1 mL/min (by C-G formula based on Cr of 0.82).   Medical History: Past Medical History  Diagnosis Date  . Bursitis of left hip 8-13    takes Meloxicam daily   . Hiatal hernia 209  . Hyperlipidemia     takes Atorvastatin daily  . Aneurysm     cerebral  . Insomnia     takes Xanax nightly  . GERD (gastroesophageal reflux disease)     takes Omeprazole daily  . Depression     takes Lexapro daily  . History of bronchitis 2015  . Headache   . Weakness     numbness and tingling-thinks its carpal tunnel  . Back pain   . History of colon polyps     benign  . History of kidney stones   . History of shingles    Assessment: 58yof s/p 4 vessel cerebral arteriogram with stent placement was started on heparin at 500 units/hr in the PACU. Pharmacy asked to follow. No labs since 5/26. Labs at that time were wnl except platelets low at 128  Goal of Therapy:  Heparin level 0.1-0.25 units/ml Monitor platelets by anticoagulation protocol: Yes   Plan:  1) Continue heparin at 500 units/hr 2) Check 6 hour heparin level  Deboraha Sprang 05/11/2015,2:41 PM   Addendum: Heparin level is within goal on 500 units/hr.  No change needed.  Anticipate heparin to stop in AM.  Will follow.  [employer], Pharm.D., BCPS Clinical Pharmacist Pager [phone] 05/11/2015 9:42 PM

## 2015-05-11 NOTE — Anesthesia Postprocedure Evaluation (Signed)
Anesthesia Post Note  Patient: Katrina Woods  Procedure(s) Performed: Procedure(s) (LRB): ANEURYSM EMBOLIZATION (N/A)  Anesthesia type: General  Patient location: PACU  Post pain: Pain level controlled and Adequate analgesia  Post assessment: Post-op Vital signs reviewed, Patient's Cardiovascular Status Stable, Respiratory Function Stable, Patent Airway and Pain level controlled  Last Vitals:  Filed Vitals:   05/11/15 1415  BP: 113/77  Pulse: 61  Temp: 36.7 C  Resp: 9    Post vital signs: Reviewed and stable  Level of consciousness: awake, alert  and oriented  Complications: No apparent anesthesia complications

## 2015-05-11 NOTE — H&P (Signed)
Chief Complaint: headaches  Referring Physician(s): Deveshwar,Sanjeev  History of Present Illness: Katrina Woods is a 59 y.o. female   Previously coiled basilar tip aneurysm 2006 Has done well and followed with angiograms and MRA Most recent MRA 02/2015 was performed after sudden onset headache Left face/head after stooping to pick something up from floor. Revealed what appears to be small recanalization at neck of basilar tip aneurysm. Now scheduled for cerebral arteriogram with possible additional coiling/or stent placement   Past Medical History  Diagnosis Date  . Bursitis of left hip 8-13    takes Meloxicam daily   . Hiatal hernia 209  . Hyperlipidemia     takes Atorvastatin daily  . Aneurysm     cerebral  . Insomnia     takes Xanax nightly  . GERD (gastroesophageal reflux disease)     takes Omeprazole daily  . Depression     takes Lexapro daily  . History of bronchitis 2015  . Headache   . Weakness     numbness and tingling-thinks its carpal tunnel  . Back pain   . History of colon polyps     benign  . History of kidney stones   . History of shingles     Past Surgical History  Procedure Laterality Date  . Abdominal hysterectomy  2004    total  . Knee cartilage surgery Left 1987  . Cesarean section  11-25-82  . Wisdom tooth extraction  1986    all four extracted  . Cerebral aneurysm repair  2006    Deveshwar, coil   . Colonoscopy    . Esophagogastroduodenoscopy      Allergies: Statins  Medications: Prior to Admission medications   Medication Sig Start Date End Date Taking? Authorizing Provider  acetaminophen (TYLENOL) 500 MG tablet Take 500 mg by mouth every 6 (six) hours as needed for headache (pain).   Yes Historical Provider, MD  ALPRAZolam Duanne Moron) 1 MG tablet TAKE 1 TABLET BY MOUTH AT BEDTIME for insomnia 03/07/15  Yes Crecencio Mc, MD  atorvastatin (LIPITOR) 20 MG tablet Take 20 mg by mouth daily.   Yes Historical Provider, MD    clopidogrel (PLAVIX) 75 MG tablet Take 75 mg by mouth daily.   Yes Historical Provider, MD  escitalopram (LEXAPRO) 5 MG tablet Take 1 tablet (5 mg total) by mouth daily. 02/14/15  Yes Crecencio Mc, MD  meloxicam (MOBIC) 15 MG tablet Take 1 tablet (15 mg total) by mouth daily. 02/14/15  Yes Crecencio Mc, MD  omeprazole (PRILOSEC) 40 MG capsule TAKE 1 CAPSULE (40 MG TOTAL) BY MOUTH DAILY. 08/02/14  Yes Crecencio Mc, MD  co-enzyme Q-10 30 MG capsule Take 1 capsule (30 mg total) by mouth daily. Patient not taking: Reported on 05/02/2015 10/12/13   Crecencio Mc, MD     Family History  Problem Relation Age of Onset  . Diabetes Mother     type 2  . Hypertension Mother   . Heart disease Mother     CHF  . Cancer Mother     lung- previous smoker- had quit 12 yrs before  . Appendicitis Father     acute  . Cancer Father     prostate?  . Diabetes Brother 58    type 2  . Hypertension Brother   . Cancer Maternal Grandmother     breast, uterine  . Hypertension Maternal Grandmother   . Heart disease Maternal Grandmother     CHF  .  Diabetes Maternal Grandmother   . Stroke Maternal Grandfather   . Heart block Paternal Grandfather     massive  . Colon cancer Neg Hx   . Stomach cancer Neg Hx     History   Social History  . Marital Status: Single    Spouse Name: N/A  . Number of Children: N/A  . Years of Education: N/A   Social History Main Topics  . Smoking status: Former Smoker    Types: Cigarettes  . Smokeless tobacco: Never Used     Comment: quit smoking in Nov 2015  . Alcohol Use: 0.0 oz/week    0 Standard drinks or equivalent per week     Comment: occasional wine  . Drug Use: No  . Sexual Activity: Not Currently   Other Topics Concern  . None   Social History Narrative     Review of Systems: A 12 point ROS discussed and pertinent positives are indicated in the HPI above.  All other systems are negative.  Review of Systems  Constitutional: Negative for fever,  activity change and fatigue.  HENT: Negative for hearing loss, tinnitus, trouble swallowing and voice change.   Respiratory: Negative for shortness of breath.   Cardiovascular: Negative for chest pain.  Gastrointestinal: Negative for abdominal pain.  Musculoskeletal: Negative for back pain.  Neurological: Positive for headaches. Negative for dizziness, tremors, seizures, syncope, facial asymmetry, speech difficulty, weakness, light-headedness and numbness.  Psychiatric/Behavioral: Negative for behavioral problems and confusion.    Vital Signs: There were no vitals taken for this visit.  Physical Exam  Constitutional: She is oriented to person, place, and time. She appears well-nourished.  Eyes: EOM are normal.  Neck: Normal range of motion.  Cardiovascular: Normal rate, regular rhythm and normal heart sounds.   No murmur heard. Pulmonary/Chest: Effort normal and breath sounds normal. She has no wheezes.  Abdominal: Soft. Bowel sounds are normal. There is no tenderness.  Musculoskeletal: Normal range of motion.  Neurological: She is alert and oriented to person, place, and time.  Skin: Skin is warm and dry.  Psychiatric: She has a normal mood and affect. Her behavior is normal. Judgment and thought content normal.  Vitals reviewed.   Mallampati Score:     Imaging: No results found.  Labs:  CBC:  Recent Labs  07/21/14 0832 05/05/15 1031  WBC 8.4 5.4  HGB 14.4 13.5  HCT 42.4 40.5  PLT 234.0 128*    COAGS:  Recent Labs  05/05/15 1031  INR 0.97  APTT 29    BMP:  Recent Labs  07/21/14 0832 05/05/15 1031  NA 139 138  K 4.5 4.4  CL 105 106  CO2 25 24  GLUCOSE 88 98  BUN 16 14  CALCIUM 9.6 9.8  CREATININE 1.0 0.82  GFRNONAA  --  >60  GFRAA  --  >60    LIVER FUNCTION TESTS:  Recent Labs  07/21/14 0832 05/05/15 1031  BILITOT 0.5 0.6  AST 18 21  ALT 11 12*  ALKPHOS 54 49  PROT 6.9 6.4*  ALBUMIN 3.8 3.9    TUMOR MARKERS: No results for  input(s): AFPTM, CEA, CA199, CHROMGRNA in the last 8760 hours.  Assessment and Plan:  Basilar tip aneurysm coiling 2006 Now with apparent recanalization at neck of aneurysm Scheduled for cerebral arteriogram with possible additional coiling/ or stent placement Risks and Benefits discussed with the patient including, but not limited to bleeding, infection, vascular injury, contrast induced renal failure, stroke or even death. All of  the patient's questions were answered, patient is agreeable to proceed. Consent signed and in chart. Pt aware of probable admission to Neuro ICU overnight Plan for dc in am  Thank you for this interesting consult.  I greatly enjoyed meeting Katrina Woods and look forward to participating in their care.  Signed: Art Levan A 05/11/2015, 8:07 AM   I spent a total of  20 Minutes   in face to face in clinical consultation, greater than 50% of which was counseling/coordinating care for cerebral arteriogram with possible embolization

## 2015-05-11 NOTE — Anesthesia Procedure Notes (Addendum)
Procedure Name: MAC Date/Time: 05/11/2015 9:30 AM Performed by: Neldon Newport Pre-anesthesia Checklist: Timeout performed, Patient being monitored, Suction available, Emergency Drugs available and Patient identified Patient Re-evaluated:Patient Re-evaluated prior to inductionOxygen Delivery Method: Nasal cannula Placement Confirmation: positive ETCO2   Procedure Name: Intubation Date/Time: 05/11/2015 10:31 AM Performed by: Neldon Newport Pre-anesthesia Checklist: Timeout performed, Patient being monitored, Suction available, Emergency Drugs available and Patient identified Patient Re-evaluated:Patient Re-evaluated prior to inductionOxygen Delivery Method: Circle system utilized Preoxygenation: Pre-oxygenation with 100% oxygen Intubation Type: IV induction Ventilation: Mask ventilation without difficulty Laryngoscope Size: Mac and 3 Grade View: Grade I Tube type: Oral Tube size: 7.0 mm Number of attempts: 1 Placement Confirmation: breath sounds checked- equal and bilateral,  positive ETCO2 and ETT inserted through vocal cords under direct vision Secured at: 22 cm Tube secured with: Tape Dental Injury: Teeth and Oropharynx as per pre-operative assessment

## 2015-05-11 NOTE — Progress Notes (Signed)
Nimodipine not given as patient's BP and HR did not meet medication parameters.  Jannifer Franklin, PA in room at time and notified.

## 2015-05-11 NOTE — Anesthesia Preprocedure Evaluation (Addendum)
Anesthesia Evaluation  Patient identified by MRN, date of birth, ID band Patient awake    Reviewed: Allergy & Precautions, H&P , NPO status , Patient's Chart, lab work & pertinent test results  Airway Mallampati: II  TM Distance: >3 FB Neck ROM: full    Dental  (+) Teeth Intact, Dental Advidsory Given   Pulmonary former smoker,  breath sounds clear to auscultation        Cardiovascular + Peripheral Vascular Disease Rhythm:regular Rate:Normal     Neuro/Psych  Headaches, PSYCHIATRIC DISORDERS Depression Cerebral aneurysm.  Neuromuscular disease    GI/Hepatic hiatal hernia, GERD-  Medicated and Controlled,  Endo/Other    Renal/GU      Musculoskeletal   Abdominal   Peds  Hematology   Anesthesia Other Findings   Reproductive/Obstetrics                            Anesthesia Physical Anesthesia Plan  ASA: II  Anesthesia Plan: General   Post-op Pain Management:    Induction: Intravenous  Airway Management Planned: Oral ETT  Additional Equipment: Arterial line  Intra-op Plan:   Post-operative Plan: Extubation in OR  Informed Consent: I have reviewed the patients History and Physical, chart, labs and discussed the procedure including the risks, benefits and alternatives for the proposed anesthesia with the patient or authorized representative who has indicated his/her understanding and acceptance.   Dental Advisory Given  Plan Discussed with: CRNA, Anesthesiologist and Surgeon  Anesthesia Plan Comments:        Anesthesia Quick Evaluation

## 2015-05-11 NOTE — Transfer of Care (Signed)
Immediate Anesthesia Transfer of Care Note  Patient: Katrina Woods  Procedure(s) Performed: Procedure(s): ANEURYSM EMBOLIZATION (N/A)  Patient Location: PACU  Anesthesia Type:General  Level of Consciousness: awake, alert  and oriented  Airway & Oxygen Therapy: Patient Spontanous Breathing and Patient connected to nasal cannula oxygen  Post-op Assessment: Report given to RN, Post -op Vital signs reviewed and stable and Patient moving all extremities X 4  Post vital signs: Reviewed and stable  Last Vitals:  Filed Vitals:   05/11/15 0653  BP: 117/56  Pulse: 58  Temp: 36.3 C  Resp: 20    Complications: No apparent anesthesia complications

## 2015-05-11 NOTE — Procedures (Signed)
S/P 4 vessel cerebral arteriogram followed by placement of LVIS JR stenr across wide neck new basilar apex aneurysm,ant to previously coiled basilar apex aneurysm

## 2015-05-12 ENCOUNTER — Encounter (HOSPITAL_COMMUNITY): Payer: Self-pay | Admitting: Interventional Radiology

## 2015-05-12 LAB — BASIC METABOLIC PANEL
ANION GAP: 6 (ref 5–15)
BUN: 6 mg/dL (ref 6–20)
CO2: 24 mmol/L (ref 22–32)
Calcium: 8.3 mg/dL — ABNORMAL LOW (ref 8.9–10.3)
Chloride: 110 mmol/L (ref 101–111)
Creatinine, Ser: 0.76 mg/dL (ref 0.44–1.00)
GFR calc Af Amer: >60 mL/min (ref >60)
GFR calc non Af Amer: >60 mL/min (ref >60)
GLUCOSE: 95 mg/dL (ref 65–99)
POTASSIUM: 4.2 mmol/L (ref 3.5–5.1)
SODIUM: 140 mmol/L (ref 135–145)

## 2015-05-12 LAB — CBC WITH DIFFERENTIAL/PLATELET
Basophils Absolute: 0 10*3/uL (ref 0.0–0.1)
Basophils Relative: 1 % (ref 0–1)
Eosinophils Absolute: 0.1 10*3/uL (ref 0.0–0.7)
Eosinophils Relative: 2 % (ref 0–5)
HCT: 32 % — ABNORMAL LOW (ref 36.0–46.0)
Hemoglobin: 10.7 g/dL — ABNORMAL LOW (ref 12.0–15.0)
Lymphocytes Relative: 37 % (ref 12–46)
Lymphs Abs: 2.4 10*3/uL (ref 0.7–4.0)
MCH: 30.8 pg (ref 26.0–34.0)
MCHC: 33.4 g/dL (ref 30.0–36.0)
MCV: 92.2 fL (ref 78.0–100.0)
Monocytes Absolute: 0.5 10*3/uL (ref 0.1–1.0)
Monocytes Relative: 8 % (ref 3–12)
Neutro Abs: 3.4 10*3/uL (ref 1.7–7.7)
Neutrophils Relative %: 52 % (ref 43–77)
PLATELETS: 125 10*3/uL — AB (ref 150–400)
RBC: 3.47 MIL/uL — ABNORMAL LOW (ref 3.87–5.11)
RDW: 13.8 % (ref 11.5–15.5)
WBC: 6.5 10*3/uL (ref 4.0–10.5)

## 2015-05-12 NOTE — Progress Notes (Signed)
Pt ready for dc to home. Went over dc instructions, pt voided and walked the unit with no problems. Pt will dc via wheelchair by volunteer home with husband

## 2015-05-12 NOTE — Discharge Summary (Signed)
Patient ID: Katrina [name] MRN: [id number] DOB/AGE: 60/05/1956 60 y.o.  Admit date: 05/11/2015 Discharge date: 05/12/2015  Admission Diagnoses: Basilar artery aneurysm  Discharge Diagnoses: treatment of cerebral aneurysm   Active Problems:   Brain aneurysm  Hyperlipidemia; GERD; migraine headache  Discharged Condition: improved  Hospital Course: Pt with known basilar artery aneurysm coiled 2006. Has done well and followed with angiogram and MRI with Dr Estanislado Pandy. New onset worsening headaches in last few months. Most recent MRA 02/2015 revealed what appeared to be neck remnant of same aneurysm. 05/11/2015 arteriogram shows new, second basilar artery aneurysm. LVIS Jr stent was placed and endovascular embolization was achieved. Overnight stay in Neuro ICU pt developed migraine like headache---chronic issue. Tylenol and Vicodin relieves pain. Denies vision issues or N/V. Slept well. No other complaints Good UOP. VVS Dr Estanislado Pandy has seen and examined pt Plan for discharge after ambulation and urination on own Follow up in 2 weeks   Consults: None  Significant Diagnostic Studies: Cerebral arteriogram  Treatments: LVIS Jr stent placement over neck of basilar artery tip aneurysm  Discharge Exam: Blood pressure 123/72, pulse 50, temperature 97.8 F (36.6 C), temperature source Oral, resp. rate 10, height 5\' 2"  (1.575 m), weight 67.2 kg (148 lb 2.4 oz), SpO2 96 %.  Physical Exam  Constitutional: Katrina [name] is oriented to person, place, and time. Katrina [name] appears well-nourished.  HENT:  Face symmetrical Puffs cheek = Smile = pleasant  Eyes: EOM are normal.  Neck: Normal range of motion.  Cardiovascular: Normal rate, regular rhythm and normal heart sounds.  No murmur heard. Pulmonary/Chest: Effort normal and breath sounds normal. Katrina [name] has no wheezes.  Abdominal: Soft. Bowel sounds are normal.  Rt groin NT; no bleeding; no hematoma soft  Musculoskeletal: Normal range of  motion. Katrina [name] exhibits no tenderness.  Rt foot 2+ pulses  Neurological: Katrina [name] is alert and oriented to person, place, and time.  Skin: Skin is warm and dry.  Psychiatric: Katrina [name] has a normal mood and affect. Her behavior is normal. Judgment and thought content normal.  Nursing note and vitals reviewed.  Results for orders placed or performed during the hospital encounter of 05/11/15  MRSA PCR Screening  Result Value Ref Range   MRSA by PCR NEGATIVE NEGATIVE  Platelet inhibition p2y12  Result Value Ref Range   Platelet Function  P2Y12 71 (L) 194 - 418 PRU  Heparin level (unfractionated)  Result Value Ref Range   Heparin Unfractionated 0.18 (L) 0.30 - 0.70 IU/mL  Basic metabolic panel  Result Value Ref Range   Sodium 140 135 - 145 mmol/L   Potassium 4.2 3.5 - 5.1 mmol/L   Chloride 110 101 - 111 mmol/L   CO2 24 22 - 32 mmol/L   Glucose, Bld 95 65 - 99 mg/dL   BUN 6 6 - 20 mg/dL   Creatinine, Ser 0.76 0.44 - 1.00 mg/dL   Calcium 8.3 (L) 8.9 - 10.3 mg/dL   GFR calc non Af Amer >60 >60 mL/min   GFR calc Af Amer >60 >60 mL/min   Anion gap 6 5 - 15  CBC WITH DIFFERENTIAL  Result Value Ref Range   WBC 6.5 4.0 - 10.5 K/uL   RBC 3.47 (L) 3.87 - 5.11 MIL/uL   Hemoglobin 10.7 (L) 12.0 - 15.0 g/dL   HCT 32.0 (L) 36.0 - 46.0 %   MCV 92.2 78.0 - 100.0 fL   MCH 30.8 26.0 - 34.0 pg   MCHC 33.4 30.0 - 36.0 g/dL  RDW 13.8 11.5 - 15.5 %   Platelets 125 (L) 150 - 400 K/uL   Neutrophils Relative % 52 43 - 77 %   Neutro Abs 3.4 1.7 - 7.7 K/uL   Lymphocytes Relative 37 12 - 46 %   Lymphs Abs 2.4 0.7 - 4.0 K/uL   Monocytes Relative 8 3 - 12 %   Monocytes Absolute 0.5 0.1 - 1.0 K/uL   Eosinophils Relative 2 0 - 5 %   Eosinophils Absolute 0.1 0.0 - 0.7 K/uL   Basophils Relative 1 0 - 1 %   Basophils Absolute 0.0 0.0 - 0.1 K/uL    Disposition:  Basilar artery aneurysm embolization with LVIS Jr stent placement Dr Estanislado Pandy has seen and examined pt. DC to home. Continue all home meds Cont ASA 325  and Plavix daily Follow up 2 weeks with Dr Estanislado Pandy Call [phone] if needs Pt leaves here with good understanding of plan.  Discharge Instructions    Call MD for:  difficulty breathing, headache or visual disturbances    Complete by:  As directed      Call MD for:  extreme fatigue    Complete by:  As directed      Call MD for:  hives    Complete by:  As directed      Call MD for:  persistant dizziness or light-headedness    Complete by:  As directed      Call MD for:  persistant nausea and vomiting    Complete by:  As directed      Call MD for:  redness, tenderness, or signs of infection (pain, swelling, redness, odor or green/yellow discharge around incision site)    Complete by:  As directed      Call MD for:  severe uncontrolled pain    Complete by:  As directed      Call MD for:  temperature >100.4    Complete by:  As directed      Diet - low sodium heart healthy    Complete by:  As directed      Discharge instructions    Complete by:  As directed   2 week follow up with Dr Gennaro Africa will call pt with time and date; continue all home meds; call [phone] if questions or concerns     Discharge wound care:    Complete by:  As directed   May shower today; new band aid to groin site daily x 1 week     Driving Restrictions    Complete by:  As directed   No driving x 2 weeks     Increase activity slowly    Complete by:  As directed      Lifting restrictions    Complete by:  As directed   No lifting over 10 lbs x 2 weeks            Medication List    TAKE these medications        acetaminophen 500 MG tablet  Commonly known as:  TYLENOL  Take 500 mg by mouth every 6 (six) hours as needed for headache (pain).     ALPRAZolam 1 MG tablet  Commonly known as:  XANAX  TAKE 1 TABLET BY MOUTH AT BEDTIME for insomnia     atorvastatin 20 MG tablet  Commonly known as:  LIPITOR  Take 20 mg by mouth daily.     clopidogrel 75 MG tablet  Commonly known as:   PLAVIX  Take  75 mg by mouth daily.     co-enzyme Q-10 30 MG capsule  Take 1 capsule (30 mg total) by mouth daily.     escitalopram 5 MG tablet  Commonly known as:  LEXAPRO  Take 1 tablet (5 mg total) by mouth daily.     meloxicam 15 MG tablet  Commonly known as:  MOBIC  Take 1 tablet (15 mg total) by mouth daily.     omeprazole 40 MG capsule  Commonly known as:  PRILOSEC  TAKE 1 CAPSULE (40 MG TOTAL) BY MOUTH DAILY.         I have spent (less than 30 minutes coordinating discharge for [employer].    Signed: Lavonia Drafts [facility] 05/12/2015, 10:05 AM

## 2015-05-12 NOTE — Progress Notes (Signed)
Referring Physician(s): Dr Humphrey Rolls  Subjective:  LVIS Katrina [name] 6/1 Pt did well overnight Still only complaint is headache---pt feels same as chronic migraine headache Vicodin does help---just wears off 4-5 hrs Denies vision issue; N/V Has eaten broth and cracker with ease Will report to Dr Estanislado Pandy Plan for probable dc today Has has ASA/Plavix this am  Allergies: Statins  Medications: Prior to Admission medications   Medication Sig Start Date End Date Taking? Authorizing Provider  acetaminophen (TYLENOL) 500 MG tablet Take 500 mg by mouth every 6 (six) hours as needed for headache (pain).   Yes Historical Provider, MD  ALPRAZolam Duanne Moron) 1 MG tablet TAKE 1 TABLET BY MOUTH AT BEDTIME for insomnia 03/07/15  Yes Crecencio Mc, MD  atorvastatin (LIPITOR) 20 MG tablet Take 20 mg by mouth daily.   Yes Historical Provider, MD  clopidogrel (PLAVIX) 75 MG tablet Take 75 mg by mouth daily.   Yes Historical Provider, MD  escitalopram (LEXAPRO) 5 MG tablet Take 1 tablet (5 mg total) by mouth daily. 02/14/15  Yes Crecencio Mc, MD  meloxicam (MOBIC) 15 MG tablet Take 1 tablet (15 mg total) by mouth daily. 02/14/15  Yes Crecencio Mc, MD  omeprazole (PRILOSEC) 40 MG capsule TAKE 1 CAPSULE (40 MG TOTAL) BY MOUTH DAILY. 08/02/14  Yes Crecencio Mc, MD  co-enzyme Q-10 30 MG capsule Take 1 capsule (30 mg total) by mouth daily. Patient not taking: Reported on 05/02/2015 10/12/13   Crecencio Mc, MD     Vital Signs: BP 106/63 mmHg  Pulse 61  Temp(Src) 97.8 F (36.6 C) (Oral)  Resp 9  Ht 5\' 2"  (1.575 m)  Wt 67.2 kg (148 lb 2.4 oz)  BMI 27.09 kg/m2  SpO2 93%  Physical Exam  Constitutional: She is oriented to person, place, and time. She appears well-nourished.  HENT:  Face symmetrical Puffs cheek = Smile = pleasant  Eyes: EOM are normal.  Neck: Normal range of motion.  Cardiovascular: Normal rate, regular rhythm and normal heart  sounds.   No murmur heard. Pulmonary/Chest: Effort normal and breath sounds normal. She has no wheezes.  Abdominal: Soft. Bowel sounds are normal.  Rt groin NT; no bleeding; no hematoma soft  Musculoskeletal: Normal range of motion. She exhibits no tenderness.  Rt foot 2+ pulses  Neurological: She is alert and oriented to person, place, and time.  Skin: Skin is warm and dry.  Psychiatric: She has a normal mood and affect. Her behavior is normal. Judgment and thought content normal.  Nursing note and vitals reviewed.   Imaging: No results found.  Labs:  CBC:  Recent Labs  07/21/14 0832 05/05/15 1031 05/12/15 0530  WBC 8.4 5.4 6.5  HGB 14.4 13.5 10.7*  HCT 42.4 40.5 32.0*  PLT 234.0 128* 125*    COAGS:  Recent Labs  05/05/15 1031  INR 0.97  APTT 29    BMP:  Recent Labs  07/21/14 0832 05/05/15 1031 05/12/15 0530  NA 139 138 140  K 4.5 4.4 4.2  CL 105 106 110  CO2 25 24 24   GLUCOSE 88 98 95  BUN 16 14 6   CALCIUM 9.6 9.8 8.3*  CREATININE 1.0 0.82 0.76  GFRNONAA  --  >60 >60  GFRAA  --  >60 >60    LIVER FUNCTION TESTS:  Recent Labs  07/21/14 0832 05/05/15 1031  BILITOT 0.5 0.6  AST 18 21  ALT 11 12*  ALKPHOS 54 49  PROT 6.9 6.4*  ALBUMIN 3.8 3.9    Assessment and Plan:  LVIS stent over neck new basilar tip [name] 6/1 Doing well except for chronic migraine pain Relieved with Vicodin Dr Estanislado Pandy to see pt this am--plan for prob dc today Dc foley; dc art line Increase diet  Signed: [name] 05/12/2015, 8:20 AM   I spent a total of 15 Minutes in face to face in clinical consultation/evaluation, greater than 50% of which was counseling/coordinating care for basilar tip [name] LVIS stent

## 2015-05-31 ENCOUNTER — Ambulatory Visit (HOSPITAL_COMMUNITY)
Admission: RE | Admit: 2015-05-31 | Discharge: 2015-05-31 | Disposition: A | Discharge status: Home / Self Care | Payer: BLUE CROSS/BLUE SHIELD | Source: Ambulatory Visit | Attending: Radiology | Admitting: Radiology

## 2015-05-31 ENCOUNTER — Other Ambulatory Visit: Payer: Self-pay | Admitting: Radiology

## 2015-05-31 DIAGNOSIS — Z48812 Encounter for surgical aftercare following surgery on the circulatory system: Secondary | ICD-10-CM | POA: Diagnosis not present

## 2015-05-31 DIAGNOSIS — I671 Cerebral aneurysm, nonruptured: Secondary | ICD-10-CM | POA: Diagnosis not present

## 2015-05-31 DIAGNOSIS — Z7982 Long term (current) use of aspirin: Secondary | ICD-10-CM | POA: Insufficient documentation

## 2015-05-31 DIAGNOSIS — G43909 Migraine, unspecified, not intractable, without status migrainosus: Secondary | ICD-10-CM | POA: Diagnosis not present

## 2015-05-31 DIAGNOSIS — I729 Aneurysm of unspecified site: Secondary | ICD-10-CM

## 2015-05-31 DIAGNOSIS — Z7902 Long term (current) use of antithrombotics/antiplatelets: Secondary | ICD-10-CM | POA: Diagnosis not present

## 2015-05-31 LAB — PLATELET INHIBITION P2Y12: Platelet Function  P2Y12: 1 [PRU] — ABNORMAL LOW (ref 194–418)

## 2015-06-02 ENCOUNTER — Telehealth (HOSPITAL_COMMUNITY): Payer: Self-pay | Admitting: Interventional Radiology

## 2015-06-02 NOTE — Telephone Encounter (Signed)
Called pt and told her per Deveshwar to change her Plavix from 75mg  1 tablet daily to 75mg  1/2 tablet daily. He also instructed her to change her Aspirin 325mg  1 tablet daily to Aspirin 81mg  daily and to come back in 2 weeks to have her P2Y12 rechecked. Pt states understanding and is in agreement w/ this plan of care. JM

## 2015-06-03 ENCOUNTER — Telehealth (HOSPITAL_COMMUNITY): Payer: Self-pay | Admitting: Interventional Radiology

## 2015-06-03 NOTE — Telephone Encounter (Signed)
Called pt to let her know that I had faxed in the requested information to [facility] for her short-term disability continuation. JM

## 2015-06-05 ENCOUNTER — Other Ambulatory Visit: Payer: Self-pay | Admitting: Internal Medicine

## 2015-06-07 ENCOUNTER — Other Ambulatory Visit: Payer: Self-pay | Admitting: Neurology

## 2015-06-07 DIAGNOSIS — G44209 Tension-type headache, unspecified, not intractable: Secondary | ICD-10-CM

## 2015-06-09 NOTE — Sedation Documentation (Signed)
All necessary paperwork has been forward to the patient's [place] Scientist, clinical (histocompatibility and immunogenetics)) Velora Mediate

## 2015-06-17 ENCOUNTER — Other Ambulatory Visit: Payer: BLUE CROSS/BLUE SHIELD

## 2015-06-17 ENCOUNTER — Ambulatory Visit
Admission: RE | Admit: 2015-06-17 | Discharge: 2015-06-17 | Disposition: A | Discharge status: Home / Self Care | Payer: BLUE CROSS/BLUE SHIELD | Source: Ambulatory Visit | Attending: Neurology | Admitting: Neurology

## 2015-06-17 DIAGNOSIS — G44209 Tension-type headache, unspecified, not intractable: Secondary | ICD-10-CM

## 2015-06-17 MED ORDER — IOPAMIDOL (ISOVUE-370) INJECTION 76%
75.0000 mL | Freq: Once | INTRAVENOUS | Status: AC | PRN
  Administered 2015-06-17: 75 mL via INTRAVENOUS

## 2015-07-10 NOTE — Telephone Encounter (Signed)
  I have reviewed the above information and agree with above.   Teresa Tullo, MD 

## 2015-07-13 ENCOUNTER — Other Ambulatory Visit: Payer: Self-pay | Admitting: Radiology

## 2015-07-13 LAB — PLATELET INHIBITION P2Y12: Platelet Function  P2Y12: 202 [PRU] (ref 194–418)

## 2015-07-28 ENCOUNTER — Telehealth (HOSPITAL_COMMUNITY): Payer: Self-pay | Admitting: *Deleted

## 2015-07-28 NOTE — Telephone Encounter (Signed)
Called results of P2Y12 to pt.  Instructed for patient to take 37.5mg  of plavix daily and to continue her asprin regimen, this was per Dr. Estanislado Pandy instructions. Instructed patient to come back in in 2 weeks for repeat P2Y12

## 2015-08-17 ENCOUNTER — Other Ambulatory Visit: Payer: Self-pay | Admitting: Internal Medicine

## 2015-10-03 ENCOUNTER — Ambulatory Visit (INDEPENDENT_AMBULATORY_CARE_PROVIDER_SITE_OTHER): Payer: BLUE CROSS/BLUE SHIELD | Admitting: Internal Medicine

## 2015-10-03 ENCOUNTER — Encounter: Payer: Self-pay | Admitting: Internal Medicine

## 2015-10-03 VITALS — BP 132/78 | HR 58 | Temp 98.3°F | Resp 12 | Ht 62.25 in | Wt 138.4 lb

## 2015-10-03 DIAGNOSIS — Z79899 Other long term (current) drug therapy: Secondary | ICD-10-CM | POA: Diagnosis not present

## 2015-10-03 DIAGNOSIS — E785 Hyperlipidemia, unspecified: Secondary | ICD-10-CM

## 2015-10-03 DIAGNOSIS — Z23 Encounter for immunization: Secondary | ICD-10-CM

## 2015-10-03 DIAGNOSIS — Z1239 Encounter for other screening for malignant neoplasm of breast: Secondary | ICD-10-CM | POA: Diagnosis not present

## 2015-10-03 DIAGNOSIS — Z716 Tobacco abuse counseling: Secondary | ICD-10-CM

## 2015-10-03 DIAGNOSIS — Z0000 Encounter for general adult medical examination without abnormal findings: Secondary | ICD-10-CM

## 2015-10-03 MED ORDER — VARENICLINE TARTRATE 1 MG PO TABS: 1.0000 mg | ORAL_TABLET | Freq: Two times a day (BID) | ORAL | Status: DC

## 2015-10-03 MED ORDER — ALPRAZOLAM 1 MG PO TABS
ORAL_TABLET | ORAL | Status: DC
Start: 2015-10-03 — End: 2016-10-15

## 2015-10-03 NOTE — Progress Notes (Addendum)
Patient ID: Katrina Woods, female    DOB: April 03, 1956  Age: 59 y.o. MRN: [id number]  The patient is here for annualwellness examination and management of other chronic and acute problems.    TAH mammo diagnostic 2014 [facility] none since  Tubular adenoma 2013. 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   Going back to Yoga  About 5 days per week  which helps .   Has lost weight  Also using Fit bit to guide walking trips Smoking again intermittently 'taking lipitor 3 days per week  fue to staitn intolerance   The risk factors are reflected in the social history.  The roster of all physicians providing medical care to patient - is listed in the Snapshot section of the chart.   Home safety : The patient has smoke detectors in the home. They wear seatbelts.  There are no firearms at home. There is no violence in the home.   There is no risks for hepatitis, STDs or HIV. There is no   history of blood transfusion. They have no travel history to infectious disease endemic areas of the world.  The patient has seen their dentist in the last six month. They have seen their eye doctor in the last year. They admit to slight hearing difficulty with regard to whispered voices and some television programs.  They have deferred audiologic testing in the last year.  They do not  have excessive sun exposure. Discussed the need for sun protection: hats, long sleeves and use of sunscreen if there is significant sun exposure.   Diet: the importance of a healthy diet is discussed. They do have a healthy diet.  The benefits of regular aerobic exercise were discussed. She walks 4 times per week ,  20 minutes.   Depression screen: there are no signs or vegative symptoms of depression- irritability, change in appetite, anhedonia, sadness/tearfullness.   The following portions of the  patient's history were reviewed and updated as appropriate: allergies, current medications, past family history, past medical history,  past surgical history, past social history  and problem list.  Visual acuity was not assessed per patient preference since she has regular follow up with her ophthalmologist. Hearing and body mass index were assessed and reviewed.   During the course of the visit the patient was educated and counseled about appropriate screening and preventive services including : fall prevention , diabetes screening, nutrition counseling, colorectal cancer screening, and recommended immunizations.    CC: The primary encounter diagnosis was Breast cancer screening. Diagnoses of Hyperlipidemia, Long-term use of high-risk medication, Encounter for immunization, Encounter for preventive health examination, and Tobacco abuse counseling were also pertinent to this visit.  History Leanny has a past medical history of Bursitis of left hip (8-13); Hiatal hernia (209); Hyperlipidemia; Aneurysm ([place]); Insomnia; GERD (gastroesophageal reflux disease); Depression; History of bronchitis (2015); Headache; Weakness; Back pain; History of colon polyps; History of kidney stones; and History of shingles.   She has past surgical history that includes Abdominal hysterectomy (2004); Knee cartilage surgery (Left, 1987); Cesarean section (11-25-82); Wisdom tooth extraction (1986); Cerebral aneurysm repair (2006); Colonoscopy; Esophagogastroduodenoscopy; and Radiology with anesthesia (N/A, 05/11/2015).   Her family history includes Appendicitis in her father; Cancer in her father, maternal grandmother, and mother; Diabetes in her maternal grandmother and mother; Diabetes (age of onset: 3) in her brother; Heart  block in her paternal grandfather; Heart disease in her maternal grandmother and mother; Hypertension in her brother, maternal grandmother, and mother; Stroke in her maternal grandfather. There is no  history of Colon cancer or Stomach cancer.She reports that she has quit smoking. Her smoking use included Cigarettes. She has never used smokeless tobacco. She reports that she drinks alcohol. She reports that she does not use illicit drugs.  Outpatient Prescriptions Prior to Visit  Medication Sig Dispense Refill  . clopidogrel (PLAVIX) 75 MG tablet Take 75 mg by mouth daily.    Marland Kitchen escitalopram (LEXAPRO) 5 MG tablet Take 1 tablet (5 mg total) by mouth daily. 90 tablet 1  . meloxicam (MOBIC) 15 MG tablet Take 1 tablet (15 mg total) by mouth daily. 90 tablet 1  . omeprazole (PRILOSEC) 40 MG capsule TAKE 1 CAPSULE (40 MG TOTAL) BY MOUTH DAILY. 30 capsule 10  . ALPRAZolam (XANAX) 1 MG tablet TAKE 1 TABLET BY MOUTH AT BEDTIME for insomnia 30 tablet 5  . atorvastatin (LIPITOR) 20 MG tablet Take 20 mg by mouth daily.    Marland Kitchen atorvastatin (LIPITOR) 20 MG tablet TAKE 1 TABLET (20 MG TOTAL) BY MOUTH DAILY. 90 tablet 1  . acetaminophen (TYLENOL) 500 MG tablet Take 500 mg by mouth every 6 (six) hours as needed for headache (pain).    Marland Kitchen co-enzyme Q-10 30 MG capsule Take 1 capsule (30 mg total) by mouth daily. (Patient not taking: Reported on 05/02/2015) 90 capsule 3   No facility-administered medications prior to visit.    Review of Systems   Patient denies headache, fevers, malaise, unintentional weight loss, skin rash, eye pain, sinus congestion and sinus pain, sore throat, dysphagia,  hemoptysis , cough, dyspnea, wheezing, chest pain, palpitations, orthopnea, edema, abdominal pain, nausea, melena, diarrhea, constipation, flank pain, dysuria, hematuria, urinary  Frequency, nocturia, numbness, tingling, seizures,  Focal weakness, Loss of consciousness,  Tremor, insomnia, depression, anxiety, and suicidal ideation.      Objective:  BP 132/78 mmHg  Pulse 58  Temp(Src) 98.3 F (36.8 C) (Oral)  Resp 12  Ht 5' 2.25" (1.581 m)  Wt 138 lb 6 oz (62.766 kg)  BMI 25.11 kg/m2  SpO2 97%  Physical Exam    General appearance: alert, cooperative and appears stated age Head: Normocephalic, without obvious abnormality, atraumatic Eyes: conjunctivae/corneas clear. PERRL, EOM's intact. Fundi benign. Ears: normal TM's and external ear canals both ears Nose: Nares normal. Septum midline. Mucosa normal. No drainage or sinus tenderness. Throat: lips, mucosa, and tongue normal; teeth and gums normal Neck: no adenopathy, no carotid bruit, no JVD, supple, symmetrical, trachea midline and thyroid not enlarged, symmetric, no tenderness/mass/nodules Lungs: clear to auscultation bilaterally Breasts: normal appearance, no masses or tenderness Heart: regular rate and rhythm, S1, S2 normal, no murmur, click, rub or gallop Abdomen: soft, non-tender; bowel sounds normal; no masses,  no organomegaly Extremities: extremities normal, atraumatic, no cyanosis or edema Pulses: 2+ and symmetric Skin: Skin color, texture, turgor normal. No rashes or lesions Neurologic: Alert and oriented X 3, normal strength and tone. Normal symmetric reflexes. Normal coordination and gait.     Assessment & Plan:   Problem List Items Addressed This Visit    Encounter for preventive health examination    Annual wellness  exam was done as well as a comprehensive physical exam  .  During the course of the visit the patient was educated and counseled about appropriate screening and preventive services and screenings were brought up to date for cervical and breast  cancer .  She will return for fasting labs to provide samples for diabetes screening and lipid analysis with projected  10 year  risk for CAD. nutrition counseling, skin cancer screening has been recommended, along with review of the age appropriate recommended immunizations.  Printed recommendations for health maintenance screenings was given.        Tobacco abuse counseling    Chantix advised and refilled.       Hyperlipidemia   Relevant Orders   Lipid panel (Completed)     Other Visit Diagnoses    Breast cancer screening    -  Primary    Relevant Orders    MM DIGITAL SCREENING BILATERAL (Completed)    Long-term use of high-risk medication        Relevant Orders    Comprehensive metabolic panel (Completed)    Encounter for immunization           I have discontinued Ms. Maker's co-enzyme Q-10 and atorvastatin. I am also having her start on varenicline. Additionally, I am having her maintain her meloxicam, escitalopram, clopidogrel, acetaminophen, omeprazole, and ALPRAZolam.  Meds ordered this encounter  Medications  . ALPRAZolam (XANAX) 1 MG tablet    Sig: TAKE 1 TABLET BY MOUTH AT BEDTIME for insomnia    Dispense:  30 tablet    Refill:  5    May Refill now  . varenicline (CHANTIX CONTINUING MONTH PAK) 1 MG tablet    Sig: Take 1 tablet (1 mg total) by mouth 2 (two) times daily.    Dispense:  60 tablet    Refill:  3    Medications Discontinued During This Encounter  Medication Reason  . atorvastatin (LIPITOR) 20 MG tablet Duplicate  . co-enzyme Q-10 30 MG capsule Patient Preference  . ALPRAZolam (XANAX) 1 MG tablet Reorder    Follow-up: No Follow-up on file.   Crecencio Mc, MD

## 2015-10-03 NOTE — Patient Instructions (Signed)
I am refilling your chantix and alprazolam today.  Let me know if it helps with the smoking urges.   Mammogram has been ordered  Menopause is a normal process in which your reproductive ability comes to an end. This process happens gradually over a span of months to years, usually between the ages of 62 and 60. Menopause is complete when you have missed 12 consecutive menstrual periods. It is important to talk with your health care provider about some of the most common conditions that affect postmenopausal women, such as heart disease, cancer, and bone loss (osteoporosis). Adopting a healthy lifestyle and getting preventive care can help to promote your health and wellness. Those actions can also lower your chances of developing some of these common conditions. WHAT SHOULD I KNOW ABOUT MENOPAUSE? During menopause, you may experience a number of symptoms, such as:  Moderate-to-severe hot flashes.  Night sweats.  Decrease in sex drive.  Mood swings.  Headaches.  Tiredness.  Irritability.  Memory problems.  Insomnia. Choosing to treat or not to treat menopausal changes is an individual decision that you make with your health care provider. WHAT SHOULD I KNOW ABOUT HORMONE REPLACEMENT THERAPY AND SUPPLEMENTS? Hormone therapy products are effective for treating symptoms that are associated with menopause, such as hot flashes and night sweats. Hormone replacement carries certain risks, especially as you become older. If you are thinking about using estrogen or estrogen with progestin treatments, discuss the benefits and risks with your health care provider. WHAT SHOULD I KNOW ABOUT HEART DISEASE AND STROKE? Heart disease, heart attack, and stroke become more likely as you age. This may be due, in part, to the hormonal changes that your body experiences during menopause. These can affect how your body processes dietary fats, triglycerides, and cholesterol. Heart attack and stroke are both  medical emergencies. There are many things that you can do to help prevent heart disease and stroke:  Have your blood pressure checked at least every 1-2 years. High blood pressure causes heart disease and increases the risk of stroke.  If you are 18-27 years old, ask your health care provider if you should take aspirin to prevent a heart attack or a stroke.  Do not use any tobacco products, including cigarettes, chewing tobacco, or electronic cigarettes. If you need help quitting, ask your health care provider.  It is important to eat a healthy diet and maintain a healthy weight.  Be sure to include plenty of vegetables, fruits, low-fat dairy products, and lean protein.  Avoid eating foods that are high in solid fats, added sugars, or salt (sodium).  Get regular exercise. This is one of the most important things that you can do for your health.  Try to exercise for at least 150 minutes each week. The type of exercise that you do should increase your heart rate and make you sweat. This is known as moderate-intensity exercise.  Try to do strengthening exercises at least twice each week. Do these in addition to the moderate-intensity exercise.  Know your numbers.Ask your health care provider to check your cholesterol and your blood glucose. Continue to have your blood tested as directed by your health care provider. WHAT SHOULD I KNOW ABOUT CANCER SCREENING? There are several types of cancer. Take the following steps to reduce your risk and to catch any cancer development as early as possible. Breast Cancer  Practice breast self-awareness.  This means understanding how your breasts normally appear and feel.  It also means doing regular breast  self-exams. Let your health care provider know about any changes, no matter how small.  If you are 63 or older, have a clinician do a breast exam (clinical breast exam or CBE) every year. Depending on your age, family history, and medical history,  it may be recommended that you also have a yearly breast X-ray (mammogram).  If you have a family history of breast cancer, talk with your health care provider about genetic screening.  If you are at high risk for breast cancer, talk with your health care provider about having an MRI and a mammogram every year.  Breast cancer (BRCA) gene test is recommended for women who have family members with BRCA-related cancers. Results of the assessment will determine the need for genetic counseling and BRCA1 and for BRCA2 testing. BRCA-related cancers include these types:  Breast. This occurs in males or females.  Ovarian.  Tubal. This may also be called fallopian tube cancer.  Cancer of the abdominal or pelvic lining (peritoneal cancer).  Prostate.  Pancreatic. Cervical, Uterine, and Ovarian Cancer Your health care provider may recommend that you be screened regularly for cancer of the pelvic organs. These include your ovaries, uterus, and vagina. This screening involves a pelvic exam, which includes checking for microscopic changes to the surface of your cervix (Pap test).  For women ages 21-65, health care providers may recommend a pelvic exam and a Pap test every three years. For women ages 43-65, they may recommend the Pap test and pelvic exam, combined with testing for human papilloma virus (HPV), every five years. Some types of HPV increase your risk of cervical cancer. Testing for HPV may also be done on women of any age who have unclear Pap test results.  Other health care providers may not recommend any screening for nonpregnant women who are considered low risk for pelvic cancer and have no symptoms. Ask your health care provider if a screening pelvic exam is right for you.  If you have had past treatment for cervical cancer or a condition that could lead to cancer, you need Pap tests and screening for cancer for at least 20 years after your treatment. If Pap tests have been discontinued  for you, your risk factors (such as having a new sexual partner) need to be reassessed to determine if you should start having screenings again. Some women have medical problems that increase the chance of getting cervical cancer. In these cases, your health care provider may recommend that you have screening and Pap tests more often.  If you have a family history of uterine cancer or ovarian cancer, talk with your health care provider about genetic screening.  If you have vaginal bleeding after reaching menopause, tell your health care provider.  There are currently no reliable tests available to screen for ovarian cancer. Lung Cancer Lung cancer screening is recommended for adults 62-32 years old who are at high risk for lung cancer because of a history of smoking. A yearly low-dose CT scan of the lungs is recommended if you:  Currently smoke.  Have a history of at least 30 pack-years of smoking and you currently smoke or have quit within the past 15 years. A pack-year is smoking an average of one pack of cigarettes per day for one year. Yearly screening should:  Continue until it has been 15 years since you quit.  Stop if you develop a health problem that would prevent you from having lung cancer treatment. Colorectal Cancer  This type of cancer can  be detected and can often be prevented.  Routine colorectal cancer screening usually begins at age 73 and continues through age 110.  If you have risk factors for colon cancer, your health care provider may recommend that you be screened at an earlier age.  If you have a family history of colorectal cancer, talk with your health care provider about genetic screening.  Your health care provider may also recommend using home test kits to check for hidden blood in your stool.  A small camera at the end of a tube can be used to examine your colon directly (sigmoidoscopy or colonoscopy). This is done to check for the earliest forms of  colorectal cancer.  Direct examination of the colon should be repeated every 5-10 years until age 24. However, if early forms of precancerous polyps or small growths are found or if you have a family history or genetic risk for colorectal cancer, you may need to be screened more often. Skin Cancer  Check your skin from head to toe regularly.  Monitor any moles. Be sure to tell your health care provider:  About any new moles or changes in moles, especially if there is a change in a mole's shape or color.  If you have a mole that is larger than the size of a pencil eraser.  If any of your family members has a history of skin cancer, especially at a young age, talk with your health care provider about genetic screening.  Always use sunscreen. Apply sunscreen liberally and repeatedly throughout the day.  Whenever you are outside, protect yourself by wearing long sleeves, pants, a wide-brimmed hat, and sunglasses. WHAT SHOULD I KNOW ABOUT OSTEOPOROSIS? Osteoporosis is a condition in which bone destruction happens more quickly than new bone creation. After menopause, you may be at an increased risk for osteoporosis. To help prevent osteoporosis or the bone fractures that can happen because of osteoporosis, the following is recommended:  If you are 61-53 years old, get at least 1,000 mg of calcium and at least 600 mg of vitamin D per day.  If you are older than age 42 but younger than age 63, get at least 1,200 mg of calcium and at least 600 mg of vitamin D per day.  If you are older than age 44, get at least 1,200 mg of calcium and at least 800 mg of vitamin D per day. Smoking and excessive alcohol intake increase the risk of osteoporosis. Eat foods that are rich in calcium and vitamin D, and do weight-bearing exercises several times each week as directed by your health care provider. WHAT SHOULD I KNOW ABOUT HOW MENOPAUSE AFFECTS [place]? Depression may occur at any age, but it is  more common as you become older. Common symptoms of depression include:  Low or sad mood.  Changes in sleep patterns.  Changes in appetite or eating patterns.  Feeling an overall lack of motivation or enjoyment of activities that you previously enjoyed.  Frequent crying spells. Talk with your health care provider if you think that you are experiencing depression. WHAT SHOULD I KNOW ABOUT IMMUNIZATIONS? It is important that you get and maintain your immunizations. These include:  Tetanus, diphtheria, and pertussis (Tdap) booster vaccine.  Influenza every year before the flu season begins.  Pneumonia vaccine.  Shingles vaccine. Your health care provider may also recommend other immunizations.   This information is not intended to replace advice given to you by your health care provider. Make sure you discuss  any questions you have with your health care provider.   Document Released: 01/18/2006 Document Revised: 12/17/2014 Document Reviewed: 07/29/2014 Elsevier Interactive Patient Education [employer].

## 2015-10-03 NOTE — Progress Notes (Signed)
Pre-visit discussion using our clinic review tool. No additional management support is needed unless otherwise documented below in the visit note.  

## 2015-10-04 DIAGNOSIS — Z716 Tobacco abuse counseling: Secondary | ICD-10-CM | POA: Insufficient documentation

## 2015-10-04 LAB — LIPID PANEL
Cholesterol: 251 mg/dL — ABNORMAL HIGH (ref 0–200)
HDL: 61.2 mg/dL (ref >39.00)
LDL Cholesterol: 162 mg/dL — ABNORMAL HIGH (ref 0–99)
NonHDL: 189.82
Total CHOL/HDL Ratio: 4
Triglycerides: 137 mg/dL (ref 0.0–149.0)
VLDL: 27.4 mg/dL (ref 0.0–40.0)

## 2015-10-04 LAB — COMPREHENSIVE METABOLIC PANEL
ALT: 8 U/L (ref 0–35)
AST: 17 U/L (ref 0–37)
Albumin: 4.3 g/dL (ref 3.5–5.2)
Alkaline Phosphatase: 58 U/L (ref 39–117)
BUN: 15 mg/dL (ref 6–23)
CO2: 24 meq/L (ref 19–32)
Calcium: 10.3 mg/dL (ref 8.4–10.5)
Chloride: 103 mEq/L (ref 96–112)
Creatinine, Ser: 0.88 mg/dL (ref 0.40–1.20)
GFR: 69.69 mL/min (ref >60.00)
GLUCOSE: 85 mg/dL (ref 70–99)
POTASSIUM: 4.2 meq/L (ref 3.5–5.1)
SODIUM: 137 meq/L (ref 135–145)
Total Bilirubin: 0.3 mg/dL (ref 0.2–1.2)
Total Protein: 7.3 g/dL (ref 6.0–8.3)

## 2015-10-04 NOTE — Assessment & Plan Note (Signed)

## 2015-10-04 NOTE — Assessment & Plan Note (Signed)
Chantix advised and refilled.

## 2015-10-05 ENCOUNTER — Encounter: Payer: Self-pay | Admitting: Internal Medicine

## 2015-10-06 MED ORDER — ROSUVASTATIN CALCIUM 20 MG PO TABS: 20.0000 mg | ORAL_TABLET | Freq: Every day | ORAL | Status: DC

## 2015-10-14 ENCOUNTER — Ambulatory Visit
Admission: RE | Admit: 2015-10-14 | Discharge: 2015-10-14 | Disposition: A | Discharge status: Home / Self Care | Payer: BLUE CROSS/BLUE SHIELD | Source: Ambulatory Visit | Attending: Internal Medicine | Admitting: Internal Medicine

## 2015-10-14 DIAGNOSIS — Z1239 Encounter for other screening for malignant neoplasm of breast: Secondary | ICD-10-CM

## 2015-10-19 NOTE — Addendum Note (Signed)
Addended by: Crecencio Mc on: 10/19/2015 10:09 AM   Modules accepted: SmartSet

## 2015-11-24 ENCOUNTER — Other Ambulatory Visit: Payer: Self-pay | Admitting: Internal Medicine

## 2016-01-15 ENCOUNTER — Other Ambulatory Visit: Payer: Self-pay | Admitting: Internal Medicine

## 2016-01-18 ENCOUNTER — Other Ambulatory Visit (HOSPITAL_COMMUNITY): Payer: Self-pay | Admitting: Interventional Radiology

## 2016-01-18 DIAGNOSIS — I729 Aneurysm of unspecified site: Secondary | ICD-10-CM

## 2016-01-18 DIAGNOSIS — W19XXXA Unspecified fall, initial encounter: Secondary | ICD-10-CM

## 2016-01-18 DIAGNOSIS — R519 Headache, unspecified: Secondary | ICD-10-CM

## 2016-01-18 DIAGNOSIS — R51 Headache: Secondary | ICD-10-CM

## 2016-01-26 ENCOUNTER — Other Ambulatory Visit: Payer: Self-pay | Admitting: Radiology

## 2016-01-27 ENCOUNTER — Encounter (HOSPITAL_COMMUNITY): Payer: Self-pay

## 2016-01-27 ENCOUNTER — Ambulatory Visit (HOSPITAL_COMMUNITY)
Admission: RE | Admit: 2016-01-27 | Discharge: 2016-01-27 | Disposition: A | Discharge status: Home / Self Care | Payer: BLUE CROSS/BLUE SHIELD | Source: Ambulatory Visit | Attending: Interventional Radiology | Admitting: Interventional Radiology

## 2016-01-27 ENCOUNTER — Other Ambulatory Visit (HOSPITAL_COMMUNITY): Payer: Self-pay | Admitting: Interventional Radiology

## 2016-01-27 DIAGNOSIS — Z87891 Personal history of nicotine dependence: Secondary | ICD-10-CM | POA: Insufficient documentation

## 2016-01-27 DIAGNOSIS — I671 Cerebral aneurysm, nonruptured: Secondary | ICD-10-CM | POA: Diagnosis not present

## 2016-01-27 DIAGNOSIS — W19XXXA Unspecified fall, initial encounter: Secondary | ICD-10-CM

## 2016-01-27 DIAGNOSIS — Z8249 Family history of ischemic heart disease and other diseases of the circulatory system: Secondary | ICD-10-CM | POA: Diagnosis not present

## 2016-01-27 DIAGNOSIS — R51 Headache: Secondary | ICD-10-CM | POA: Insufficient documentation

## 2016-01-27 DIAGNOSIS — K219 Gastro-esophageal reflux disease without esophagitis: Secondary | ICD-10-CM | POA: Insufficient documentation

## 2016-01-27 DIAGNOSIS — Z7902 Long term (current) use of antithrombotics/antiplatelets: Secondary | ICD-10-CM | POA: Diagnosis not present

## 2016-01-27 DIAGNOSIS — Z823 Family history of stroke: Secondary | ICD-10-CM | POA: Insufficient documentation

## 2016-01-27 DIAGNOSIS — F329 Major depressive disorder, single episode, unspecified: Secondary | ICD-10-CM | POA: Diagnosis not present

## 2016-01-27 DIAGNOSIS — I729 Aneurysm of unspecified site: Secondary | ICD-10-CM

## 2016-01-27 DIAGNOSIS — K449 Diaphragmatic hernia without obstruction or gangrene: Secondary | ICD-10-CM | POA: Diagnosis not present

## 2016-01-27 DIAGNOSIS — E785 Hyperlipidemia, unspecified: Secondary | ICD-10-CM | POA: Insufficient documentation

## 2016-01-27 DIAGNOSIS — R519 Headache, unspecified: Secondary | ICD-10-CM

## 2016-01-27 LAB — CBC
HCT: 39.3 % (ref 36.0–46.0)
HCT: 37.7 % (ref 36.0–46.0)
Hemoglobin: 13.3 g/dL (ref 12.0–15.0)
Hemoglobin: 12.7 g/dL (ref 12.0–15.0)
MCH: 31 pg (ref 26.0–34.0)
MCH: 30.8 pg (ref 26.0–34.0)
MCHC: 33.8 g/dL (ref 30.0–36.0)
MCHC: 33.7 g/dL (ref 30.0–36.0)
MCV: 91.6 fL (ref 78.0–100.0)
MCV: 91.5 fL (ref 78.0–100.0)
PLATELETS: UNDETERMINED 10*3/uL (ref 150–400)
PLATELETS: 154 10*3/uL (ref 150–400)
RBC: 4.29 MIL/uL (ref 3.87–5.11)
RBC: 4.12 MIL/uL (ref 3.87–5.11)
RDW: 13.7 % (ref 11.5–15.5)
RDW: 13.7 % (ref 11.5–15.5)
WBC: 7.4 10*3/uL (ref 4.0–10.5)
WBC: 7.1 10*3/uL (ref 4.0–10.5)

## 2016-01-27 LAB — PROTIME-INR
INR: 0.99 (ref 0.00–1.49)
Prothrombin Time: 13.3 seconds (ref 11.6–15.2)

## 2016-01-27 LAB — BASIC METABOLIC PANEL
Anion gap: 7 (ref 5–15)
BUN: 13 mg/dL (ref 6–20)
CO2: 25 mmol/L (ref 22–32)
CREATININE: 0.87 mg/dL (ref 0.44–1.00)
Calcium: 9.4 mg/dL (ref 8.9–10.3)
Chloride: 110 mmol/L (ref 101–111)
Glucose, Bld: 95 mg/dL (ref 65–99)
POTASSIUM: 4.1 mmol/L (ref 3.5–5.1)
SODIUM: 142 mmol/L (ref 135–145)

## 2016-01-27 LAB — APTT: APTT: 29 s (ref 24–37)

## 2016-01-27 MED ORDER — MIDAZOLAM HCL 2 MG/2ML IJ SOLN
INTRAMUSCULAR | Status: AC
  Filled 2016-01-27: qty 2

## 2016-01-27 MED ORDER — LIDOCAINE HCL 1 % IJ SOLN
INTRAMUSCULAR | Status: AC
  Filled 2016-01-27: qty 20

## 2016-01-27 MED ORDER — MIDAZOLAM HCL 2 MG/2ML IJ SOLN
INTRAMUSCULAR | Status: AC | PRN
  Administered 2016-01-27: 1 mg via INTRAVENOUS

## 2016-01-27 MED ORDER — FENTANYL CITRATE (PF) 100 MCG/2ML IJ SOLN
INTRAMUSCULAR | Status: AC
  Filled 2016-01-27: qty 2

## 2016-01-27 MED ORDER — HEPARIN SOD (PORK) LOCK FLUSH 100 UNIT/ML IV SOLN
INTRAVENOUS | Status: AC
  Filled 2016-01-27: qty 20

## 2016-01-27 MED ORDER — SODIUM CHLORIDE 0.9 % IV SOLN: INTRAVENOUS | Status: AC

## 2016-01-27 MED ORDER — FENTANYL CITRATE (PF) 100 MCG/2ML IJ SOLN
INTRAMUSCULAR | Status: AC | PRN
  Administered 2016-01-27: 25 ug via INTRAVENOUS

## 2016-01-27 MED ORDER — IOHEXOL 300 MG/ML  SOLN
150.0000 mL | Freq: Once | INTRAMUSCULAR | Status: AC | PRN
  Administered 2016-01-27: 80 mL via INTRA_ARTERIAL

## 2016-01-27 MED ORDER — SODIUM CHLORIDE 0.9 % IV SOLN
INTRAVENOUS | Status: DC
  Administered 2016-01-27: 08:00:00 via INTRAVENOUS

## 2016-01-27 NOTE — Sedation Documentation (Signed)
Patient denies pain and is resting comfortably.  

## 2016-01-27 NOTE — Discharge Instructions (Signed)

## 2016-01-27 NOTE — H&P (Signed)
Chief Complaint: Patient was seen in consultation today for cerebral arteriogram at the request of Dr Deborra Medina  Referring Physician(s): Dr Deborra Medina  History of Present Illness: Katrina Woods is a 60 y.o. 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 Bursitis of left hip 8-13    takes Meloxicam daily   . Hiatal hernia 209  . Hyperlipidemia     takes Atorvastatin daily  . Aneurysm ([place])     cerebral  . Insomnia     takes Xanax nightly  . GERD (gastroesophageal reflux disease)     takes Omeprazole daily  . Depression     takes Lexapro daily  . History of bronchitis 2015  . Headache   . Weakness     numbness and tingling-thinks its carpal tunnel  . Back pain   . History of colon polyps     benign  . History of kidney stones   . History of shingles     Past Surgical History  Procedure Laterality Date  . Abdominal hysterectomy  2004    total  . Knee cartilage surgery Left 1987  . Cesarean section  11-25-82  . Wisdom tooth extraction  1986    all four extracted  . Cerebral aneurysm repair  2006    Deveshwar, coil   . Colonoscopy    . Esophagogastroduodenoscopy    . Radiology with anesthesia N/A 05/11/2015    Procedure: ANEURYSM EMBOLIZATION;  Surgeon: Luanne Bras, MD;  Location: [place];  Service: Radiology;  Laterality: N/A;    Allergies: Statins  Medications: Prior to Admission medications   Medication Sig Start Date End Date Taking? Authorizing Provider  ALPRAZolam (XANAX) 1 MG tablet TAKE 1 TABLET BY MOUTH AT BEDTIME for insomnia Patient taking differently: Take 1 mg by mouth at bedtime as needed for sleep. TAKE 1 TABLET BY MOUTH AT BEDTIME for insomnia 10/03/15  Yes  Crecencio Mc, MD  clopidogrel (PLAVIX) 75 MG tablet Take 75 mg by mouth daily.   Yes Historical Provider, MD  clorazepate (TRANXENE) 7.5 MG tablet Take 3.75 mg by mouth 2 (two) times daily as needed (for muscle spasms).    Yes Historical Provider, MD  escitalopram (LEXAPRO) 5 MG tablet TAKE 1 TABLET (5 MG TOTAL) BY MOUTH DAILY. 01/16/16  Yes Crecencio Mc, MD  gabapentin (NEURONTIN) 300 MG capsule Take 300-600 mg by mouth 3 (three) times daily. 600 mg in the morning, 300 mg at noon, 300 mg in the afternoon around 1600, and 600 mg at bedtime   Yes Historical Provider, MD  ibuprofen (ADVIL,MOTRIN) 200 MG tablet Take 800 mg by mouth every 6 (six) hours as needed for headache.   Yes Historical Provider, MD  omeprazole (PRILOSEC) 40 MG capsule TAKE 1 CAPSULE (40 MG TOTAL) BY MOUTH DAILY. 08/17/15  Yes Crecencio Mc, MD  rosuvastatin (CRESTOR) 20 MG tablet Take 1 tablet (20 mg total) by mouth daily. 10/06/15  Yes Crecencio Mc, MD  varenicline (CHANTIX CONTINUING MONTH PAK) 1 MG tablet Take 1 tablet (1 mg total) by mouth 2 (two) times daily. 10/03/15  Yes Crecencio Mc, MD  meloxicam (MOBIC) 15 MG tablet Take 1 tablet (15  mg total) by mouth daily. Patient not taking: Reported on 01/23/2016 02/14/15   Crecencio Mc, MD     Family History  Problem Relation Age of Onset  . Diabetes Mother     type 2  . Hypertension Mother   . Heart disease Mother     CHF  . Cancer Mother     lung- previous smoker- had quit 12 yrs before  . Appendicitis Father     acute  . Cancer Father     prostate?  . Diabetes Brother 57    type 2  . Hypertension Brother   . Cancer Maternal Grandmother     breast, uterine  . Hypertension Maternal Grandmother   . Heart disease Maternal Grandmother     CHF  . Diabetes Maternal Grandmother   . Stroke Maternal Grandfather   . Heart block Paternal Grandfather     massive  . Colon cancer Neg Hx   . Stomach cancer Neg Hx     Social History   Social History  . Marital  Status: Single    Spouse Name: N/A  . Number of Children: N/A  . Years of Education: N/A   Social History Main Topics  . Smoking status: Former Smoker    Types: Cigarettes  . Smokeless tobacco: Never Used     Comment: quit smoking in Nov 2015  . Alcohol Use: 0.0 oz/week    0 Standard drinks or equivalent per week     Comment: occasional wine  . Drug Use: No  . Sexual Activity: Not Currently   Other Topics Concern  . None   Social History Narrative     Review of Systems: A 12 point ROS discussed and pertinent positives are indicated in the HPI above.  All other systems are negative.  Review of Systems  Constitutional: Negative for activity change and fatigue.  HENT: Negative for tinnitus.   Eyes: Negative for visual disturbance.  Respiratory: Negative for shortness of breath.   Gastrointestinal: Negative for nausea and vomiting.  Musculoskeletal: Negative for back pain.  Neurological: Positive for headaches. Negative for weakness and light-headedness.  Psychiatric/Behavioral: Negative for behavioral problems and confusion.    Vital Signs: BP 112/69 mmHg  Pulse 59  Temp(Src) 97.4 F (36.3 C)  Resp 18  Ht 5\' 2"  (1.575 m)  Wt 138 lb (62.596 kg)  BMI 25.23 kg/m2  SpO2 99%  Physical Exam  Constitutional: She is oriented to person, place, and time. She appears well-nourished.  HENT:  Head: Atraumatic.  Eyes: EOM are normal.  Neck: Normal range of motion.  Cardiovascular: Normal rate, regular rhythm and normal heart sounds.   Pulmonary/Chest: Effort normal and breath sounds normal. She has no wheezes.  Abdominal: Soft. Bowel sounds are normal. There is no tenderness.  Musculoskeletal: Normal range of motion.  Neurological: She is alert and oriented to person, place, and time.  Skin: Skin is warm and dry.  Psychiatric: She has a normal mood and affect. Her behavior is normal. Judgment and thought content normal.  Nursing note and vitals reviewed.   Mallampati  Score:  MD Evaluation Airway: WNL Heart: WNL Abdomen: WNL Chest/ Lungs: WNL ASA  Classification: 3 Mallampati/Airway Score: One  Imaging: No results found.  Labs:  CBC:  Recent Labs  05/05/15 1031 05/12/15 0530  WBC 5.4 6.5  HGB 13.5 10.7*  HCT 40.5 32.0*  PLT 128* 125*    COAGS:  Recent Labs  05/05/15 1031  INR 0.97  APTT 29  BMP:  Recent Labs  05/05/15 1031 05/12/15 0530 10/03/15 1559 01/27/16 0721  NA 138 140 137 142  K 4.4 4.2 4.2 4.1  CL 106 110 103 110  CO2 24 24 24 25   GLUCOSE 98 95 85 95  BUN 14 6 15 13   CALCIUM 9.8 8.3* 10.3 9.4  CREATININE 0.82 0.76 0.88 0.87  GFRNONAA >60 >60  --  >60  GFRAA >60 >60  --  >60    LIVER FUNCTION TESTS:  Recent Labs  05/05/15 1031 10/03/15 1559  BILITOT 0.6 0.3  AST 21 17  ALT 12* 8  ALKPHOS 49 58  PROT 6.4* 7.3  ALBUMIN 3.9 4.3    TUMOR MARKERS: No results for input(s): AFPTM, CEA, CA199, CHROMGRNA in the last 8760 hours.  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 All of the patient's questions were answered, patient is agreeable to proceed. Consent signed and in chart.  Thank you for this interesting consult.  I greatly enjoyed meeting Laterika Boesch and look forward to participating in their care.  A copy of this report was sent to the requesting provider on this date.  Electronically Signed: Gianfranco Araki A 01/27/2016, 8:01 AM   I spent a total of  30 Minutes   in face to face in clinical consultation, greater than 50% of which was counseling/coordinating care for cerebral arteriogram

## 2016-01-27 NOTE — Procedures (Signed)
S/P  4 vessel cerebral arteriogram. Rt CFA approach. Findings.Marland Kitchen 1.Obliterated basilar tip aneurysm. 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

## 2016-03-02 ENCOUNTER — Other Ambulatory Visit: Payer: Self-pay

## 2016-03-02 ENCOUNTER — Encounter: Payer: Self-pay | Admitting: Family Medicine

## 2016-03-02 ENCOUNTER — Ambulatory Visit (INDEPENDENT_AMBULATORY_CARE_PROVIDER_SITE_OTHER): Payer: BLUE CROSS/BLUE SHIELD | Admitting: Family Medicine

## 2016-03-02 VITALS — BP 112/62 | HR 72 | Temp 97.9°F | Ht 62.0 in | Wt 143.0 lb

## 2016-03-02 DIAGNOSIS — R0789 Other chest pain: Secondary | ICD-10-CM

## 2016-03-02 DIAGNOSIS — B029 Zoster without complications: Secondary | ICD-10-CM

## 2016-03-02 MED ORDER — HYDROCODONE-ACETAMINOPHEN 5-325 MG PO TABS: 1.0000 | ORAL_TABLET | Freq: Three times a day (TID) | ORAL | Status: DC | PRN

## 2016-03-02 MED ORDER — VALACYCLOVIR HCL 1 G PO TABS: 1000.0000 mg | ORAL_TABLET | Freq: Three times a day (TID) | ORAL | Status: DC

## 2016-03-02 MED ORDER — FAMOTIDINE 20 MG PO TABS: 20.0000 mg | ORAL_TABLET | Freq: Two times a day (BID) | ORAL | Status: DC

## 2016-03-02 MED ORDER — HYDROCODONE-ACETAMINOPHEN 10-325 MG PO TABS: 1.0000 | ORAL_TABLET | Freq: Three times a day (TID) | ORAL | Status: DC | PRN

## 2016-03-02 NOTE — Patient Instructions (Signed)
Nice to meet you. The discomfort in your back and the rash is likely related to shingles. We will treat you with Valtrex for this. You can take the hydrocodone for pain as well. If you develop chest pain, shortness of breath, palpitations, cough productive of blood, fevers, worsening pain, or any new or changing symptoms please seek medical attention.

## 2016-03-02 NOTE — Progress Notes (Signed)
Patient ID: Katrina Woods, female   DOB: [birth year], 60 y.o.   MRN: [id number]  Tommi Rumps, MD Phone: [phone]  Katrina Woods is a 60 y.o. female who presents today for same-day visit.  Patient notes yesterday she developed a discomfort in the left mid thoracic area of her back. She had a coworker look at it and noted there was a rash in this area. Notes the pain may radiate around in that dermatome. She notes the rash hurts. She additionally notes she does have subacute thoracic back discomfort from when she fell in December. She states her current pain is different. She notes no numbness or weakness with her current pain. Denies saddle anesthesia, bowel and bladder dysfunction, and history of cancer. She has tried gabapentin and a muscle relaxer with no benefit. She notes the pain in her back can get mildly worse with a deep breath. She notes no shortness of breath. She states yesterday some time after the pain started in her back she started to feel as though she was going to panic and had a mild chest achiness and heaviness that resolved quickly. She noted no shortness of breath or radiation or diaphoresis with this. This was nonexertional. She has not had any recurrence of the chest pain. She denies cardiac history. No history of blood clot. She additionally notes 2 weeks ago she had rhinorrhea and cough associated with a fever of 102F lasting for 1 day. Cough and rhinorrhea resolved on its own. She has not had fever since that time. She reports a history of chickenpox when she was younger. She has had a history of shingles. This feels similar.  PMH: Nonsmoker   ROS see history of present illness  Objective  Physical Exam Filed Vitals:   03/02/16 1106 03/02/16 1136  BP: 108/68 112/62  Pulse: 72   Temp: 97.9 F (36.6 C)     BP Readings from Last 3 Encounters:  03/02/16 112/62  01/27/16 104/74  10/03/15 132/78   Wt Readings from Last 3 Encounters:    03/02/16 143 lb (64.864 kg)  01/27/16 138 lb (62.596 kg)  10/03/15 138 lb 6 oz (62.766 kg)    Physical Exam  Constitutional: She is well-developed, well-nourished, and in no distress.  HENT:  Head: Normocephalic and atraumatic.  Right Ear: External ear normal.  Left Ear: External ear normal.  Mouth/Throat: Oropharynx is clear and moist. No oropharyngeal exudate.  Eyes: Conjunctivae are normal. Pupils are equal, round, and reactive to light.  Cardiovascular: Normal rate, regular rhythm and normal heart sounds.   Pulmonary/Chest: Effort normal and breath sounds normal.  Musculoskeletal:  No midline spine tenderness, no paraspinal muscle tenderness, no erythema overlying the midline spine, no midline spine step-off  Neurological: She is alert.  5/5 strength in bilateral biceps, triceps, grip, quads, hamstrings, plantar and dorsiflexion, sensation to light touch intact in bilateral UE and LE, normal gait, 2+ patellar reflexes  Skin: Skin is warm and dry. She is not diaphoretic.      EKG: Sinus bradycardia, rate 56, PR interval 116 ms, no ST changes or T-wave changes  Assessment/Plan: Please see individual problem list.  Shingles Patient with onset of discomfort in area of rash on back. Rash is isolated to a single dermatome. There is no warmth or induration or fluctuance to indicate cellulitis. Patient did report a single episode of atypical chest discomfort when she was feeling anxiety and panic thus an EKG was performed that was reassuring. She has had no recurrence  of the chest discomfort. Suspect this was likely related to anxiety in a patient with no cardiac history. I discussed obtaining a chest x-ray given the discomfort with deep breaths though she wanted to hold off on this. Her vital signs are stable with normal heart rate and pulse ox making VTE an unlikely cause of this discomfort. Doubt vascular cause given stable vital signs and relatively equal blood pressures in bilateral  arms. Suspect fever from 2 weeks ago was likely related to upper respiratory infection which has resolved at this time. Suspect shingles is most likely diagnosis given rash is isolated to a single dermatome. We'll treat her with Valtrex for shingles given recent onset and Vicodin for pain. Advised the Vicodin can make her drowsy. I discussed return precautions with the patient. She voiced understanding.    Orders Placed This Encounter  Procedures  . EKG 12-Lead    Meds ordered this encounter  Medications  . valACYclovir (VALTREX) 1000 MG tablet    Sig: Take 1 tablet (1,000 mg total) by mouth 3 (three) times daily.    Dispense:  21 tablet    Refill:  0  . DISCONTD: HYDROcodone-acetaminophen (NORCO) 10-325 MG tablet    Sig: Take 1 tablet by mouth every 8 (eight) hours as needed.    Dispense:  20 tablet    Refill:  0  . HYDROcodone-acetaminophen (NORCO/VICODIN) 5-325 MG tablet    Sig: Take 1 tablet by mouth every 8 (eight) hours as needed for moderate pain.    Dispense:  20 tablet    Refill:  0    Tommi Rumps, MD [place]

## 2016-03-02 NOTE — Assessment & Plan Note (Addendum)
Patient with onset of discomfort in area of rash on back. Rash is isolated to a single dermatome. There is no warmth or induration or fluctuance to indicate cellulitis. Patient did report a single episode of atypical chest discomfort when she was feeling anxiety and panic thus an EKG was performed that was reassuring. She has had no recurrence of the chest discomfort. Suspect this was likely related to anxiety in a patient with no cardiac history. I discussed obtaining a chest x-ray given the discomfort with deep breaths though she wanted to hold off on this. Her vital signs are stable with normal heart rate and pulse ox making VTE an unlikely cause of this discomfort. Doubt vascular cause given stable vital signs and relatively equal blood pressures in bilateral arms. Suspect fever from 2 weeks ago was likely related to upper respiratory infection which has resolved at this time. Suspect shingles is most likely diagnosis given rash is isolated to a single dermatome. We'll treat her with Valtrex for shingles given recent onset and Vicodin for pain. Advised the Vicodin can make her drowsy. I discussed return precautions with the patient. She voiced understanding.

## 2016-03-02 NOTE — Progress Notes (Signed)
Pre visit review using our clinic review tool, if applicable. No additional management support is needed unless otherwise documented below in the visit note. 

## 2016-03-02 NOTE — Telephone Encounter (Signed)
This encounter was created in error - please disregard.

## 2016-03-03 ENCOUNTER — Encounter: Payer: Self-pay | Admitting: Family Medicine

## 2016-03-13 ENCOUNTER — Other Ambulatory Visit: Payer: Self-pay | Admitting: Internal Medicine

## 2016-03-26 ENCOUNTER — Other Ambulatory Visit: Payer: Self-pay | Admitting: Internal Medicine

## 2016-03-26 NOTE — Telephone Encounter (Signed)
This is a historical medication and pharmacy states it interacts with Omeprazole 40 mg. They want to know if you are okay with these to medications together. Please advise?

## 2016-03-27 NOTE — Telephone Encounter (Signed)
Do not refill until you speak with patient.  I have never refilled her plavix and with her history of brain aneurysm,s I do not know why she would be taking something that could cuase  Bleedin g

## 2016-03-27 NOTE — Telephone Encounter (Signed)
Her neuro- radiologist who treats her for aneurysm's he has been prescribing this medication. Patient stated she had to have this medication.

## 2016-03-28 MED ORDER — CLOPIDOGREL BISULFATE 75 MG PO TABS: 75.0000 mg | ORAL_TABLET | Freq: Every day | ORAL | Status: DC

## 2016-03-28 NOTE — Telephone Encounter (Signed)
Refilled

## 2016-03-31 ENCOUNTER — Other Ambulatory Visit: Payer: Self-pay | Admitting: Internal Medicine

## 2016-04-26 ENCOUNTER — Other Ambulatory Visit: Payer: Self-pay | Admitting: Internal Medicine

## 2016-08-31 ENCOUNTER — Other Ambulatory Visit (HOSPITAL_COMMUNITY): Payer: Self-pay | Admitting: Interventional Radiology

## 2016-08-31 DIAGNOSIS — I729 Aneurysm of unspecified site: Secondary | ICD-10-CM

## 2016-09-13 ENCOUNTER — Encounter (HOSPITAL_COMMUNITY): Payer: Self-pay

## 2016-09-13 ENCOUNTER — Ambulatory Visit (HOSPITAL_COMMUNITY): Payer: BLUE CROSS/BLUE SHIELD

## 2016-09-13 ENCOUNTER — Ambulatory Visit (HOSPITAL_COMMUNITY)
Admission: RE | Admit: 2016-09-13 | Discharge: 2016-09-13 | Disposition: A | Discharge status: Home / Self Care | Payer: BLUE CROSS/BLUE SHIELD | Source: Ambulatory Visit | Attending: Interventional Radiology | Admitting: Interventional Radiology

## 2016-09-13 DIAGNOSIS — I729 Aneurysm of unspecified site: Secondary | ICD-10-CM

## 2016-09-13 DIAGNOSIS — I671 Cerebral aneurysm, nonruptured: Secondary | ICD-10-CM | POA: Insufficient documentation

## 2016-09-13 DIAGNOSIS — I6782 Cerebral ischemia: Secondary | ICD-10-CM | POA: Insufficient documentation

## 2016-09-13 DIAGNOSIS — I720 Aneurysm of carotid artery: Secondary | ICD-10-CM | POA: Insufficient documentation

## 2016-09-13 LAB — CREATININE, SERUM
CREATININE: 0.76 mg/dL (ref 0.44–1.00)
GFR calc Af Amer: >60 mL/min (ref >60)

## 2016-09-13 MED ORDER — GADOBENATE DIMEGLUMINE 529 MG/ML IV SOLN
15.0000 mL | Freq: Once | INTRAVENOUS | Status: AC | PRN
  Administered 2016-09-13: 15 mL via INTRAVENOUS

## 2016-09-28 IMAGING — XA IR ANGIO VETEBRAL SEL VERTEBRAL BILAT MOD SED
1 series · 10 of 24 positions shown · IV contrast (IODINE)
Comparison: none

CLINICAL DATA: Persistent headaches. Previous history of
endovascularly treated basilar apex aneurysm. Follow-up reveals
enlarging neck remnant.

[Series 300: neuro · 10 of 335 slices shown]
[im 15/335]
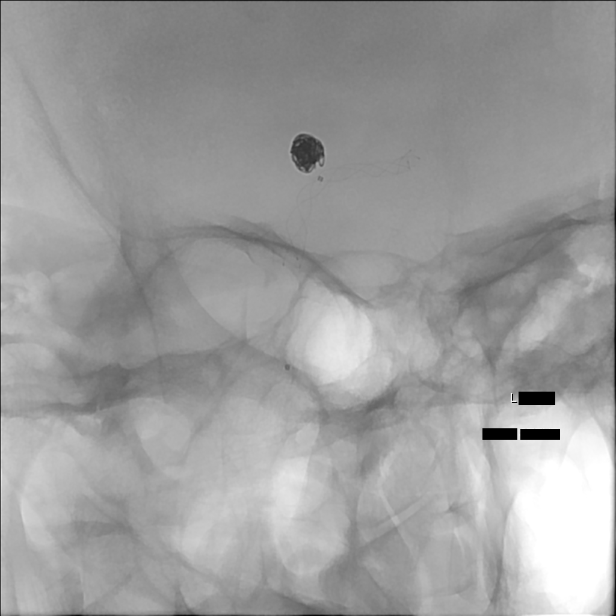
[im 44/335]
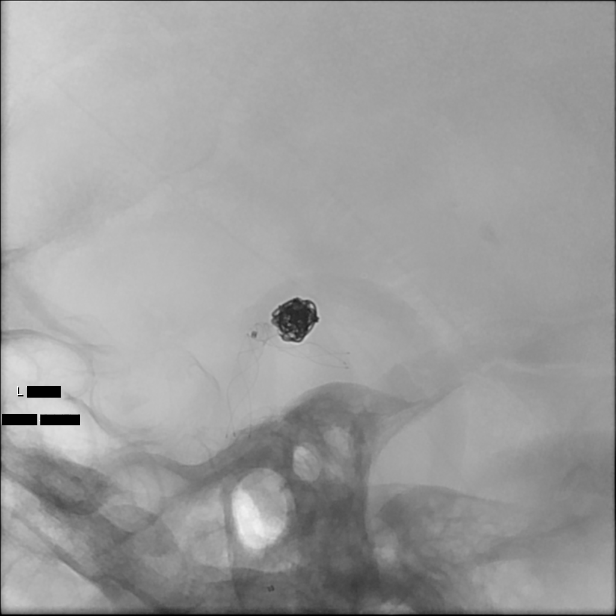
[im 88/335]
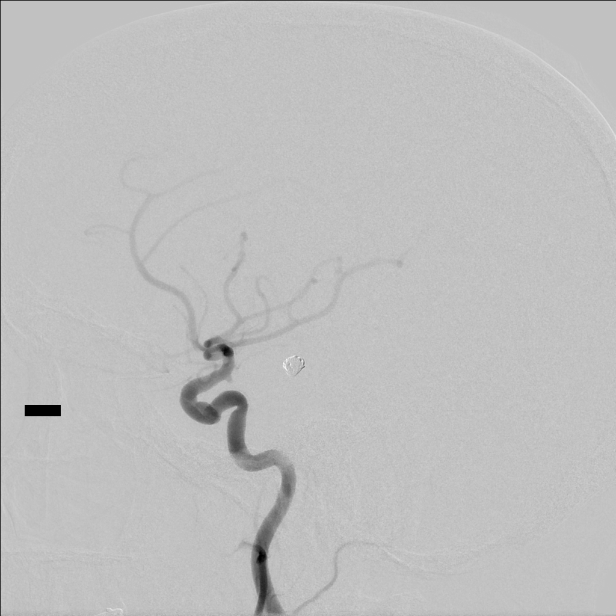
[im 117/335]
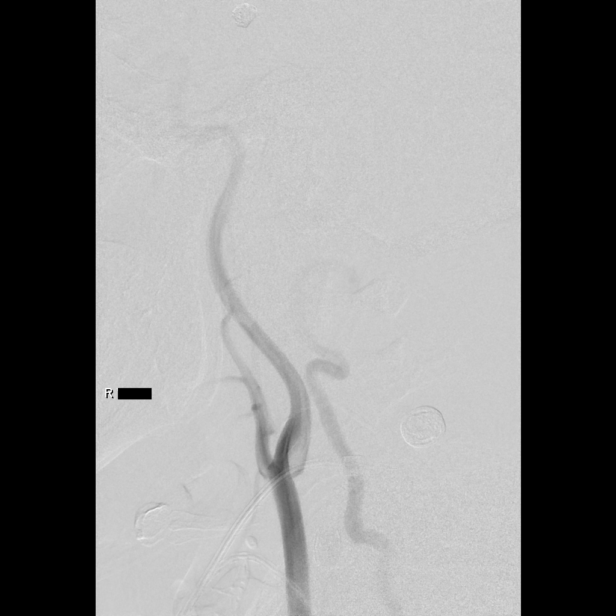
[im 146/335]
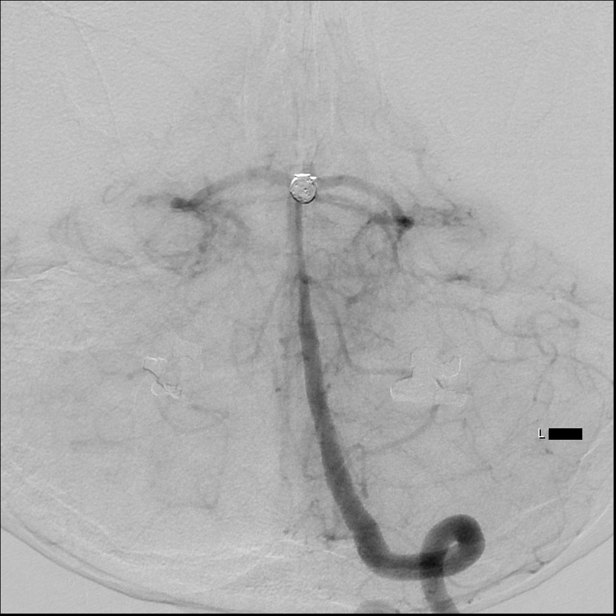
[im 189/335]
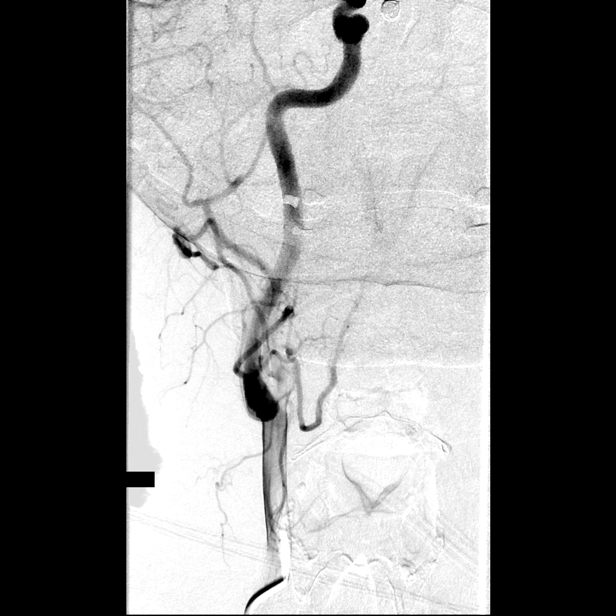
[im 218/335]
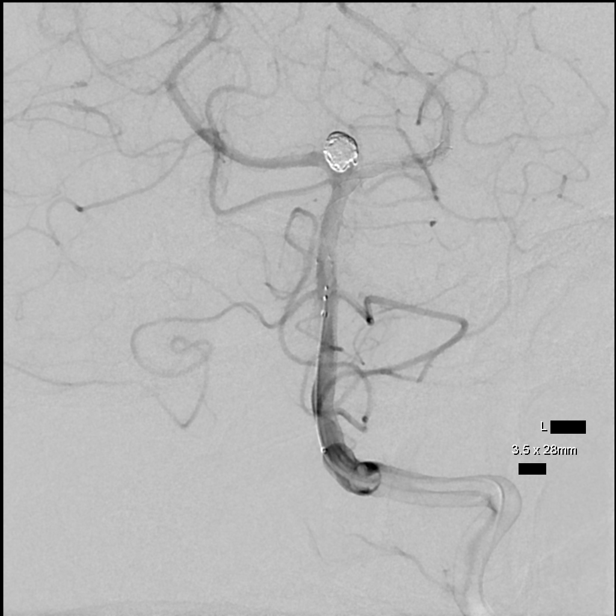
[im 262/335]
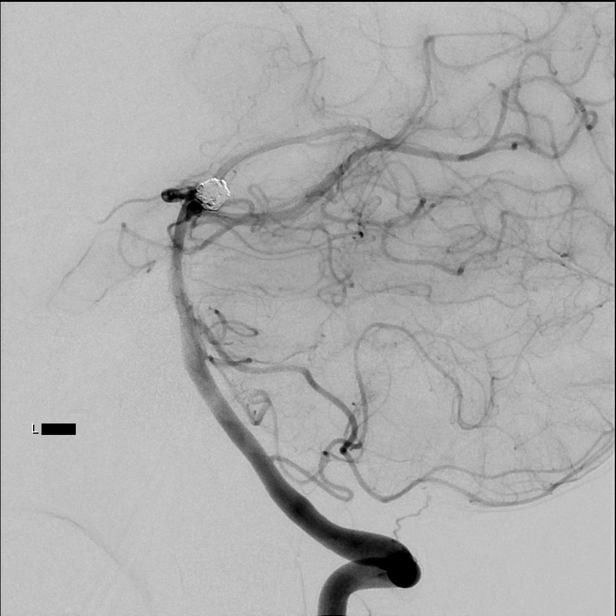
[im 291/335]
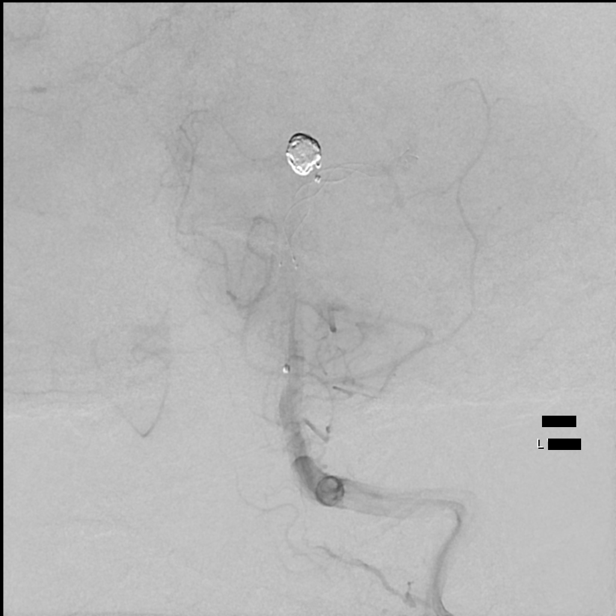
[im 320/335]
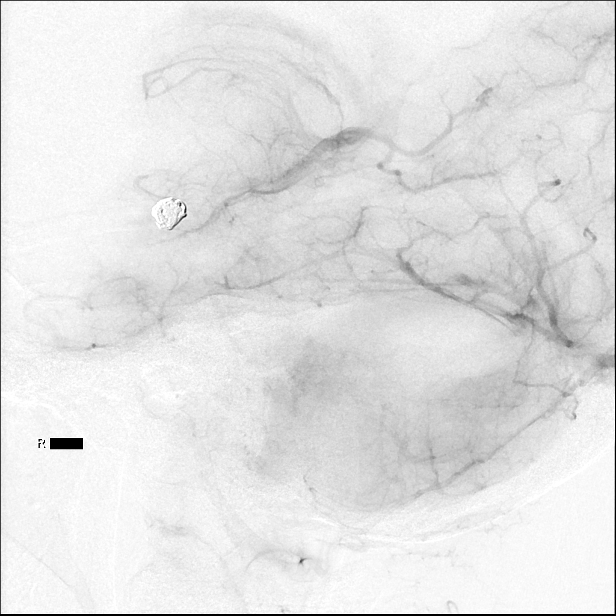

[10 of 24 positions shown; findings below may reference images not displayed]

EXAM:
IR NEURO EACH ADD'L AFTER BASIC UNI LEFT (MS) LEFT VERTEBRAL ARTERY
ARTERIOGRAM FOLLOWED BY ENDOVASCULAR TREATMENT OF THE NECK REMNANT
OF THE PREVIOUSLY COILED BASILAR APEX ANEURYSM.

ANESTHESIA/SEDATION:
General anesthesia.

MEDICATIONS:
As per general anesthesia.

CONTRAST:  50 OMNIPAQUE IOHEXOL 300 MG/ML SOLN, 78mL OMNIPAQUE
IOHEXOL 300 MG/ML SOLN

PROCEDURE:
Following a full explanation of the procedure along with the
potential associated complications, an informed witnessed consent
was obtained.

The patient was then put under general anesthesia by the [REDACTED] at [HOSPITAL].

The right groin was prepped and draped in the usual sterile fashion.
Thereafter using modified Seldinger technique transfemoral access
into the right common femoral artery was obtained without
difficulty. Over a 0.035 inch guidewire, a 5 French Pinnacle sheath
was inserted. Through this, and also over a 0.035 inch guidewire, a
5 French JB1 catheter was advanced to the aortic arch region and
selectively positioned in the right common carotid artery, the right
vertebral artery, the left common carotid artery and the left
vertebral artery.

The patient tolerated the procedure well.

COMPLICATIONS:
None immediate.
FINDINGS: The right common carotid arteriogram demonstrates the right external
carotid artery and its major branches to be normal.

The right internal carotid artery at the bulb to the cranial skull
base opacifies normally.

The petrous, the cavernous and the supraclinoid segments are widely
patent.

The right middle cerebral artery and the right anterior cerebral
artery opacify normally into the capillary and venous phases. The
right vertebral artery origin is normal.

The vessel is seen to opacify normally to the cranial skull base.
Normal opacification is seen of the right posterior inferior
cerebellar artery and the right vertebrobasilar junction.

The opacified portions of the basilar artery, the posterior cerebral
arteries, the superior cerebellar arteries and the anterior inferior
cerebellar arteries is normal into the capillary and venous phases.
Non-opacified blood is seen in the basilar artery from the
contralateral vertebral artery.

The left common carotid arteriogram demonstrates the left external
carotid artery and its major branches to be widely patent.

The left internal carotid artery at the bulb to the cranial skull
base opacifies normally.

The petrous, the cavernous and the supraclinoid segments are widely
patent. The left middle cerebral artery and the left anterior
cerebral artery are seen to opacify normally into the capillary and
venous phases.

The dominant left vertebral artery origin is normal.

The vessel is seen to opacify normally to the cranial skull base.
Normal opacification is seen of the left posterior inferior
cerebellar artery and of the left vertebrobasilar junction.

The opacified portion of the basilar artery, the posterior cerebral
arteries, the superior cerebellar arteries and the anterior inferior
cerebellar arteries is normal into the capillary and venous phases.
Again demonstrated is the previously treated basilar apex aneurysm
without evidence of coil compaction or recanalization.

Arising along the anterior aspect and projecting superiorly is a
saccular aneurysm measuring approximately 2.2 mm x 1.8 mm.

ENDOVASCULAR TREATMENT OF SMALL BASILAR APEX ANEURYSM ARISING JUST
ANTERIOR TO THE PREVIOUSLY TREATED BASILAR APEX ANEURYSM USING THE
LVIS STENT DEVICE.

The diagnostic JB1 catheter in the left subclavian artery was then
exchanged over a 0.035 inch 300 cm Rosen exchange guidewire for an
80 cm 6 Dibyaranjan Vijay using biplane roadmap technique
and constant fluoroscopic guidance. Good aspiration was obtained
from the side port at the hub of the 6 Dibyaranjan Vijay. A
gentle contrast injection demonstrated no evidence of spasms,
dissections or of intraluminal filling defects.

This was then connected to continuous heparinized saline infusion.

Over the Rosen exchange guidewire, a 5 French 115 cm Navien micro
guide catheter was then advanced and positioned at the origin of the
left vertebral artery.

The guidewire was removed. Good aspiration was obtained from the hub
of the 5 French Navien guide catheter. Using biplane roadmap
technique and constant fluoroscopic guidance, in a coaxial manner
and with constant heparinized saline infusion, over a 0.035 inch
Roadrunner guidewire, the 5 French Navien guide catheter was then
advanced to the distal cervical segment of the left vertebral
artery. The guidewire was removed. Good aspiration was obtained from
the hub of the 5 French guide catheter. A gentle contrast injection
demonstrated no evidence spasms, dissections or of intraluminal
filling defects.

At this time a Headway 17 2-tip micro catheter was then advanced
over a 0.014 inch Softip Synchro micro guidewire to the distal end
of the 5 French guide catheter. With the micro guidewire leading
with a J-tip configuration to avoid dissections or inducing spasm,
the combination was then navigated into the distal basilar artery.
The micro guidewire was then gently manipulated using a torque
device to access the left posterior cerebral artery into the P2 P3
region followed by the micro catheter. The guidewire was removed.
Good aspiration was obtained from the hub of the micro catheter. A
gentle contrast injection demonstrated antegrade flow. This micro
catheter was then connected to continuous heparinized saline
infusion. At this time at 2.5 mm x 28 mm Baxi Lance. stent with a
working length of 24 mm was then advanced in a coaxial manner and
with constant heparinized saline infusion using biplane roadmap
technique and constant fluoroscopic guidance to the distal end of
the micro catheter in the left posterior cerebral artery P2 P3
segment.

This entire system was then straightened. After having positioned
the landing zone of the Baxi Lance. stent, the O ring on the delivery
micro guidewire and the delivery micro catheter were then gently
released. With slight forward gentle traction with the right hand on
the delivery micro guidewire within the left hand the delivery micro
catheter was then retrieved gradually deliberately and slowly with
intermittent native fluoroscopic shots. The entire Baxi Lance. stent
was then delivered with excellent apposition and also distal and
proximal landing zones.

Control arteriograms were then performed at [DATE] and 40 minutes
after placement of the Baxi Lance. stent device.

Sluggish flow was seen in the small aneurysm.

The micro guidewire was then gently retrieved under constant
fluoroscopic guidance ensuring no sudden forward motion movements of
the stent device. This was then gently retrieved and removed.

The 5 French Navien guide catheter and the 6 Rodrigue Aime Fuhnwi
Eugene were then retrieved into the abdominal and exchanged over a
J-tip guidewire for a 6 French Pinnacle sheath which was then
successfully replaced with an external closure device.

Throughout the procedure the patient's ACT was maintained in the
region of approximately 180 seconds. No acute neurological or
hemodynamic changes were seen throughout the procedure. No
intraluminal filling defects, occlusions or extravasation was seen.

The patient's general anesthesia was reversed and the patient was
then extubated without difficulty. Upon recovery the patient
demonstrated no new neurological signs or symptoms.

The patient was then transported to the neuro ICU for overnight
observations to continue on IV heparin, tight control of her BP and
to continue neurologic observations.

The patient's overnight stay was unremarkable.

The following morning the IV heparin was stopped and the patient was
switched to aspirin 325 mg per day and Plavix 75 mg a day. The
patient was then discharged from the care of her husband the
following day to follow-up in the Outpatient Clinic in 2 weeks time.
The patient was advised to continue drinking plenty of fluids to
remain hydrated. She was asked to call should she have any concerns
or questions.
IMPRESSION: Status post endovascular treatment of a new small wide neck aneurysm
arising at the distal basilar apex as described above using the YUKIKO
Rebbestad. stent device.

## 2016-10-08 ENCOUNTER — Telehealth: Payer: Self-pay

## 2016-10-08 ENCOUNTER — Telehealth: Payer: Self-pay | Admitting: Internal Medicine

## 2016-10-09 ENCOUNTER — Other Ambulatory Visit (HOSPITAL_COMMUNITY): Payer: Self-pay | Admitting: Interventional Radiology

## 2016-10-09 DIAGNOSIS — I729 Aneurysm of unspecified site: Secondary | ICD-10-CM

## 2016-10-15 ENCOUNTER — Encounter: Payer: Self-pay | Admitting: Internal Medicine

## 2016-10-15 ENCOUNTER — Ambulatory Visit (INDEPENDENT_AMBULATORY_CARE_PROVIDER_SITE_OTHER): Payer: BLUE CROSS/BLUE SHIELD | Admitting: Internal Medicine

## 2016-10-15 VITALS — BP 104/72 | HR 67 | Temp 98.4°F | Resp 12 | Ht 62.0 in | Wt 131.2 lb

## 2016-10-15 DIAGNOSIS — Z0000 Encounter for general adult medical examination without abnormal findings: Secondary | ICD-10-CM

## 2016-10-15 DIAGNOSIS — Z23 Encounter for immunization: Secondary | ICD-10-CM | POA: Diagnosis not present

## 2016-10-15 DIAGNOSIS — K760 Fatty (change of) liver, not elsewhere classified: Secondary | ICD-10-CM | POA: Diagnosis not present

## 2016-10-15 DIAGNOSIS — Z87891 Personal history of nicotine dependence: Secondary | ICD-10-CM

## 2016-10-15 DIAGNOSIS — D126 Benign neoplasm of colon, unspecified: Secondary | ICD-10-CM

## 2016-10-15 DIAGNOSIS — K219 Gastro-esophageal reflux disease without esophagitis: Secondary | ICD-10-CM

## 2016-10-15 DIAGNOSIS — E7800 Pure hypercholesterolemia, unspecified: Secondary | ICD-10-CM

## 2016-10-15 DIAGNOSIS — I671 Cerebral aneurysm, nonruptured: Secondary | ICD-10-CM

## 2016-10-15 DIAGNOSIS — F418 Other specified anxiety disorders: Secondary | ICD-10-CM

## 2016-10-15 DIAGNOSIS — E785 Hyperlipidemia, unspecified: Secondary | ICD-10-CM

## 2016-10-15 MED ORDER — ALPRAZOLAM 1 MG PO TABS: 1.0000 mg | ORAL_TABLET | Freq: Every evening | ORAL | 5 refills | Status: DC | PRN

## 2016-10-15 NOTE — Progress Notes (Signed)
Pre-visit discussion using our clinic review tool. No additional management support is needed unless otherwise documented below in the visit note.  

## 2016-10-15 NOTE — Patient Instructions (Signed)

## 2016-10-15 NOTE — Progress Notes (Signed)
Patient ID: Katrina Woods, female    DOB: May 12, 1956  Age: 60 y.o. MRN: [id number]  The patient is here for annual Medicare wellness examination and management of other chronic and acute problems. Last seen a year ago for same. .   mammogmram  Normal  2016  Last PAP smear normal 2014, offered today but deferred.  Last colonoscopy  2013, 5 yr follow up advised.    The risk factors are reflected in the social history.  The roster of all physicians providing medical care to patient - is listed in the Snapshot section of the chart.  Home safety : The patient has smoke detectors in the home. They wear seatbelts.  There are no firearms at home. There is no violence in the home.   There is no risks for hepatitis, STDs or HIV. There is no   history of blood transfusion. They have no travel history to infectious disease endemic areas of the world.  The patient has seen their dentist in the last six month. They have seen their eye doctor in the last year. They admit to slight hearing difficulty with regard to whispered voices and some television programs.  They have deferred audiologic testing in the last year.  They do not  have excessive sun exposure. Discussed the need for sun protection: hats, long sleeves and use of sunscreen if there is significant sun exposure.   Diet: the importance of a healthy diet is discussed. They do have a healthy diet.  The benefits of regular aerobic exercise were discussed. She walks 4 times per week ,  20 minutes.   Depression screen: there are no signs or vegative symptoms of depression- irritability, change in appetite, anhedonia, sadness/tearfullness.   The following portions of the patient's history were reviewed and updated as appropriate: allergies, current medications, past family history, past medical history,  past surgical history, past social history  and problem list.  Visual acuity was not assessed per patient preference since she has regular  follow up with her ophthalmologist. Hearing and body mass index were assessed and reviewed.   During the course of the visit the patient was educated and counseled about appropriate screening and preventive services including : fall prevention , diabetes screening, nutrition counseling, colorectal cancer screening, and recommended immunizations.    CC: The primary encounter diagnosis was Fatty infiltration of liver. Diagnoses of Hyperlipidemia LDL goal <100, History of tobacco abuse, Gastroesophageal reflux disease without esophagitis, Pure hypercholesterolemia, Tubular adenoma of colon, Depression with anxiety, Aneurysm, cerebral, nonruptured, and Encounter for preventive health examination were also pertinent to this visit.   Anxiety:  She continues to shoulder the burden of caring for her 19 yr old father  Who lives alone  nearby , . Her two brothers do not share in the responsibility.  She has incurred increased financial burden in maintaining his separate residence.  She has hired a  To  CMA from local  To come in twice a week during the day to help with his hygiene and assist in domestic duties.    She is using Prn alprazolam to manage the anxiety she experiences related to her responsibilities as her father's caregiver ,  Her own health issues and her  financial uncertainty.  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   GERD:  Indigestion has improved/nearly resolved with better diet. Has lost  12 lbs since March . Skips meals occasionally,  Dinner .  Never breakfast or  lunch   History of cerebral aneurysm:  Had a callback by Deveshwar for a  repeat MRI brain with contrast .  MRI reviewed . Worried about the chronic microvascular ischemic changes and  Risk of dementia.        History Yasmine has a past medical history of Aneurysm ([place]); Back pain; Bursitis of left hip (8-13); Depression; GERD (gastroesophageal reflux disease); Headache; Hiatal hernia (209);  History of bronchitis (2015); History of colon polyps; History of kidney stones; History of shingles; Hyperlipidemia; Insomnia; and Weakness.   She has a past surgical history that includes Abdominal hysterectomy (2004); Knee cartilage surgery (Left, 1987); Cesarean section (11-25-82); Wisdom tooth extraction (1986); Cerebral aneurysm repair (2006); Colonoscopy; Esophagogastroduodenoscopy; and Radiology with anesthesia (N/A, 05/11/2015).   Her family history includes Appendicitis in her father; Cancer in her father, maternal grandmother, and mother; Diabetes in her maternal grandmother and mother; Diabetes (age of onset: 10) in her brother; Heart block in her paternal grandfather; Heart disease in her maternal grandmother and mother; Hypertension in her brother, maternal grandmother, and mother; Stroke in her maternal grandfather.She reports that she has quit smoking. Her smoking use included Cigarettes. She has never used smokeless tobacco. She reports that she drinks about 1.8 oz of alcohol per week . She reports that she does not use drugs.  Outpatient Medications Prior to Visit  Medication Sig Dispense Refill  . clopidogrel (PLAVIX) 75 MG tablet Take 1 tablet (75 mg total) by mouth daily. 30 tablet 5  . clorazepate (TRANXENE) 7.5 MG tablet Take 3.75 mg by mouth 2 (two) times daily as needed (for muscle spasms).     Marland Kitchen ibuprofen (ADVIL,MOTRIN) 200 MG tablet Take 800 mg by mouth every 6 (six) hours as needed for headache.    Marland Kitchen omeprazole (PRILOSEC) 40 MG capsule TAKE 1 CAPSULE (40 MG TOTAL) BY MOUTH DAILY. 30 capsule 10  . valACYclovir (VALTREX) 1000 MG tablet Take 1 tablet (1,000 mg total) by mouth 3 (three) times daily. 21 tablet 0  . ALPRAZolam (XANAX) 1 MG tablet TAKE 1 TABLET BY MOUTH AT BEDTIME for insomnia (Patient taking differently: Take 1 mg by mouth at bedtime as needed for sleep. TAKE 1 TABLET BY MOUTH AT BEDTIME for insomnia) 30 tablet 5  . CHANTIX 1 MG tablet TAKE 1 TABLET TWICE A DAY  (Patient not taking: Reported on 10/15/2016) 60 tablet 3  . escitalopram (LEXAPRO) 5 MG tablet TAKE 1 TABLET (5 MG TOTAL) BY MOUTH DAILY. (Patient not taking: Reported on 10/15/2016) 90 tablet 0  . famotidine (PEPCID) 20 MG tablet Take 1 tablet (20 mg total) by mouth 2 (two) times daily. (Patient not taking: Reported on 10/15/2016) 60 tablet 5  . gabapentin (NEURONTIN) 300 MG capsule Take 300-600 mg by mouth 3 (three) times daily. 600 mg in the morning, 300 mg at noon, 300 mg in the afternoon around 1600, and 600 mg at bedtime    . HYDROcodone-acetaminophen (NORCO/VICODIN) 5-325 MG tablet Take 1 tablet by mouth every 8 (eight) hours as needed for moderate pain. (Patient not taking: Reported on 10/15/2016) 20 tablet 0  . rosuvastatin (CRESTOR) 20 MG tablet TAKE 1 TABLET (20 MG TOTAL) BY MOUTH DAILY. (Patient not taking: Reported on 10/15/2016) 30 tablet 3   No facility-administered medications prior to visit.     Review of Systems   Patient denies headache, fevers, malaise, unintentional weight loss, skin rash, eye pain, sinus congestion and sinus pain, sore throat, dysphagia,  hemoptysis , cough, dyspnea, wheezing, chest pain, palpitations,  orthopnea, edema, abdominal pain, nausea, melena, diarrhea, constipation, flank pain, dysuria, hematuria, urinary  Frequency, nocturia, numbness, tingling, seizures,  Focal weakness, Loss of consciousness,  Tremor, insomnia, depression, anxiety, and suicidal ideation.      Objective:  BP 104/72   Pulse 67   Temp 98.4 F (36.9 C) (Oral)   Resp 12   Ht 5\' 2"  (1.575 m)   Wt 131 lb 4 oz (59.5 kg)   SpO2 96%   BMI 24.01 kg/m   Physical Exam   General appearance: alert, cooperative and appears stated age Head: Normocephalic, without obvious abnormality, atraumatic Eyes: conjunctivae/corneas clear. PERRL, EOM's intact. Fundi benign. Ears: normal TM's and external ear canals both ears Nose: Nares normal. Septum midline. Mucosa normal. No drainage or sinus  tenderness. Throat: lips, mucosa, and tongue normal; teeth and gums normal Neck: no adenopathy, no carotid bruit, no JVD, supple, symmetrical, trachea midline and thyroid not enlarged, symmetric, no tenderness/mass/nodules Lungs: clear to auscultation bilaterally Breasts: normal appearance, no masses or tenderness Heart: regular rate and rhythm, S1, S2 normal, no murmur, click, rub or gallop Abdomen: soft, non-tender; bowel sounds normal; no masses,  no organomegaly Extremities: extremities normal, atraumatic, no cyanosis or edema Pulses: 2+ and symmetric Skin: Skin color, texture, turgor normal. No rashes or lesions Neurologic: Alert and oriented X 3, normal strength and tone. Normal symmetric reflexes. Normal coordination and gait.   Psych: affect normal, makes good eye contact. No fidgeting,  Smiles easily.  Denies suicidal thoughts     Assessment & Plan:   Problem List Items Addressed This Visit    Hyperlipidemia    Managed with diet due to statin intolerance, LDL was 163 in 2016  Repeat is due   Lab Results  Component Value Date   CHOL 251 (H) 10/03/2015   HDL 61.20 10/03/2015   LDLCALC 162 (H) 10/03/2015   LDLDIRECT 164.9 05/01/2013   TRIG 137.0 10/03/2015   CHOLHDL 4 10/03/2015         Aneurysm, cerebral, nonruptured    Managed by Dr Estanislado Pandy with periodic MRI/MRA surveilance.       History of tobacco abuse    Quit in Nov 2015.        Depression with anxiety    Extended discussion of  Preferred management without medications desired by patient.  She has s handling the stressors with prn use of alprazolamfor insomnia primarily.  .The risks and benefits of benzodiazepine use were reviewed with patient today including excessive sedation leading to respiratory depression,  impaired thinking/driving, and addiction.  Patient was advised to avoid concurrent use with alcohol, to use medication only as needed and not to share with others  .       GERD (gastroesophageal  reflux disease)    Previously noted symptoms prompting EGD in 2013 have Improved with better diet. Discussed current controversy regarding prolonged use of PPI in patients without documented Barretts esophagus.  Suggested trial of pepcid 20 mg for prn use       Tubular adenoma of colon    She is due in 2018 given history of tubular adenoma in 2013.       Encounter for preventive health examination    Annual comprehensive preventive exam was done as well as an evaluation and management of chronic conditions .  During the course of the visit the patient was educated and counseled about appropriate screening and preventive services including :  diabetes screening, lipid analysis with projected  10 year  risk for  CAD , nutrition counseling, breast, cervical and colorectal cancer screening, and recommended immunizations.  Printed recommendations for health maintenance screenings was given      Fatty infiltration of liver - Primary    Suggested by prior imaging on CT abdomen done in 2014 to follow up on stable pancreatic hypodense lesion . Patient has been lost to follow up and has not had serologies for auotimmune diseases.  If liver enyzmes are normal,  no further workup, as she has moidified her llifestyle to include tobacco cessation,  Low GI diet and regular exercise.       Relevant Orders   Comprehensive metabolic panel    Other Visit Diagnoses    Hyperlipidemia LDL goal <100       Relevant Orders   Lipid panel      I have discontinued Ms. Forti's gabapentin, HYDROcodone-acetaminophen, famotidine, rosuvastatin, CHANTIX, and escitalopram. I have also changed her ALPRAZolam. Additionally, I am having her maintain her omeprazole, ibuprofen, clorazepate, valACYclovir, and clopidogrel.  Meds ordered this encounter  Medications  . ALPRAZolam (XANAX) 1 MG tablet    Sig: Take 1 tablet (1 mg total) by mouth at bedtime as needed for sleep. TAKE 1 TABLET BY MOUTH AT BEDTIME for insomnia     Dispense:  30 tablet    Refill:  5    May Refill now    Medications Discontinued During This Encounter  Medication Reason  . rosuvastatin (CRESTOR) 20 MG tablet   . HYDROcodone-acetaminophen (NORCO/VICODIN) 5-325 MG tablet   . famotidine (PEPCID) 20 MG tablet   . gabapentin (NEURONTIN) 300 MG capsule   . CHANTIX 1 MG tablet   . ALPRAZolam (XANAX) 1 MG tablet Reorder  . escitalopram (LEXAPRO) 5 MG tablet     Follow-up: Return in about 6 months (around 04/14/2017).   Crecencio Mc, MD

## 2016-10-16 ENCOUNTER — Encounter: Payer: Self-pay | Admitting: Internal Medicine

## 2016-10-16 NOTE — Assessment & Plan Note (Signed)
Annual comprehensive preventive exam was done as well as an evaluation and management of chronic conditions .  During the course of the visit the patient was educated and counseled about appropriate screening and preventive services including :  diabetes screening, lipid analysis with projected  10 year  risk for CAD , nutrition counseling, breast, cervical and colorectal cancer screening, and recommended immunizations.  Printed recommendations for health maintenance screenings was given 

## 2016-10-16 NOTE — Assessment & Plan Note (Addendum)
She is due in 2018 given history of tubular adenoma in 2013.

## 2016-10-16 NOTE — Assessment & Plan Note (Addendum)
Extended discussion of  Preferred management without medications desired by patient.  She has s handling the stressors with prn use of alprazolamfor insomnia primarily.  .The risks and benefits of benzodiazepine use were reviewed with patient today including excessive sedation leading to respiratory depression,  impaired thinking/driving, and addiction.  Patient was advised to avoid concurrent use with alcohol, to use medication only as needed and not to share with others  .

## 2016-10-16 NOTE — Assessment & Plan Note (Signed)
Quit in Nov 2015.

## 2016-10-16 NOTE — Assessment & Plan Note (Signed)
Managed by Dr Estanislado Pandy with periodic MRI/MRA surveilance.

## 2016-10-16 NOTE — Assessment & Plan Note (Signed)
Managed with diet due to statin intolerance, LDL was 163 in 2016  Repeat is due   Lab Results  Component Value Date   CHOL 251 (H) 10/03/2015   HDL 61.20 10/03/2015   LDLCALC 162 (H) 10/03/2015   LDLDIRECT 164.9 05/01/2013   TRIG 137.0 10/03/2015   CHOLHDL 4 10/03/2015

## 2016-10-16 NOTE — Assessment & Plan Note (Addendum)
Suggested by prior imaging on CT abdomen done in 2014 to follow up on stable pancreatic hypodense lesion . Patient has been lost to follow up and has not had serologies for auotimmune diseases.  If liver enyzmes are normal,  no further workup, as she has moidified her llifestyle to include tobacco cessation,  Low GI diet and regular exercise.

## 2016-10-16 NOTE — Assessment & Plan Note (Addendum)
Previously noted symptoms prompting EGD in 2013 have Improved with better diet. Discussed current controversy regarding prolonged use of PPI in patients without documented Barretts esophagus.  Suggested trial of pepcid 20 mg for prn use

## 2016-10-23 ENCOUNTER — Ambulatory Visit (HOSPITAL_COMMUNITY)
Admission: RE | Admit: 2016-10-23 | Discharge: 2016-10-23 | Disposition: A | Discharge status: Home / Self Care | Payer: BLUE CROSS/BLUE SHIELD | Source: Ambulatory Visit | Attending: Interventional Radiology | Admitting: Interventional Radiology

## 2016-10-23 DIAGNOSIS — I729 Aneurysm of unspecified site: Secondary | ICD-10-CM

## 2016-10-23 HISTORY — PX: IR GENERIC HISTORICAL: IMG1180011

## 2016-10-24 ENCOUNTER — Encounter (HOSPITAL_COMMUNITY): Payer: Self-pay | Admitting: Interventional Radiology

## 2016-10-31 ENCOUNTER — Other Ambulatory Visit (INDEPENDENT_AMBULATORY_CARE_PROVIDER_SITE_OTHER): Payer: BLUE CROSS/BLUE SHIELD

## 2016-10-31 DIAGNOSIS — E785 Hyperlipidemia, unspecified: Secondary | ICD-10-CM

## 2016-10-31 DIAGNOSIS — K760 Fatty (change of) liver, not elsewhere classified: Secondary | ICD-10-CM

## 2016-11-01 LAB — COMPREHENSIVE METABOLIC PANEL
ALT: 6 U/L (ref 6–29)
AST: 16 U/L (ref 10–35)
Albumin: 4.5 g/dL (ref 3.6–5.1)
Alkaline Phosphatase: 56 U/L (ref 33–130)
BUN: 18 mg/dL (ref 7–25)
CHLORIDE: 104 mmol/L (ref 98–110)
CO2: 27 mmol/L (ref 20–31)
Calcium: 9.9 mg/dL (ref 8.6–10.4)
Creat: 0.85 mg/dL (ref 0.50–0.99)
GLUCOSE: 76 mg/dL (ref 65–99)
POTASSIUM: 4.4 mmol/L (ref 3.5–5.3)
Sodium: 139 mmol/L (ref 135–146)
TOTAL PROTEIN: 7.3 g/dL (ref 6.1–8.1)
Total Bilirubin: 0.5 mg/dL (ref 0.2–1.2)

## 2016-11-01 LAB — LIPID PANEL
CHOL/HDL RATIO: 3.9 ratio (ref <5.0)
CHOLESTEROL: 258 mg/dL — AB (ref <200)
HDL: 67 mg/dL (ref >50)
LDL CALC: 162 mg/dL — AB (ref <100)
Triglycerides: 147 mg/dL (ref <150)
VLDL: 29 mg/dL (ref <30)

## 2016-11-05 ENCOUNTER — Encounter: Payer: Self-pay | Admitting: Internal Medicine

## 2016-12-17 ENCOUNTER — Encounter: Payer: Self-pay | Admitting: Internal Medicine

## 2016-12-17 ENCOUNTER — Telehealth: Payer: Self-pay | Admitting: *Deleted

## 2016-12-17 NOTE — Telephone Encounter (Signed)
The earliest appointment would be  11.30 on Friday. Patient requesting to see PCP,

## 2016-12-17 NOTE — Telephone Encounter (Signed)
Pt called back returning your call. Thank you! °

## 2016-12-17 NOTE — Telephone Encounter (Signed)
The earliest I can do is 1:30 on Thursday , because of the weather and the cancellations that have to be rebooked. You'll have to get Juliann Pulse or Lorriane Shire to add to my closed schedule

## 2016-12-17 NOTE — Telephone Encounter (Signed)
Patient has requested to be seen by Dr.Tullo only. Patient currently has back pain from finding her father unresponsive and having to pick him up from the floor. Patient is dealing with  great stress from her lost and requested to be seen asap by Dr.Tullo. Please Give a time and date to place pt .  Pt contact [phone]

## 2016-12-17 NOTE — Telephone Encounter (Signed)
Patient advised of below.  Will you please add to schedule ? Thanks.

## 2016-12-18 ENCOUNTER — Other Ambulatory Visit: Payer: Self-pay | Admitting: Internal Medicine

## 2016-12-18 ENCOUNTER — Telehealth: Payer: Self-pay | Admitting: Internal Medicine

## 2016-12-18 MED ORDER — DIAZEPAM 10 MG PO TABS: 10.0000 mg | ORAL_TABLET | Freq: Two times a day (BID) | ORAL | 1 refills | Status: DC | PRN

## 2016-12-18 NOTE — Telephone Encounter (Signed)
Valium 10 mg ready to be sign

## 2016-12-18 NOTE — Telephone Encounter (Signed)
Please print the valium rx and have ready for signing to fax,  See mychart

## 2016-12-20 ENCOUNTER — Ambulatory Visit (INDEPENDENT_AMBULATORY_CARE_PROVIDER_SITE_OTHER): Payer: 59 | Admitting: Internal Medicine

## 2016-12-20 ENCOUNTER — Encounter: Payer: Self-pay | Admitting: Internal Medicine

## 2016-12-20 VITALS — BP 128/82 | HR 73 | Temp 97.8°F | Resp 16 | Ht 62.0 in | Wt 132.4 lb

## 2016-12-20 DIAGNOSIS — S39012A Strain of muscle, fascia and tendon of lower back, initial encounter: Secondary | ICD-10-CM | POA: Diagnosis not present

## 2016-12-20 DIAGNOSIS — F4329 Adjustment disorder with other symptoms: Secondary | ICD-10-CM | POA: Diagnosis not present

## 2016-12-20 DIAGNOSIS — F418 Other specified anxiety disorders: Secondary | ICD-10-CM

## 2016-12-20 DIAGNOSIS — I671 Cerebral aneurysm, nonruptured: Secondary | ICD-10-CM

## 2016-12-20 DIAGNOSIS — F4321 Adjustment disorder with depressed mood: Secondary | ICD-10-CM

## 2016-12-20 DIAGNOSIS — Z634 Disappearance and death of family member: Secondary | ICD-10-CM

## 2016-12-20 MED ORDER — METAXALONE 800 MG PO TABS: 800.0000 mg | ORAL_TABLET | Freq: Three times a day (TID) | ORAL | 1 refills | Status: DC

## 2016-12-20 NOTE — Progress Notes (Signed)
Subjective:  Patient ID: Katrina Woods, female    DOB: February 27, 1956  Age: 61 y.o. MRN: [id number]  CC: The primary encounter diagnosis was Complicated grief. Diagnoses of Strain of lumbar paraspinal muscle, initial encounter, Depression with anxiety, and Aneurysm, cerebral, nonruptured were also pertinent to this visit.  HPI Katrina Woods presents for managed of low back pain and grief with uncontrolled anxiety following the witnessed death of her father. After finding him in his usually state, she returned to his bedside with lunch that she had just prepared and  found him unresponsive.    Despite his age of 29,  She called 911 and followed instructions to perform CPR, which required her to drag him out of bed and place him on the floor.  She felt his ribs break during the performance of CPR and was unable to revive him.     She is distraught and feeling guilt because she feels that she caused him pain during his last moments of life and cannot sleep or concentrate on anything.  Her Brother has been overly directive and domineering about the management of the estate bc he is the executor of her father's will.  She is worried that he will subvert his father's intentions to give her his home,  Since she has been his primary caregiver for years.   She has been havingsevere  lower back pain in the midline that radiates to the right SI area and buttock but not below.  She thinks her right leg has become weak.  But denies stumbling and foot drop. The pain is aggravated by using stairs. .  Has been taking advil 800 mg and skelaxin   Twice daily hich have provided transeint relief.  She is taking a  Full xanax at night,  But still having difficulty initiating and maintining sleep .  She has taken to pacing the floors.    Outpatient Medications Prior to Visit  Medication Sig Dispense Refill  . ALPRAZolam (XANAX) 1 MG tablet Take 1 tablet (1 mg total) by mouth at bedtime as needed for  sleep. TAKE 1 TABLET BY MOUTH AT BEDTIME for insomnia 30 tablet 5  . diazepam (VALIUM) 10 MG tablet Take 1 tablet (10 mg total) by mouth every 12 (twelve) hours as needed for anxiety. 30 tablet 1  . ibuprofen (ADVIL,MOTRIN) 200 MG tablet Take 800 mg by mouth every 6 (six) hours as needed for headache.    . valACYclovir (VALTREX) 1000 MG tablet Take 1 tablet (1,000 mg total) by mouth 3 (three) times daily. 21 tablet 0  . clopidogrel (PLAVIX) 75 MG tablet Take 1 tablet (75 mg total) by mouth daily. 30 tablet 5  . clorazepate (TRANXENE) 7.5 MG tablet Take 3.75 mg by mouth 2 (two) times daily as needed (for muscle spasms).     Marland Kitchen omeprazole (PRILOSEC) 40 MG capsule TAKE 1 CAPSULE (40 MG TOTAL) BY MOUTH DAILY. 30 capsule 10   No facility-administered medications prior to visit.     Review of Systems;  Patient denies headache, fevers, malaise, unintentional weight loss, skin rash, eye pain, sinus congestion and sinus pain, sore throat, dysphagia,  hemoptysis , cough, dyspnea, wheezing, chest pain, palpitations, orthopnea, edema, abdominal pain, nausea, melena, diarrhea, constipation, flank pain, dysuria, hematuria, urinary  Frequency, nocturia, numbness, tingling, seizures,  Focal weakness, Loss of consciousness,  Tremor, insomnia, depression, anxiety, and suicidal ideation.      Objective:  BP 128/82   Pulse 73   Temp 97.8 F (  36.6 C) (Oral)   Resp 16   Ht 5\' 2"  (1.575 m)   Wt 132 lb 6 oz (60 kg)   SpO2 99%   BMI 24.21 kg/m   BP Readings from Last 3 Encounters:  12/20/16 128/82  10/15/16 104/72  03/02/16 112/62    Wt Readings from Last 3 Encounters:  12/20/16 132 lb 6 oz (60 kg)  10/15/16 131 lb 4 oz (59.5 kg)  03/02/16 143 lb (64.9 kg)    General appearance: alert, cooperative and appears stated age Ears: normal TM's and external ear canals both ears Throat: lips, mucosa, and tongue normal; teeth and gums normal Neck: no adenopathy, no carotid bruit, supple, symmetrical,  trachea midline and thyroid not enlarged, symmetric, no tenderness/mass/nodules Back: symmetric, no curvature. ROM normal. No CVA tenderness. Lungs: clear to auscultation bilaterally Heart: regular rate and rhythm, S1, S2 normal, no murmur, click, rub or gallop Abdomen: soft, non-tender; bowel sounds normal; no masses,  no organomegaly Pulses: 2+ and symmetric Skin: Skin color, texture, turgor normal. No rashes or lesions Lymph nodes: Cervical, supraclavicular, and axillary nodes normal.  No results found for: HGBA1C  Lab Results  Component Value Date   CREATININE 0.85 10/31/2016   CREATININE 0.76 09/13/2016   CREATININE 0.87 01/27/2016    Lab Results  Component Value Date   WBC 7.1 01/27/2016   HGB 12.7 01/27/2016   HCT 37.7 01/27/2016   PLT 154 01/27/2016   GLUCOSE 76 10/31/2016   CHOL 258 (H) 10/31/2016   TRIG 147 10/31/2016   HDL 67 10/31/2016   LDLDIRECT 164.9 05/01/2013   LDLCALC 162 (H) 10/31/2016   ALT 6 10/31/2016   AST 16 10/31/2016   NA 139 10/31/2016   K 4.4 10/31/2016   CL 104 10/31/2016   CREATININE 0.85 10/31/2016   BUN 18 10/31/2016   CO2 27 10/31/2016   TSH 3.11 07/21/2014   INR 0.99 01/27/2016    Ir Radiologist Eval & Mgmt  Result Date: 10/24/2016 EXAM: NEW PATIENT OFFICE VISIT CHIEF COMPLAINT: No specific complaints. The patient is a 61 year old right-handed lady who presents in follow-up subsequent to a recent MRI of the brain and MRA of the brain. 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 The patient presents today essentially devoid of any symptoms of headaches, nausea or vomiting, photophobia, visual aberrations, motor, sensory, coordination or station and gait abnormalities. She denies any symptoms of loss of consciousness or seizures. Since her last visit the patient has stop smoking. Additionally, she has joined yoga and is also a Medical sales representative. She is coping with her job. Denies any other specific pathologic symptomatology pertainable to her review of systems. Present medications: Aspirin 81 mg a day and Plavix 75 mg half tablet once a day. Allergies:  She is allergic to statins. Her most recent MRI MRA of the brain was reviewed with her. Brought to her attention was the approximately 2.2 mm focal area of flow signal at the base of the previously treated aneurysm. The remaining aneurysms which are extracranial remain stable. The patient was informed that this may represent minimal recanalization of the neck of the treated aneurysm, versus a vascular loop. Given the small size and also the lack of symptoms, it was suggested that the patient undergo a follow-up MRI of the brain and MRA of the brain in approximately 6-8 months. Should she develop new symptoms of worsening headaches, she has been asked  to call sooner. Also the patient has been advised to stop her Plavix but to continue on aspirin 81 mg once a day. Questions were answered to her satisfaction. The patient leaves with good understanding and agreement with the above management plan. Current Pain Level: 1-10 HISTORY OF PRESENT ILLNESS: As above. PHYSICAL EXAMINATION: Nonfocal. ASSESSMENT AND PLAN: As above. Electronically Signed   By: Luanne Bras M.D.   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  Surveillance of  Aneurysm is managed by Dr Estanislado Pandy with periodic MRI/MRA surveillance.  No recent recurrence of headaches         Depression with anxiety    Adding valium twice daily,  alprpzolam suspended,  grief counselling recommended and referral to [employer] in process      Strain of lumbar paraspinal muscle, initial encounter    Continue NSAIDs,  Add muscle relaxer ,  Heat/ice        Other Visit Diagnoses     Complicated grief    -  Primary   Relevant Orders   Ambulatory referral to Psychology      I have discontinued Ms. Meth's omeprazole, clorazepate, and clopidogrel. I have also changed her metaxalone. Additionally, I am having her maintain her ibuprofen, valACYclovir, ALPRAZolam, and diazepam.  Meds ordered this encounter  Medications  . DISCONTD: metaxalone (SKELAXIN) 800 MG tablet    Sig: Take 1 tablet by mouth 3 (three) times daily.  . metaxalone (SKELAXIN) 800 MG tablet    Sig: Take 1 tablet (800 mg total) by mouth 3 (three) times daily.    Dispense:  90 tablet    Refill:  1    Medications Discontinued During This Encounter  Medication Reason  . clopidogrel (PLAVIX) 75 MG tablet Discontinued by provider  . clorazepate (TRANXENE) 7.5 MG tablet Patient has not taken in last 30 days  . omeprazole (PRILOSEC) 40 MG capsule Patient has not taken in last 30 days  . metaxalone (SKELAXIN) 800 MG tablet Reorder    Follow-up: Return in about 4 weeks (around 01/17/2017).   Crecencio Mc, MD

## 2016-12-20 NOTE — Progress Notes (Signed)
Pre-visit discussion using our clinic review tool. No additional management support is needed unless otherwise documented below in the visit note.  

## 2016-12-20 NOTE — Patient Instructions (Addendum)
You strained your back while ifting you father,  So you need to use anti inflammatories ,  Rest and muslce relaxers>  Increase the advil to three times daily   suspend the alprazolam and use the valium,  The dose is  5 to 10  Mg twice daily.  Ok to add the skelaxin  If needed for muscle pain   Ok to use ice and heat for 15 minutes every 3-4 hours as needed  I highly recommend Trey Paula for grief counselling and have iniated the referral , so you can call her to set that up

## 2016-12-23 NOTE — Assessment & Plan Note (Signed)
Adding valium twice daily,  alprpzolam suspended,  grief counselling recommended and referral to [employer] in process

## 2016-12-23 NOTE — Assessment & Plan Note (Addendum)
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  Surveillance of  Aneurysm is managed by Dr Estanislado Pandy with periodic MRI/MRA surveillance.  No recent recurrence of headaches

## 2016-12-23 NOTE — Assessment & Plan Note (Signed)
Continue NSAIDs,  Add muscle relaxer ,  Heat/ice

## 2017-01-15 NOTE — Telephone Encounter (Signed)
error 

## 2017-01-23 ENCOUNTER — Ambulatory Visit: Payer: 59 | Admitting: Internal Medicine

## 2017-03-05 ENCOUNTER — Encounter: Payer: Self-pay | Admitting: Internal Medicine

## 2017-03-07 ENCOUNTER — Other Ambulatory Visit: Payer: Self-pay | Admitting: Internal Medicine

## 2017-03-07 MED ORDER — ALPRAZOLAM 1 MG PO TABS
1.0000 mg | ORAL_TABLET | Freq: Every evening | ORAL | 0 refills | Status: DC | PRN
Start: 2017-03-07 — End: 2017-04-16

## 2017-03-11 ENCOUNTER — Other Ambulatory Visit: Payer: Self-pay | Admitting: Internal Medicine

## 2017-03-11 MED ORDER — ESCITALOPRAM OXALATE 10 MG PO TABS: 10.0000 mg | ORAL_TABLET | Freq: Every day | ORAL | 1 refills | Status: DC

## 2017-03-12 ENCOUNTER — Emergency Department
Admission: EM | Admit: 2017-03-12 | Discharge: 2017-03-12 | Disposition: A | Discharge status: Home / Self Care | Payer: 59 | Attending: Student in an Organized Health Care Education/Training Program | Admitting: Student in an Organized Health Care Education/Training Program

## 2017-03-12 ENCOUNTER — Encounter: Payer: Self-pay | Admitting: Emergency Medicine

## 2017-03-12 ENCOUNTER — Emergency Department: Payer: 59

## 2017-03-12 DIAGNOSIS — J40 Bronchitis, not specified as acute or chronic: Secondary | ICD-10-CM | POA: Diagnosis not present

## 2017-03-12 DIAGNOSIS — R079 Chest pain, unspecified: Secondary | ICD-10-CM

## 2017-03-12 DIAGNOSIS — I10 Essential (primary) hypertension: Secondary | ICD-10-CM | POA: Diagnosis not present

## 2017-03-12 DIAGNOSIS — Z87891 Personal history of nicotine dependence: Secondary | ICD-10-CM | POA: Insufficient documentation

## 2017-03-12 DIAGNOSIS — E876 Hypokalemia: Secondary | ICD-10-CM | POA: Diagnosis not present

## 2017-03-12 DIAGNOSIS — R002 Palpitations: Secondary | ICD-10-CM | POA: Diagnosis not present

## 2017-03-12 DIAGNOSIS — R42 Dizziness and giddiness: Secondary | ICD-10-CM | POA: Diagnosis not present

## 2017-03-12 LAB — BASIC METABOLIC PANEL
ANION GAP: 6 (ref 5–15)
BUN: 15 mg/dL (ref 6–20)
CALCIUM: 9.3 mg/dL (ref 8.9–10.3)
CHLORIDE: 102 mmol/L (ref 101–111)
CO2: 26 mmol/L (ref 22–32)
Creatinine, Ser: 0.65 mg/dL (ref 0.44–1.00)
GFR calc Af Amer: >60 mL/min (ref >60)
GFR calc non Af Amer: >60 mL/min (ref >60)
GLUCOSE: 90 mg/dL (ref 65–99)
Potassium: 4.2 mmol/L (ref 3.5–5.1)
Sodium: 134 mmol/L — ABNORMAL LOW (ref 135–145)

## 2017-03-12 LAB — CBC
HEMATOCRIT: 39.7 % (ref 35.0–47.0)
HEMOGLOBIN: 13.6 g/dL (ref 12.0–16.0)
MCH: 31.4 pg (ref 26.0–34.0)
MCHC: 34.3 g/dL (ref 32.0–36.0)
MCV: 91.7 fL (ref 80.0–100.0)
Platelets: 234 10*3/uL (ref 150–440)
RBC: 4.33 MIL/uL (ref 3.80–5.20)
RDW: 13.5 % (ref 11.5–14.5)
WBC: 6.1 10*3/uL (ref 3.6–11.0)

## 2017-03-12 LAB — TROPONIN I

## 2017-03-12 MED ORDER — PREDNISONE 20 MG PO TABS: 40.0000 mg | ORAL_TABLET | Freq: Every day | ORAL | 0 refills | Status: AC

## 2017-03-12 MED ORDER — ALBUTEROL SULFATE HFA 108 (90 BASE) MCG/ACT IN AERS: 2.0000 | INHALATION_SPRAY | Freq: Four times a day (QID) | RESPIRATORY_TRACT | 2 refills | Status: DC | PRN

## 2017-03-12 NOTE — ED Triage Notes (Signed)
Patient presents to the ED with chest pain since Friday with intermittent severity, occasional dizziness, and nausea.  Patient reports that Friday-Sunday she had an occasional fever but has not had a fever since.  Patient was sent to the ED from the [facility].  Patient is in no obvious distress at this time.

## 2017-03-12 NOTE — ED Provider Notes (Signed)
[facility] Emergency Department Provider Note    First MD Initiated Contact with Patient 03/12/17 1715     (approximate)  I have reviewed the triage vital signs and the nursing notes.   HISTORY  Chief Complaint Chest Pain    HPI Katrina Woods is a 61 y.o. female presents for evaluation of intermittent chest pain since Friday. Symptoms are associated with occasional dizziness and nausea. Patient states that she had a viral risk for infection starting Friday was having chills. States that the fever broke over the weekend. Since then she's otherwise been doing well. She went to work today and states that she "just didn't feel right" she [facility] that she was picking up some anxiety medications prescribed by her PCP. She checked her blood pressure which was elevated. She then checked and the minute clinic and started describing summer symptoms and as they're unable to perform any diagnostic testing they sent her to the ER to be further evaluated. On arrival to the ER patient denies any chest pain or shortness of breath. Denies any nausea or vomiting.   Past Medical History:  Diagnosis Date  . Aneurysm ([place])    cerebral  . Back pain   . Bursitis of left hip 8-13   takes Meloxicam daily   . Depression    takes Lexapro daily  . GERD (gastroesophageal reflux disease)    takes Omeprazole daily  . Headache   . Hiatal hernia 209  . History of bronchitis 2015  . History of colon polyps    benign  . History of kidney stones   . History of shingles   . Hyperlipidemia    takes Atorvastatin daily  . Insomnia    takes Xanax nightly  . Weakness    numbness and tingling-thinks its carpal tunnel   Family History  Problem Relation Age of Onset  . Diabetes Mother     type 2  . Hypertension Mother   . Heart disease Mother     CHF  . Cancer Mother     lung- previous smoker- had quit 12 yrs before  . Appendicitis Father    acute  . Cancer Father     prostate?  . Diabetes Brother 12    type 2  . Hypertension Brother   . Cancer Maternal Grandmother     breast, uterine  . Hypertension Maternal Grandmother   . Heart disease Maternal Grandmother     CHF  . Diabetes Maternal Grandmother   . Stroke Maternal Grandfather   . Heart block Paternal Grandfather     massive  . Colon cancer Neg Hx   . Stomach cancer Neg Hx    Past Surgical History:  Procedure Laterality Date  . ABDOMINAL HYSTERECTOMY  2004   total  . CEREBRAL ANEURYSM REPAIR  2006   Deveshwar, coil   . CESAREAN SECTION  11-25-82  . COLONOSCOPY    . ESOPHAGOGASTRODUODENOSCOPY    . IR GENERIC HISTORICAL  10/23/2016   IR RADIOLOGIST EVAL & MGMT 10/23/2016 [facility]INTER[facility] RAD  . KNEE CARTILAGE SURGERY Left 1987  . RADIOLOGY WITH ANESTHESIA N/A 05/11/2015   Procedure: ANEURYSM EMBOLIZATION;  Surgeon: Luanne Bras, MD;  Location: [place];  Service: Radiology;  Laterality: N/A;  . [place]   all four extracted   Patient Active Problem List   Diagnosis Date Noted  . Strain of lumbar paraspinal muscle, initial encounter 12/20/2016  . Shingles 03/02/2016  .  Statin intolerance 10/11/2013  . Fatty infiltration of liver 10/11/2013  . Encounter for preventive health examination 04/17/2013  . Tubular adenoma of colon 12/03/2012  . GERD (gastroesophageal reflux disease) 10/16/2012  . History of tobacco abuse 09/12/2012  . Depression with anxiety 09/12/2012  . Back pain, thoracic 09/12/2012  . Bursitis of left hip   . Hiatal hernia   . Hyperlipidemia   . Aneurysm, cerebral, nonruptured       Prior to Admission medications   Medication Sig Start Date End Date Taking? Authorizing Provider  ALPRAZolam Duanne Moron) 1 MG tablet Take 1 tablet (1 mg total) by mouth at bedtime as needed for sleep. TAKE 1 TABLET BY MOUTH AT BEDTIME for insomnia 03/07/17   Crecencio Mc, MD  diazepam (VALIUM) 10 MG tablet Take 1 tablet (10 mg total) by mouth  every 12 (twelve) hours as needed for anxiety. 12/18/16   Crecencio Mc, MD  escitalopram (LEXAPRO) 10 MG tablet Take 1 tablet (10 mg total) by mouth daily. 03/11/17   Crecencio Mc, MD  ibuprofen (ADVIL,MOTRIN) 200 MG tablet Take 800 mg by mouth every 6 (six) hours as needed for headache.    Historical Provider, MD  metaxalone (SKELAXIN) 800 MG tablet Take 1 tablet (800 mg total) by mouth 3 (three) times daily. 12/20/16   Crecencio Mc, MD  valACYclovir (VALTREX) 1000 MG tablet Take 1 tablet (1,000 mg total) by mouth 3 (three) times daily. 03/02/16   Leone Haven, MD    Allergies Statins    Social History Social History  Substance Use Topics  . Smoking status: Former Smoker    Types: Cigarettes  . Smokeless tobacco: Never Used     Comment: quit smoking in Nov 2015  . Alcohol use 1.8 oz/week    3 Glasses of wine per week     Comment: occasional wine    Review of Systems Patient denies headaches, rhinorrhea, blurry vision, numbness, shortness of breath, chest pain, edema, cough, abdominal pain, nausea, vomiting, diarrhea, dysuria, fevers, rashes or hallucinations unless otherwise stated above in HPI. ____________________________________________   PHYSICAL EXAM:  VITAL SIGNS: Vitals:   03/12/17 1614  BP: (!) 142/83  Pulse: 61  Resp: 18  Temp: 98.4 F (36.9 C)    Constitutional: Alert and oriented. Well appearing and in no acute distress. Eyes: Conjunctivae are normal. PERRL. EOMI. Head: Atraumatic. Nose: No congestion/rhinnorhea. Mouth/Throat: Mucous membranes are moist.  Oropharynx non-erythematous. Neck: No stridor. Painless ROM. No cervical spine tenderness to palpation Hematological/Lymphatic/Immunilogical: No cervical lymphadenopathy. Cardiovascular: Normal rate, regular rhythm. Grossly normal heart sounds.  Good peripheral circulation. Respiratory: Normal respiratory effort.  No retractions. Lungs with faint expiratory wheezing  Gastrointestinal: Soft and  nontender. No distention. No abdominal bruits. No CVA tenderness. Genitourinary:  Musculoskeletal: No lower extremity tenderness nor edema.  No joint effusions. Neurologic:  Normal speech and language. No gross focal neurologic deficits are appreciated. No gait instability. Skin:  Skin is warm, dry and intact. No rash noted. Psychiatric: Mood and affect are normal. Speech and behavior are normal.  ____________________________________________   LABS (all labs ordered are listed, but only abnormal results are displayed)  Results for orders placed or performed during the hospital encounter of 03/12/17 (from the past 24 hour(s))  Basic metabolic panel     Status: Abnormal   Collection Time: 03/12/17  4:16 PM  Result Value Ref Range   Sodium 134 (L) 135 - 145 mmol/L   Potassium 4.2 3.5 - 5.1 mmol/L  Chloride 102 101 - 111 mmol/L   CO2 26 22 - 32 mmol/L   Glucose, Bld 90 65 - 99 mg/dL   BUN 15 6 - 20 mg/dL   Creatinine, Ser 0.65 0.44 - 1.00 mg/dL   Calcium 9.3 8.9 - 10.3 mg/dL   GFR calc non Af Amer >60 >60 mL/min   GFR calc Af Amer >60 >60 mL/min   Anion gap 6 5 - 15  CBC     Status: None   Collection Time: 03/12/17  4:16 PM  Result Value Ref Range   WBC 6.1 3.6 - 11.0 K/uL   RBC 4.33 3.80 - 5.20 MIL/uL   Hemoglobin 13.6 12.0 - 16.0 g/dL   HCT 39.7 35.0 - 47.0 %   MCV 91.7 80.0 - 100.0 fL   MCH 31.4 26.0 - 34.0 pg   MCHC 34.3 32.0 - 36.0 g/dL   RDW 13.5 11.5 - 14.5 %   Platelets 234 150 - 440 K/uL  Troponin I     Status: None   Collection Time: 03/12/17  4:16 PM  Result Value Ref Range   Troponin I <0.03 <0.03 ng/mL   ____________________________________________  EKG My review and personal interpretation at Time: 16:10   Indication: chest pain  Rate: 60  Rhythm: sinus Axis: normal Other: normal intervals, no acute ischemic changes ____________________________________________  RADIOLOGY  I personally reviewed all radiographic images ordered to evaluate for the above  acute complaints and reviewed radiology reports and findings.  These findings were personally discussed with the patient.  Please see medical record for radiology report.  ____________________________________________   PROCEDURES  Procedure(s) performed:  Procedures    Critical Care performed: no ____________________________________________   INITIAL IMPRESSION / ASSESSMENT AND PLAN / ED COURSE  Pertinent labs & imaging results that were available during my care of the patient were reviewed by me and considered in my medical decision making (see chart for details).  DDX: acs, bronchitis,   Bentley Haralson is a 61 y.o. who presents to the ED with symptoms as described above. Patient is afebrile and otherwise he'll dynamically stable. Does not appear to be in any acute distress. Not consistent with ACS as EKG and troponin are negative. Most consistent with bronchitis given her wheezing and recent URI. We will provide for scheduled for albuterol as well as steroids. Patient has follow-up with PCP.  Patient was able to tolerate PO and was able to ambulate with a steady gait.  Have discussed with the patient and available family all diagnostics and treatments performed thus far and all questions were answered to the best of my ability. The patient demonstrates understanding and agreement with plan.       ____________________________________________   FINAL CLINICAL IMPRESSION(S) / ED DIAGNOSES  Final diagnoses:  Hypertension, unspecified type  Chest pain, unspecified type  Bronchitis      NEW MEDICATIONS STARTED DURING THIS VISIT:  New Prescriptions   No medications on file     Note:  This document was prepared using Dragon voice recognition software and may include unintentional dictation errors.    Merlyn Lot, MD 03/12/17 [phone]

## 2017-03-12 NOTE — Discharge Instructions (Signed)
Return to ER for any increase in pain, if the pain changes or becomes worse with physical activity, you have shortness of breath, nausea or vomiting associated with the chest pain. ° °

## 2017-03-24 ENCOUNTER — Encounter: Payer: Self-pay | Admitting: Internal Medicine

## 2017-04-09 ENCOUNTER — Other Ambulatory Visit: Payer: Self-pay

## 2017-04-09 MED ORDER — ESCITALOPRAM OXALATE 10 MG PO TABS: 10.0000 mg | ORAL_TABLET | Freq: Every day | ORAL | 0 refills | Status: DC

## 2017-04-16 ENCOUNTER — Telehealth (HOSPITAL_COMMUNITY): Payer: Self-pay

## 2017-04-16 ENCOUNTER — Ambulatory Visit (INDEPENDENT_AMBULATORY_CARE_PROVIDER_SITE_OTHER): Payer: 59 | Admitting: Internal Medicine

## 2017-04-16 ENCOUNTER — Encounter: Payer: Self-pay | Admitting: Internal Medicine

## 2017-04-16 VITALS — BP 104/62 | HR 67 | Temp 98.1°F | Resp 17 | Ht 62.0 in | Wt 129.2 lb

## 2017-04-16 DIAGNOSIS — L720 Epidermal cyst: Secondary | ICD-10-CM | POA: Diagnosis not present

## 2017-04-16 DIAGNOSIS — I671 Cerebral aneurysm, nonruptured: Secondary | ICD-10-CM

## 2017-04-16 DIAGNOSIS — F418 Other specified anxiety disorders: Secondary | ICD-10-CM | POA: Diagnosis not present

## 2017-04-16 MED ORDER — ALPRAZOLAM 1 MG PO TABS: 1.0000 mg | ORAL_TABLET | Freq: Every evening | ORAL | 5 refills | Status: DC | PRN

## 2017-04-16 NOTE — Patient Instructions (Signed)
Epidermal Cyst An epidermal cyst is sometimes called an epidermal inclusion cyst or an infundibular cyst. It is a sac made of skin tissue. The sac contains a substance called keratin. Keratin is a protein that is normally secreted through the hair follicles. When keratin becomes trapped in the top layer of skin (epidermis), it can form an epidermal cyst. Epidermal cysts are usually found on the face, neck, trunk, and genitals. These cysts are usually harmless (benign), and they may not cause symptoms unless they become infected. It is important not to pop epidermal cysts yourself. What are the causes? This condition may be caused by:  A blocked hair follicle.  A hair that curls and re-enters the skin instead of growing straight out of the skin (ingrown hair).  A blocked pore.  Irritated skin.  An injury to the skin.  Certain conditions that are passed along from parent to child (inherited).  Human papillomavirus (HPV). What increases the risk? The following factors may make you more likely to develop an epidermal cyst:  Having acne.  Being overweight.  Wearing tight clothing. What are the signs or symptoms? The only symptom of this condition may be a small, painless lump underneath the skin. When an epidermal cyst becomes infected, symptoms may include:  Redness.  Inflammation.  Tenderness.  Warmth.  Fever.  Keratin draining from the cyst. Keratin may look like a grayish-white, bad-smelling substance.  Pus draining from the cyst. How is this diagnosed? This condition is diagnosed with a physical exam. In some cases, you may have a sample of tissue (biopsy) taken from your cyst to be examined under a microscope or tested for bacteria. You may be referred to a health care provider who specializes in skin care (dermatologist). How is this treated? In many cases, epidermal cysts go away on their own without treatment. If a cyst becomes infected, treatment may  include:  Opening and draining the cyst. After draining, minor surgery to remove the rest of the cyst may be done.  Antibiotic medicine to help prevent infection.  Injections of medicines (steroids) that help to reduce inflammation.  Surgery to remove the cyst. Surgery may be done if:  The cyst becomes large.  The cyst bothers you.  There is a chance that the cyst could turn into cancer. Follow these instructions at home:  Take over-the-counter and prescription medicines only as told by your health care provider.  If you were prescribed an antibiotic, use it as told by your health care provider. Do not stop using the antibiotic even if you start to feel better.  Keep the area around your cyst clean and dry.  Wear loose, dry clothing.  Do not try to pop your cyst.  Avoid touching your cyst.  Check your cyst every day for signs of infection.  Keep all follow-up visits as told by your health care provider. This is important. How is this prevented?  Wear clean, dry, clothing.  Avoid wearing tight clothing.  Keep your skin clean and dry. Shower or take baths every day.  Wash your body with a benzoyl peroxide wash when you shower or bathe. Contact a health care provider if:  Your cyst develops symptoms of infection.  Your condition is not improving or is getting worse.  You develop a cyst that looks different from other cysts you have had.  You have a fever. Get help right away if:  Redness spreads from the cyst into the surrounding area. This information is not intended to replace  if:  · Your cyst develops symptoms of infection.  · Your condition is not improving or is getting worse.  · You develop a cyst that looks different from other cysts you have had.  · You have a fever.  Get help right away if:  · Redness spreads from the cyst into the surrounding area.  This information is not intended to replace advice given to you by your health care provider. Make sure you discuss any questions you have with your health care provider.  Document Released: 10/27/2004 Document Revised: 07/25/2016 Document Reviewed: 09/28/2015  Elsevier Interactive Patient Education © 2017 [employer]

## 2017-04-16 NOTE — Progress Notes (Signed)
Subjective:  Patient ID: Katrina Woods, female    DOB: 08/08/1956  Age: 61 y.o. MRN: [id number]  CC: The primary encounter diagnosis was Epidermal cyst. Diagnoses of Aneurysm, cerebral, nonruptured and Depression with anxiety were also pertinent to this visit.  HPI Katrina Woods presents for follow up on grief  Following the death of her father several months ago complicated by conflict with her two brothers who were no tinvolved with care of Dad.    Had a panic attack on April  3,rd went to Urgent care and was sent to ER    Has started lexapro , only y had one since then.  Treated with rest and medication.  Feels better,  Takes it in the morning because she feels slight agitation      Non pharmacologic regimen: exercise.  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  14 lbs in the last year lost.  Small nodule noticed by friend on her upper thorix avobe te scapula        Outpatient Medications Prior to Visit  Medication Sig Dispense Refill  . escitalopram (LEXAPRO) 10 MG tablet Take 1 tablet (10 mg total) by mouth daily. 90 tablet 0  . ibuprofen (ADVIL,MOTRIN) 200 MG tablet Take 800 mg by mouth every 6 (six) hours as needed for headache.    . metaxalone (SKELAXIN) 800 MG tablet Take 1 tablet (800 mg total) by mouth 3 (three) times daily. 90 tablet 1  . ALPRAZolam (XANAX) 1 MG tablet Take 1 tablet (1 mg total) by mouth at bedtime as needed for sleep. TAKE 1 TABLET BY MOUTH AT BEDTIME for insomnia 30 tablet 0  . diazepam (VALIUM) 10 MG tablet Take 1 tablet (10 mg total) by mouth every 12 (twelve) hours as needed for anxiety. (Patient not taking: Reported on 04/16/2017) 30 tablet 1  . albuterol (PROVENTIL HFA;VENTOLIN HFA) 108 (90 Base) MCG/ACT inhaler Inhale 2 puffs into the lungs every 6 (six) hours as needed for wheezing or shortness of breath. (Patient not taking: Reported on 04/16/2017) 1 Inhaler 2  . valACYclovir (VALTREX) 1000 MG  tablet Take 1 tablet (1,000 mg total) by mouth 3 (three) times daily. (Patient not taking: Reported on 04/16/2017) 21 tablet 0   No facility-administered medications prior to visit.     Review of Systems;  Patient denies headache, fevers, malaise, unintentional weight loss, skin rash, eye pain, sinus congestion and sinus pain, sore throat, dysphagia,  hemoptysis , cough, dyspnea, wheezing, chest pain, palpitations, orthopnea, edema, abdominal pain, nausea, melena, diarrhea, constipation, flank pain, dysuria, hematuria, urinary  Frequency, nocturia, numbness, tingling, seizures,  Focal weakness, Loss of consciousness,  Tremor, insomnia, depression, anxiety, and suicidal ideation.      Objective:  BP 104/62 (BP Location: Left Arm, Patient Position: Sitting, Cuff Size: Normal)   Pulse 67   Temp 98.1 F (36.7 C) (Oral)   Resp 17   Ht 5\' 2"  (1.575 m)   Wt 129 lb 3.2 oz (58.6 kg)   SpO2 98%   BMI 23.63 kg/m   BP Readings from Last 3 Encounters:  04/16/17 104/62  03/12/17 139/85  12/20/16 128/82    Wt Readings from Last 3 Encounters:  04/16/17 129 lb 3.2 oz (58.6 kg)  03/12/17 130 lb (59 kg)  12/20/16 132 lb 6 oz (60 kg)    General appearance: alert, cooperative and appears stated age Ears: normal TM's and external ear canals both ears Throat:  lips, mucosa, and tongue normal; teeth and gums normal Neck: no adenopathy, no carotid bruit, supple, symmetrical, trachea midline and thyroid not enlarged, symmetric, no tenderness/mass/nodules Back: symmetric, no curvature. ROM normal. No CVA tenderness. Lungs: clear to auscultation bilaterally Heart: regular rate and rhythm, S1, S2 normal, no murmur, click, rub or gallop Abdomen: soft, non-tender; bowel sounds normal; no masses,  no organomegaly Pulses: 2+ and symmetric Skin: Skin color, texture, turgor normal. No rashes or lesions Lymph nodes: Cervical, supraclavicular, and axillary nodes normal.  No results found for: HGBA1C  Lab  Results  Component Value Date   CREATININE 0.65 03/12/2017   CREATININE 0.85 10/31/2016   CREATININE 0.76 09/13/2016    Lab Results  Component Value Date   WBC 6.1 03/12/2017   HGB 13.6 03/12/2017   HCT 39.7 03/12/2017   PLT 234 03/12/2017   GLUCOSE 90 03/12/2017   CHOL 258 (H) 10/31/2016   TRIG 147 10/31/2016   HDL 67 10/31/2016   LDLDIRECT 164.9 05/01/2013   LDLCALC 162 (H) 10/31/2016   ALT 6 10/31/2016   AST 16 10/31/2016   NA 134 (L) 03/12/2017   K 4.2 03/12/2017   CL 102 03/12/2017   CREATININE 0.65 03/12/2017   BUN 15 03/12/2017   CO2 26 03/12/2017   TSH 3.11 07/21/2014   INR 0.99 01/27/2016    Dg Chest 2 View  Result Date: 03/12/2017 CLINICAL DATA:  Chest pain with intermittent dizziness and nausea since 03/08/2017. EXAM: CHEST  2 VIEW COMPARISON:  None. FINDINGS: The lungs are clear. Heart size is normal. No pneumothorax or pleural effusion. Aortic atherosclerosis noted. No bony abnormality. IMPRESSION: No acute disease. Atherosclerosis. Electronically Signed   By: Inge Rise M.D.   On: 03/12/2017 16:48    Assessment & Plan:   Problem List Items Addressed This Visit    Epidermal cyst - Primary    Vs sebaceous cyst,  Left shoulder blade, not inflamed.   Now removal requested at this time.       Depression with anxiety    Improved with initiation of SSRI therapy (Lexapro) and prn alprazolam. The risks and benefits of benzodiazepine use were discussed with patient today including excessive sedation leading to respiratory depression,  impaired thinking/driving, and addiction.  Patient was advised to avoid concurrent use with alcohol, to use medication only as needed and not to share with others  .       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  Surveillance of  Aneurysm is managed by Dr Estanislado Pandy with periodic MRI/MRA surveillance.  No recent recurrence of headaches           A total of  25 minutes of face to face time was spent with patient more than half of which was spent in counselling about the above mentioned conditions  and coordination of care  I have discontinued Ms. Schum's valACYclovir and albuterol. I have also changed her ALPRAZolam. Additionally, I am having her maintain her ibuprofen, diazepam, metaxalone, and escitalopram.  Meds ordered this encounter  Medications  . ALPRAZolam (XANAX) 1 MG tablet    Sig: Take 1 tablet (1 mg total) by mouth at bedtime as needed for anxiety or sleep.    Dispense:  30 tablet    Refill:  5    May Refill now    Medications Discontinued During This Encounter  Medication Reason  . albuterol (PROVENTIL HFA;VENTOLIN HFA) 108 (90 Base)  MCG/ACT inhaler Error  . valACYclovir (VALTREX) 1000 MG tablet Patient has not taken in last 30 days  . ALPRAZolam (XANAX) 1 MG tablet Reorder    Follow-up: No Follow-up on file.   Crecencio Mc, MD

## 2017-04-16 NOTE — Telephone Encounter (Signed)
Called to schedule f/u mri, left message for pt to return call. AW 

## 2017-04-18 DIAGNOSIS — L720 Epidermal cyst: Secondary | ICD-10-CM | POA: Insufficient documentation

## 2017-04-18 NOTE — Assessment & Plan Note (Signed)
Improved with initiation of SSRI therapy (Lexapro) and prn alprazolam. The risks and benefits of benzodiazepine use were discussed with patient today including excessive sedation leading to respiratory depression,  impaired thinking/driving, and addiction.  Patient was advised to avoid concurrent use with alcohol, to use medication only as needed and not to share with others  .

## 2017-04-18 NOTE — Assessment & Plan Note (Signed)
Vs sebaceous cyst,  Left shoulder blade, not inflamed.   Now removal requested at this time.

## 2017-04-18 NOTE — Assessment & Plan Note (Signed)
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  Surveillance of  Aneurysm is managed by Dr Estanislado Pandy with periodic MRI/MRA surveillance.  No recent recurrence of headaches

## 2017-04-25 NOTE — Telephone Encounter (Signed)
Error

## 2017-04-29 ENCOUNTER — Telehealth (HOSPITAL_COMMUNITY): Payer: Self-pay

## 2017-04-29 NOTE — Telephone Encounter (Signed)
Called to schedule mri, left message for pt to return call. AW 

## 2017-05-22 ENCOUNTER — Telehealth (HOSPITAL_COMMUNITY): Payer: Self-pay

## 2017-05-22 NOTE — Telephone Encounter (Signed)
Called to schedule f/u mri, left message for pt to return call. AW 

## 2017-08-03 ENCOUNTER — Other Ambulatory Visit: Payer: Self-pay | Admitting: Internal Medicine

## 2017-09-04 NOTE — Telephone Encounter (Signed)
Error

## 2017-10-14 ENCOUNTER — Encounter: Payer: Self-pay | Admitting: Internal Medicine

## 2017-10-14 ENCOUNTER — Ambulatory Visit (INDEPENDENT_AMBULATORY_CARE_PROVIDER_SITE_OTHER): Payer: 59

## 2017-10-14 ENCOUNTER — Ambulatory Visit (INDEPENDENT_AMBULATORY_CARE_PROVIDER_SITE_OTHER): Payer: 59 | Admitting: Internal Medicine

## 2017-10-14 ENCOUNTER — Other Ambulatory Visit (HOSPITAL_COMMUNITY)
Admission: RE | Admit: 2017-10-14 | Discharge: 2017-10-14 | Disposition: A | Discharge status: Home / Self Care | Payer: 59 | Source: Ambulatory Visit | Attending: Internal Medicine | Admitting: Internal Medicine

## 2017-10-14 VITALS — BP 100/78 | HR 59 | Temp 97.7°F | Resp 15 | Ht 62.0 in | Wt 130.6 lb

## 2017-10-14 DIAGNOSIS — Z124 Encounter for screening for malignant neoplasm of cervix: Secondary | ICD-10-CM | POA: Diagnosis not present

## 2017-10-14 DIAGNOSIS — G8929 Other chronic pain: Secondary | ICD-10-CM

## 2017-10-14 DIAGNOSIS — R5383 Other fatigue: Secondary | ICD-10-CM | POA: Diagnosis not present

## 2017-10-14 DIAGNOSIS — M5386 Other specified dorsopathies, lumbar region: Secondary | ICD-10-CM

## 2017-10-14 DIAGNOSIS — Z23 Encounter for immunization: Secondary | ICD-10-CM

## 2017-10-14 DIAGNOSIS — D126 Benign neoplasm of colon, unspecified: Secondary | ICD-10-CM

## 2017-10-14 DIAGNOSIS — Z1239 Encounter for other screening for malignant neoplasm of breast: Secondary | ICD-10-CM

## 2017-10-14 DIAGNOSIS — F4310 Post-traumatic stress disorder, unspecified: Secondary | ICD-10-CM | POA: Diagnosis not present

## 2017-10-14 DIAGNOSIS — Z1231 Encounter for screening mammogram for malignant neoplasm of breast: Secondary | ICD-10-CM

## 2017-10-14 DIAGNOSIS — M546 Pain in thoracic spine: Secondary | ICD-10-CM

## 2017-10-14 DIAGNOSIS — Z87891 Personal history of nicotine dependence: Secondary | ICD-10-CM | POA: Diagnosis not present

## 2017-10-14 DIAGNOSIS — E7800 Pure hypercholesterolemia, unspecified: Secondary | ICD-10-CM

## 2017-10-14 DIAGNOSIS — M48061 Spinal stenosis, lumbar region without neurogenic claudication: Secondary | ICD-10-CM | POA: Diagnosis not present

## 2017-10-14 DIAGNOSIS — F4321 Adjustment disorder with depressed mood: Secondary | ICD-10-CM

## 2017-10-14 DIAGNOSIS — F4381 Prolonged grief disorder: Secondary | ICD-10-CM

## 2017-10-14 DIAGNOSIS — Z0000 Encounter for general adult medical examination without abnormal findings: Secondary | ICD-10-CM | POA: Diagnosis not present

## 2017-10-14 DIAGNOSIS — Z01419 Encounter for gynecological examination (general) (routine) without abnormal findings: Secondary | ICD-10-CM | POA: Diagnosis not present

## 2017-10-14 DIAGNOSIS — Z1159 Encounter for screening for other viral diseases: Secondary | ICD-10-CM | POA: Diagnosis not present

## 2017-10-14 DIAGNOSIS — M5431 Sciatica, right side: Secondary | ICD-10-CM

## 2017-10-14 DIAGNOSIS — F4329 Adjustment disorder with other symptoms: Secondary | ICD-10-CM

## 2017-10-14 LAB — COMPREHENSIVE METABOLIC PANEL
ALK PHOS: 61 U/L (ref 39–117)
ALT: 11 U/L (ref 0–35)
AST: 18 U/L (ref 0–37)
Albumin: 4.3 g/dL (ref 3.5–5.2)
BUN: 17 mg/dL (ref 6–23)
CALCIUM: 10.3 mg/dL (ref 8.4–10.5)
CO2: 24 meq/L (ref 19–32)
Chloride: 103 mEq/L (ref 96–112)
Creatinine, Ser: 0.84 mg/dL (ref 0.40–1.20)
GFR: 73.04 mL/min (ref >60.00)
Glucose, Bld: 102 mg/dL — ABNORMAL HIGH (ref 70–99)
POTASSIUM: 4.4 meq/L (ref 3.5–5.1)
Sodium: 136 mEq/L (ref 135–145)
Total Bilirubin: 0.5 mg/dL (ref 0.2–1.2)
Total Protein: 7.4 g/dL (ref 6.0–8.3)

## 2017-10-14 LAB — LIPID PANEL
CHOLESTEROL: 257 mg/dL — AB (ref 0–200)
HDL: 61.5 mg/dL (ref >39.00)
LDL Cholesterol: 171 mg/dL — ABNORMAL HIGH (ref 0–99)
NONHDL: 195.35
TRIGLYCERIDES: 124 mg/dL (ref 0.0–149.0)
Total CHOL/HDL Ratio: 4
VLDL: 24.8 mg/dL (ref 0.0–40.0)

## 2017-10-14 LAB — CBC WITH DIFFERENTIAL/PLATELET
BASOS ABS: 0.1 10*3/uL (ref 0.0–0.1)
Basophils Relative: 1.1 % (ref 0.0–3.0)
Eosinophils Absolute: 0.1 10*3/uL (ref 0.0–0.7)
Eosinophils Relative: 1.3 % (ref 0.0–5.0)
HEMATOCRIT: 47.3 % — AB (ref 36.0–46.0)
HEMOGLOBIN: 15.7 g/dL — AB (ref 12.0–15.0)
LYMPHS PCT: 35.7 % (ref 12.0–46.0)
Lymphs Abs: 2.6 10*3/uL (ref 0.7–4.0)
MCHC: 33.2 g/dL (ref 30.0–36.0)
MCV: 95.2 fl (ref 78.0–100.0)
MONOS PCT: 8.6 % (ref 3.0–12.0)
Monocytes Absolute: 0.6 10*3/uL (ref 0.1–1.0)
NEUTROS ABS: 3.8 10*3/uL (ref 1.4–7.7)
NEUTROS PCT: 53.3 % (ref 43.0–77.0)
PLATELETS: 209 10*3/uL (ref 150.0–400.0)
RBC: 4.97 Mil/uL (ref 3.87–5.11)
RDW: 13.7 % (ref 11.5–15.5)
WBC: 7.2 10*3/uL (ref 4.0–10.5)

## 2017-10-14 LAB — LDL CHOLESTEROL, DIRECT: LDL DIRECT: 166 mg/dL

## 2017-10-14 LAB — TSH: TSH: 2.68 u[IU]/mL (ref 0.35–4.50)
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
## 2017-10-14 NOTE — Patient Instructions (Addendum)
I am referring you to Dr Nicolasa Ducking  .  Here are the names of several well respected female therapists   Lennon Alstrom    [phone] [place]   [phone] [place] [phone]  Achilles Dunk  [phone]  Altha Harm   But if you need Grief Counselling:   I recommend Trey Paula as your therapist  (referral made )  MRI lumbar spine ordered  Mammogram ordered

## 2017-10-15 ENCOUNTER — Telehealth: Payer: Self-pay | Admitting: Internal Medicine

## 2017-10-15 DIAGNOSIS — F4381 Prolonged grief disorder: Secondary | ICD-10-CM | POA: Insufficient documentation

## 2017-10-15 DIAGNOSIS — M5387 Other specified dorsopathies, lumbosacral region: Secondary | ICD-10-CM | POA: Insufficient documentation

## 2017-10-15 DIAGNOSIS — M5431 Sciatica, right side: Secondary | ICD-10-CM | POA: Insufficient documentation

## 2017-10-15 DIAGNOSIS — F4310 Post-traumatic stress disorder, unspecified: Secondary | ICD-10-CM | POA: Insufficient documentation

## 2017-10-15 DIAGNOSIS — F4321 Adjustment disorder with depressed mood: Secondary | ICD-10-CM | POA: Insufficient documentation

## 2017-10-15 LAB — HEPATITIS C ANTIBODY
Hepatitis C Ab: NONREACTIVE
SIGNAL TO CUT-OFF: 0.01 (ref <1.00)

## 2017-10-15 NOTE — Assessment & Plan Note (Signed)
Annual comprehensive preventive exam was done as well as an evaluation and management of chronic conditions .  During the course of the visit the patient was educated and counseled about appropriate screening and preventive services including :  diabetes screening, lipid analysis with projected  10 year  risk for CAD , nutrition counseling, breast, cervical and colorectal cancer screening, and recommended immunizations.  Printed recommendations for health maintenance screenings was given 

## 2017-10-15 NOTE — Assessment & Plan Note (Signed)
Continue lexapro, referral to grief counselling

## 2017-10-15 NOTE — Assessment & Plan Note (Addendum)
Plain films of lumbar spine ordered today show degenerative changes at the L5-S1 level and anterolisthesis .  MRI ordered.

## 2017-10-15 NOTE — Assessment & Plan Note (Signed)
Given her concurrent use of tobacco , her 10 year risk of coronary artery disease is elevated .  Atherosclerosis is also noted on plain films,.  Statin therapy recommended.   Lab Results  Component Value Date   CHOL 257 (H) 10/14/2017   HDL 61.50 10/14/2017   LDLCALC 171 (H) 10/14/2017   LDLDIRECT 166.0 10/14/2017   TRIG 124.0 10/14/2017   CHOLHDL 4 10/14/2017

## 2017-10-15 NOTE — Assessment & Plan Note (Signed)
Referral to psychiatry for treating given failure to approve with medication adjustment.

## 2017-10-16 ENCOUNTER — Encounter: Payer: Self-pay | Admitting: Internal Medicine

## 2017-10-16 DIAGNOSIS — Z79899 Other long term (current) drug therapy: Secondary | ICD-10-CM

## 2017-10-16 DIAGNOSIS — M5431 Sciatica, right side: Secondary | ICD-10-CM

## 2017-10-16 LAB — CYTOLOGY - PAP
Adequacy: ABSENT
DIAGNOSIS: NEGATIVE
Diagnosis: REACTIVE
HPV: NOT DETECTED

## 2017-10-17 MED ORDER — ATORVASTATIN CALCIUM 20 MG PO TABS: 20.0000 mg | ORAL_TABLET | Freq: Every day | ORAL | 3 refills | Status: DC

## 2017-10-25 ENCOUNTER — Ambulatory Visit
Admission: RE | Admit: 2017-10-25 | Discharge: 2017-10-25 | Disposition: A | Discharge status: Home / Self Care | Payer: 59 | Source: Ambulatory Visit | Attending: Internal Medicine | Admitting: Internal Medicine

## 2017-10-25 DIAGNOSIS — M5386 Other specified dorsopathies, lumbar region: Secondary | ICD-10-CM | POA: Insufficient documentation

## 2017-10-25 DIAGNOSIS — M48061 Spinal stenosis, lumbar region without neurogenic claudication: Secondary | ICD-10-CM | POA: Insufficient documentation

## 2017-10-25 DIAGNOSIS — M5136 Other intervertebral disc degeneration, lumbar region: Secondary | ICD-10-CM | POA: Diagnosis not present

## 2017-10-25 DIAGNOSIS — M5126 Other intervertebral disc displacement, lumbar region: Secondary | ICD-10-CM | POA: Insufficient documentation

## 2017-10-27 ENCOUNTER — Encounter: Payer: Self-pay | Admitting: Internal Medicine

## 2017-10-28 ENCOUNTER — Other Ambulatory Visit: Payer: Self-pay | Admitting: Internal Medicine

## 2017-10-28 MED ORDER — MELOXICAM 15 MG PO TABS: 15.0000 mg | ORAL_TABLET | Freq: Every day | ORAL | 3 refills | Status: DC

## 2017-11-15 ENCOUNTER — Other Ambulatory Visit: Payer: Self-pay

## 2017-11-15 ENCOUNTER — Other Ambulatory Visit: Payer: Self-pay | Admitting: Internal Medicine

## 2017-11-15 MED ORDER — ESCITALOPRAM OXALATE 10 MG PO TABS: 10.0000 mg | ORAL_TABLET | Freq: Every day | ORAL | 1 refills | Status: DC

## 2017-11-22 NOTE — Telephone Encounter (Signed)
Refilled: 04/16/2017 Last OV: 10/14/2017 Next OV: not scheduled

## 2017-11-22 NOTE — Telephone Encounter (Signed)
Printed, signed and faxed.  

## 2017-12-04 NOTE — Telephone Encounter (Signed)
My Chart message sent

## 2017-12-06 ENCOUNTER — Encounter: Payer: Self-pay | Admitting: Gastroenterology

## 2018-01-01 ENCOUNTER — Ambulatory Visit: Payer: 59 | Admitting: Family Medicine

## 2018-01-01 ENCOUNTER — Other Ambulatory Visit: Payer: Self-pay

## 2018-01-01 ENCOUNTER — Encounter: Payer: Self-pay | Admitting: Family Medicine

## 2018-01-01 VITALS — BP 108/70 | HR 78 | Temp 98.4°F | Wt 129.2 lb

## 2018-01-01 DIAGNOSIS — J101 Influenza due to other identified influenza virus with other respiratory manifestations: Secondary | ICD-10-CM

## 2018-01-01 DIAGNOSIS — R05 Cough: Secondary | ICD-10-CM

## 2018-01-01 DIAGNOSIS — R059 Cough, unspecified: Secondary | ICD-10-CM

## 2018-01-01 LAB — POCT INFLUENZA A/B
INFLUENZA B, POC: NEGATIVE
Influenza A, POC: POSITIVE — AB

## 2018-01-01 MED ORDER — BENZONATATE 200 MG PO CAPS: 200.0000 mg | ORAL_CAPSULE | Freq: Two times a day (BID) | ORAL | 0 refills | Status: DC | PRN

## 2018-01-01 NOTE — Progress Notes (Signed)
  Tommi Rumps, MD Phone: [phone]  Katrina Woods is a 62 y.o. female who presents today for same-day visit.  Patient reports 4 days ago symptoms started suddenly with cough and chills and fevers.  T-max 101.5 F.  She has had postnasal drip and sore throat.  Maxillary sinus congestion and pressure.  No fevers today.  No trouble breathing.  No wheezing.  She is coughing up a small amount of mucus.  She has been taking Advil and Mucinex with some benefit.  No known sick contacts.  Does note some sinus congestion headaches with this.  No sudden onset worst headache of life.  Social History   Tobacco Use  Smoking Status Former Smoker  . Types: Cigarettes  Smokeless Tobacco Never Used  Tobacco Comment   quit smoking in Nov 2015     ROS see history of present illness  Objective  Physical Exam Vitals:   01/01/18 1101  BP: 108/70  Pulse: 78  Temp: 98.4 F (36.9 C)  SpO2: 98%    BP Readings from Last 3 Encounters:  01/01/18 108/70  10/14/17 100/78  04/16/17 104/62   Wt Readings from Last 3 Encounters:  01/01/18 129 lb 3.2 oz (58.6 kg)  10/14/17 130 lb 9.6 oz (59.2 kg)  04/16/17 129 lb 3.2 oz (58.6 kg)    Physical Exam  Constitutional: No distress.  HENT:  Head: Normocephalic and atraumatic.  Mouth/Throat: No oropharyngeal exudate.  Mild oropharyngeal erythema, no exudate, slight maxillary sinus tenderness to percussion on both sides  Eyes: Conjunctivae are normal. Pupils are equal, round, and reactive to light.  Cardiovascular: Normal rate, regular rhythm and normal heart sounds.  Pulmonary/Chest: Effort normal.  Slightly coarse breath sounds, no wheezing  Musculoskeletal: She exhibits no edema.  Neurological: She is alert. Gait normal.  Skin: Skin is warm and dry. She is not diaphoretic.     Assessment/Plan: Please see individual problem list.  Influenza A Symptoms consistent with flu and rapid flu test positive for flu A.  She is outside the  window for treatment.  Discussed supportive care.  She will continue Mucinex and Advil.  Tessalon for cough.  If that is not beneficial she will contact us.  We will have her out of work through the weekend.  A letter was provided.  She is given return precautions.   Orders Placed This Encounter  Procedures  . POCT Influenza A/B    Meds ordered this encounter  Medications  . benzonatate (TESSALON) 200 MG capsule    Sig: Take 1 capsule (200 mg total) by mouth 2 (two) times daily as needed for cough.    Dispense:  20 capsule    Refill:  0     Tommi Rumps, MD [place]

## 2018-01-01 NOTE — Patient Instructions (Signed)
Nice to see you. You have the flu.  You should continue to feel better from here on out. We will treat your cough with Tessalon. If your fevers persist or you develop worsening symptoms or recurrent symptoms after improving please be reevaluated. If you develop shortness of breath or cough productive of blood please go to the emergency room.

## 2018-01-01 NOTE — Assessment & Plan Note (Signed)
Symptoms consistent with flu and rapid flu test positive for flu A.  She is outside the window for treatment.  Discussed supportive care.  She will continue Mucinex and Advil.  Tessalon for cough.  If that is not beneficial she will contact us.  We will have her out of work through the weekend.  A letter was provided.  She is given return precautions.

## 2018-01-05 ENCOUNTER — Encounter: Payer: Self-pay | Admitting: Internal Medicine

## 2018-01-07 ENCOUNTER — Other Ambulatory Visit: Payer: Self-pay | Admitting: Internal Medicine

## 2018-01-07 MED ORDER — LEVOFLOXACIN 500 MG PO TABS: 500.0000 mg | ORAL_TABLET | Freq: Every day | ORAL | 0 refills | Status: DC

## 2018-01-07 MED ORDER — PREDNISONE 10 MG PO TABS: ORAL_TABLET | ORAL | 0 refills | Status: DC

## 2018-01-07 NOTE — Progress Notes (Signed)
Treating for bronchitis post influenza

## 2018-01-22 ENCOUNTER — Other Ambulatory Visit: Payer: Self-pay | Admitting: Family Medicine

## 2018-01-22 NOTE — Telephone Encounter (Signed)
Patient of DR.Derrel Nip

## 2018-01-30 NOTE — Telephone Encounter (Signed)
Refilled: 01/01/2018 Last OV: 10/14/2017 Next OV: not scheduled

## 2018-02-12 ENCOUNTER — Encounter: Payer: Self-pay | Admitting: Internal Medicine

## 2018-02-20 ENCOUNTER — Telehealth: Payer: Self-pay

## 2018-02-20 NOTE — Telephone Encounter (Signed)
Spoke with pt on the phone and she stated that she is feeling better. Apologized to the pt in the delay of response and advised the pt that she should never use my chart for anything urgent. The pt was very understanding and stated that if her symptoms were to come back she would contact our office instead. Explained to the pt what mychart can be used for. Pt gave a verbal understanding.

## 2018-03-06 ENCOUNTER — Other Ambulatory Visit: Payer: Self-pay | Admitting: Internal Medicine

## 2018-03-06 ENCOUNTER — Telehealth (HOSPITAL_COMMUNITY): Payer: Self-pay

## 2018-03-06 ENCOUNTER — Encounter: Payer: Self-pay | Admitting: Internal Medicine

## 2018-03-06 DIAGNOSIS — R0683 Snoring: Secondary | ICD-10-CM

## 2018-03-06 DIAGNOSIS — R0681 Apnea, not elsewhere classified: Secondary | ICD-10-CM

## 2018-03-06 DIAGNOSIS — Z1231 Encounter for screening mammogram for malignant neoplasm of breast: Secondary | ICD-10-CM

## 2018-03-06 NOTE — Telephone Encounter (Signed)
Pt called to schedule her f/u mri. She didn't get to do her f/u last year and wanted to schedule now if possible. I sent the request for her f/u mri so that I can get authorization and informed the pt. I let her know that I would call her as soon as I get something back. She agreed with this plan. AW

## 2018-03-13 ENCOUNTER — Encounter: Payer: Self-pay | Admitting: Internal Medicine

## 2018-03-13 ENCOUNTER — Ambulatory Visit (INDEPENDENT_AMBULATORY_CARE_PROVIDER_SITE_OTHER): Payer: 59 | Admitting: Internal Medicine

## 2018-03-13 VITALS — BP 120/80 | HR 72 | Ht 62.0 in | Wt 126.0 lb

## 2018-03-13 DIAGNOSIS — G4719 Other hypersomnia: Secondary | ICD-10-CM

## 2018-03-13 NOTE — Progress Notes (Signed)
Name: Katrina Woods MRN: [id number] DOB: [birth year]     CONSULTATION DATE: 4.4.19 REFERRING MD : Derrel Nip  CHIEF COMPLAINT: snoring  STUDIES:    03/2017 CXR independently reviewed by Me No acute pneumonia No effusions  HISTORY OF PRESENT ILLNESS: 62 yo yo WF seen today for excessive daytime sleepiness seen today for problems with sleep Patient  has been having sleep problems for over one year Patient has been having excessive daytime sleepiness Patient has been having extreme fatigue and tiredness, lack of energy +  very Loud snoring every night + struggling breathe at night and gasps for air   No signs of infection, no signs of CHF Remote history of smoking No weight loss  Yoga teacher Very active    PAST MEDICAL HISTORY :   has a past medical history of Aneurysm ([place]), Back pain, Bursitis of left hip (8-13), Depression, GERD (gastroesophageal reflux disease), Headache, Hiatal hernia (209), History of bronchitis (2015), History of colon polyps, History of kidney stones, History of shingles, Hyperlipidemia, Insomnia, and Weakness.  has a past surgical history that includes Abdominal hysterectomy (2004); Knee cartilage surgery (Left, 1987); Cesarean section (11-25-82); Wisdom tooth extraction (1986); Cerebral aneurysm repair (2006); Colonoscopy; Esophagogastroduodenoscopy; Radiology with anesthesia (N/A, 05/11/2015); and ir generic historical (10/23/2016). Prior to Admission medications   Medication Sig Start Date End Date Taking? Authorizing Provider  ALPRAZolam Duanne Moron) 1 MG tablet TAKE 1 TABLET BY MOUTH AT BEDTIME AS NEEDED FOR ANXIETY OR SLEEP 11/22/17  Yes Crecencio Mc, MD  atorvastatin (LIPITOR) 20 MG tablet Take 1 tablet (20 mg total) daily by mouth. 10/17/17  Yes Crecencio Mc, MD  escitalopram (LEXAPRO) 10 MG tablet Take 1 tablet (10 mg total) by mouth daily. 11/15/17  Yes Crecencio Mc, MD  ibuprofen (ADVIL,MOTRIN) 200 MG tablet Take 800 mg by mouth every  6 (six) hours as needed for headache.   Yes [provider]  meloxicam (MOBIC) 15 MG tablet Take 1 tablet (15 mg total) daily by mouth. 10/28/17  Yes Crecencio Mc, MD  metaxalone (SKELAXIN) 800 MG tablet Take 1 tablet (800 mg total) by mouth 3 (three) times daily. 12/20/16  Yes Crecencio Mc, MD   Allergies  Allergen Reactions  . Etodolac     Other reaction(s): Other (See Comments)  . Pollen Extract     Other reaction(s): Unknown  . Statins     Muscle aches- states is doing fine with Atorvastatin    FAMILY HISTORY:  family history includes Appendicitis in her father; Cancer in her father, maternal grandmother, and mother; Diabetes in her maternal grandmother and mother; Diabetes (age of onset: 7) in her brother; Heart block in her paternal grandfather; Heart disease in her maternal grandmother and mother; Hypertension in her brother, maternal grandmother, and mother; Stroke in her maternal grandfather. SOCIAL HISTORY:  reports that she has quit smoking. Her smoking use included cigarettes. She has never used smokeless tobacco. She reports that she drinks about 1.8 oz of alcohol per week. She reports that she does not use drugs.  REVIEW OF SYSTEMS:   Constitutional: Negative for fever, chills, weight loss, +malaise/fatigue and diaphoresis.  HENT: Negative for hearing loss, ear pain, nosebleeds, congestion, sore throat, neck pain, tinnitus and ear discharge.   Eyes: Negative for blurred vision, double vision, photophobia, pain, discharge and redness.  Respiratory: Negative for cough, hemoptysis, sputum production, shortness of breath, wheezing and stridor.   Cardiovascular: Negative for chest pain, palpitations, orthopnea, claudication, leg swelling and PND.  Gastrointestinal: Negative for heartburn, nausea, vomiting, abdominal pain, diarrhea, constipation, blood in stool and melena.  Genitourinary: Negative for dysuria, urgency, frequency, hematuria and flank pain.    Musculoskeletal: Negative for myalgias, back pain, joint pain and falls.  Skin: Negative for itching and rash.  Neurological: Negative for dizziness, tingling, tremors, sensory change, speech change, focal weakness, seizures, loss of consciousness, weakness and headaches.  Endo/Heme/Allergies: Negative for environmental allergies and polydipsia. Does not bruise/bleed easily.  ALL OTHER ROS ARE NEGATIVE    BP 120/80 (BP Location: Left Arm, Cuff Size: Normal)   Pulse 72   Ht 5\' 2"  (1.575 m)   Wt 126 lb (57.2 kg)   SpO2 98%   BMI 23.05 kg/m    Physical Examination:   GENERAL:NAD, no fevers, chills, no weakness no fatigue HEAD: Normocephalic, atraumatic.  EYES: Pupils equal, round, reactive to light. Extraocular muscles intact. No scleral icterus.  MOUTH: Moist mucosal membrane.   EAR, NOSE, THROAT: Clear without exudates. No external lesions.  NECK: Supple. No thyromegaly. No nodules. No JVD.  PULMONARY:CTA B/L no wheezes, no crackles, no rhonchi CARDIOVASCULAR: S1 and S2. Regular rate and rhythm. No murmurs, rubs, or gallops. No edema.  GASTROINTESTINAL: Soft, nontender, nondistended. No masses. Positive bowel sounds.  MUSCULOSKELETAL: No swelling, clubbing, or edema. Range of motion full in all extremities.  NEUROLOGIC: Cranial nerves II through XII are intact. No gross focal neurological deficits.  SKIN: No ulceration, lesions, rashes, or cyanosis. Skin warm and dry. Turgor intact.  PSYCHIATRIC: Mood, affect within normal limits. The patient is awake, alert and oriented x 3. Insight, judgment intact.      ASSESSMENT / PLAN: 62 yo pleasant white female seen today for excessive daytime sleepiness with fatigue, signs and symptoms related to underlying OSA  Patient needs sleep study ASAP    Patient  satisfied with Plan of action and management. All questions answered  Corrin Parker, M.D.  [facility]  Medical Director [place] Director [facility] Cardio-Pulmonary Department

## 2018-03-13 NOTE — Patient Instructions (Signed)
Need to Obtain Sleep Study

## 2018-03-14 ENCOUNTER — Other Ambulatory Visit: Payer: Self-pay

## 2018-03-14 ENCOUNTER — Encounter: Payer: Self-pay | Admitting: Gastroenterology

## 2018-03-14 ENCOUNTER — Ambulatory Visit (AMBULATORY_SURGERY_CENTER): Payer: Self-pay

## 2018-03-14 VITALS — Ht 62.0 in | Wt 128.6 lb

## 2018-03-14 DIAGNOSIS — Z86010 Personal history of colonic polyps: Secondary | ICD-10-CM

## 2018-03-14 MED ORDER — NA SULFATE-K SULFATE-MG SULF 17.5-3.13-1.6 GM/177ML PO SOLN: 1.0000 | Freq: Once | ORAL | 0 refills | Status: AC

## 2018-03-14 NOTE — Progress Notes (Signed)
No egg or soy allergy known to patient  No issues with past sedation with any surgeries  or procedures, no intubation problems  No diet pills per patient No home 02 use per patient  No blood thinners per patient  Pt denies issues with constipation  No A fib or A flutter  EMMI video sent to pt's e mail , pt declined has had prior colonoscopy

## 2018-03-24 ENCOUNTER — Other Ambulatory Visit (HOSPITAL_COMMUNITY): Payer: Self-pay | Admitting: Interventional Radiology

## 2018-03-24 ENCOUNTER — Telehealth (HOSPITAL_COMMUNITY): Payer: Self-pay

## 2018-03-24 DIAGNOSIS — I729 Aneurysm of unspecified site: Secondary | ICD-10-CM

## 2018-03-24 NOTE — Telephone Encounter (Signed)
Called to schedule mra, no answer, left message for pt to return call. AW

## 2018-03-26 ENCOUNTER — Other Ambulatory Visit: Payer: Self-pay

## 2018-03-26 ENCOUNTER — Ambulatory Visit (AMBULATORY_SURGERY_CENTER): Payer: 59 | Admitting: Gastroenterology

## 2018-03-26 ENCOUNTER — Encounter: Payer: Self-pay | Admitting: Gastroenterology

## 2018-03-26 VITALS — BP 123/80 | HR 53 | Temp 98.6°F | Resp 24 | Ht 62.0 in | Wt 128.0 lb

## 2018-03-26 DIAGNOSIS — D125 Benign neoplasm of sigmoid colon: Secondary | ICD-10-CM | POA: Diagnosis not present

## 2018-03-26 DIAGNOSIS — Z86010 Personal history of colonic polyps: Secondary | ICD-10-CM | POA: Diagnosis present

## 2018-03-26 MED ORDER — SODIUM CHLORIDE 0.9 % IV SOLN: 500.0000 mL | Freq: Once | INTRAVENOUS | Status: DC

## 2018-03-26 NOTE — Progress Notes (Signed)
Report given to PACU, vss 

## 2018-03-26 NOTE — Progress Notes (Signed)
Called to room to assist during endoscopic procedure.  Patient ID and intended procedure confirmed with present staff. Received instructions for my participation in the procedure from the performing physician.  

## 2018-03-26 NOTE — Patient Instructions (Signed)
Impressions/Recommendations  Polyps (handout) Hemorrhoids (handout)  YOU HAD AN ENDOSCOPIC PROCEDURE TODAY AT [facility]:   Refer to the procedure report that was given to you for any specific questions about what was found during the examination.  If the procedure report does not answer your questions, please call your gastroenterologist to clarify.  If you requested that your care partner not be given the details of your procedure findings, then the procedure report has been included in a sealed envelope for you to review at your convenience later.  YOU SHOULD EXPECT: Some feelings of bloating in the abdomen. Passage of more gas than usual.  Walking can help get rid of the air that was put into your GI tract during the procedure and reduce the bloating. If you had a lower endoscopy (such as a colonoscopy or flexible sigmoidoscopy) you may notice spotting of blood in your stool or on the toilet paper. If you underwent a bowel prep for your procedure, you may not have a normal bowel movement for a few days.  Please Note:  You might notice some irritation and congestion in your nose or some drainage.  This is from the oxygen used during your procedure.  There is no need for concern and it should clear up in a day or so.  SYMPTOMS TO REPORT IMMEDIATELY:   Following lower endoscopy (colonoscopy or flexible sigmoidoscopy):  Excessive amounts of blood in the stool  Significant tenderness or worsening of abdominal pains  Swelling of the abdomen that is new, acute  Fever of 100F or higher    For urgent or emergent issues, a gastroenterologist can be reached at any hour by calling [phone].   DIET:  We do recommend a small meal at first, but then you may proceed to your regular diet.  Drink plenty of fluids but you should avoid alcoholic beverages for 24 hours.  ACTIVITY:  You should plan to take it easy for the rest of today and you should NOT DRIVE or use heavy machinery  until tomorrow (because of the sedation medicines used during the test).    FOLLOW UP: Our staff will call the number listed on your records the next business day following your procedure to check on you and address any questions or concerns that you may have regarding the information given to you following your procedure. If we do not reach you, we will leave a message.  However, if you are feeling well and you are not experiencing any problems, there is no need to return our call.  We will assume that you have returned to your regular daily activities without incident.  If any biopsies were taken you will be contacted by phone or by letter within the next 1-3 weeks.  Please call us at [phone] if you have not heard about the biopsies in 3 weeks.    SIGNATURES/CONFIDENTIALITY: You and/or your care partner have signed paperwork which will be entered into your electronic medical record.  These signatures attest to the fact that that the information above on your After Visit Summary has been reviewed and is understood.  Full responsibility of the confidentiality of this discharge information lies with you and/or your care-partner.

## 2018-03-26 NOTE — Op Note (Signed)
[facility] Patient Name: Katrina Woods Procedure Date: 03/26/2018 9:28 AM MRN: [id number] Endoscopist: Ladene Artist , MD Age: 62 Referring MD:  Date of Birth: 1956-05-16 Gender: Female Account #: [id number] Procedure:                Colonoscopy Indications:              Surveillance: Personal history of adenomatous                            polyps on last colonoscopy 5 years ago Medicines:                Monitored Anesthesia Care Procedure:                Pre-Anesthesia Assessment:                           - Prior to the procedure, a History and Physical                            was performed, and patient medications and                            allergies were reviewed. The patient's tolerance of                            previous anesthesia was also reviewed. The risks                            and benefits of the procedure and the sedation                            options and risks were discussed with the patient.                            All questions were answered, and informed consent                            was obtained. Prior Anticoagulants: The patient has                            taken no previous anticoagulant or antiplatelet                            agents. ASA Grade Assessment: II - A patient with                            mild systemic disease. After reviewing the risks                            and benefits, the patient was deemed in                            satisfactory condition to undergo the procedure.  After obtaining informed consent, the colonoscope                            was passed under direct vision. Throughout the                            procedure, the patient's blood pressure, pulse, and                            oxygen saturations were monitored continuously. The                            Model PCF-[id number]) scope was introduced                            through the anus and  advanced to the the cecum,                            identified by appendiceal orifice and ileocecal                            valve. The ileocecal valve, appendiceal orifice,                            and rectum were photographed. The quality of the                            bowel preparation was good. The colonoscopy was                            performed without difficulty. The patient tolerated                            the procedure well. Scope In: 9:40:33 AM Scope Out: 9:55:36 AM Scope Withdrawal Time: 0 hours 11 minutes 54 seconds  Total Procedure Duration: 0 hours 15 minutes 3 seconds  Findings:                 The perianal and digital rectal examinations were                            normal.                           Two sessile polyps were found in the sigmoid colon.                            The polyps were 6 to 7 mm in size. These polyps                            were removed with a cold snare. Resection and                            retrieval were complete.  Internal hemorrhoids were found during                            retroflexion. The hemorrhoids were small and Grade                            I (internal hemorrhoids that do not prolapse).                           The exam was otherwise without abnormality on                            direct and retroflexion views. Complications:            No immediate complications. Estimated blood loss:                            None. Estimated Blood Loss:     Estimated blood loss: none. Impression:               - Two 6 to 7 mm polyps in the sigmoid colon,                            removed with a cold snare. Resected and retrieved.                           - Internal hemorrhoids.                           - The examination was otherwise normal on direct                            and retroflexion views. Recommendation:           - Repeat colonoscopy in 5 years for surveillance.                            - Patient has a contact number available for                            emergencies. The signs and symptoms of potential                            delayed complications were discussed with the                            patient. Return to normal activities tomorrow.                            Written discharge instructions were provided to the                            patient.                           - Resume previous diet.                           -  Continue present medications.                           - Await pathology results. Ladene Artist, MD 03/26/2018 9:59:10 AM This report has been signed electronically.

## 2018-03-27 ENCOUNTER — Telehealth: Payer: Self-pay

## 2018-03-27 NOTE — Telephone Encounter (Signed)
  Follow up Call-  Call back number 03/26/2018  Post procedure Call Back phone  # [phone]  Permission to leave phone message Yes  Some recent data might be hidden     Patient questions:  Do you have a fever, pain , or abdominal swelling? No. Pain Score  0 *  Have you tolerated food without any problems? No.A little bit nauseated.  Have you been able to return to your normal activities? Yes.    Do you have any questions about your discharge instructions: Diet   No. Medications  No. Follow up visit  No.  Do you have questions or concerns about your Care? No.  Actions: * If pain score is 4 or above: No action needed, pain <4.

## 2018-03-28 ENCOUNTER — Ambulatory Visit
Admission: RE | Admit: 2018-03-28 | Discharge: 2018-03-28 | Disposition: A | Discharge status: Home / Self Care | Payer: 59 | Source: Ambulatory Visit | Attending: Internal Medicine | Admitting: Internal Medicine

## 2018-03-28 DIAGNOSIS — Z1231 Encounter for screening mammogram for malignant neoplasm of breast: Secondary | ICD-10-CM

## 2018-04-01 ENCOUNTER — Encounter: Payer: Self-pay | Admitting: Internal Medicine

## 2018-04-01 ENCOUNTER — Encounter: Payer: Self-pay | Admitting: Gastroenterology

## 2018-04-01 DIAGNOSIS — G4719 Other hypersomnia: Secondary | ICD-10-CM

## 2018-04-01 DIAGNOSIS — G4733 Obstructive sleep apnea (adult) (pediatric): Secondary | ICD-10-CM

## 2018-04-04 ENCOUNTER — Telehealth: Payer: Self-pay | Admitting: *Deleted

## 2018-04-04 DIAGNOSIS — G4733 Obstructive sleep apnea (adult) (pediatric): Secondary | ICD-10-CM

## 2018-04-04 NOTE — Telephone Encounter (Signed)
LMOM asking pt to call back for results of HST. Pt has moderate OSA with AHI of 18, worse in supine position. Order placed and pending until we hear back from pt.

## 2018-04-07 NOTE — Telephone Encounter (Signed)
Pt informed of results. Nothing further needed. 

## 2018-04-21 ENCOUNTER — Ambulatory Visit (HOSPITAL_COMMUNITY): Payer: 59

## 2018-04-23 ENCOUNTER — Ambulatory Visit (HOSPITAL_COMMUNITY)
Admission: RE | Admit: 2018-04-23 | Discharge: 2018-04-23 | Disposition: A | Discharge status: Home / Self Care | Payer: 59 | Source: Ambulatory Visit | Attending: Interventional Radiology | Admitting: Interventional Radiology

## 2018-04-23 DIAGNOSIS — I671 Cerebral aneurysm, nonruptured: Secondary | ICD-10-CM | POA: Insufficient documentation

## 2018-04-23 DIAGNOSIS — I729 Aneurysm of unspecified site: Secondary | ICD-10-CM | POA: Diagnosis not present

## 2018-04-29 DIAGNOSIS — G4733 Obstructive sleep apnea (adult) (pediatric): Secondary | ICD-10-CM | POA: Diagnosis not present

## 2018-05-15 ENCOUNTER — Telehealth (HOSPITAL_COMMUNITY): Payer: Self-pay

## 2018-05-15 NOTE — Telephone Encounter (Signed)
Called pt regarding recent mri, no answer, left message. AW 

## 2018-05-21 ENCOUNTER — Other Ambulatory Visit (HOSPITAL_COMMUNITY): Payer: Self-pay | Admitting: Interventional Radiology

## 2018-05-21 DIAGNOSIS — I729 Aneurysm of unspecified site: Secondary | ICD-10-CM

## 2018-05-29 ENCOUNTER — Encounter: Payer: Self-pay | Admitting: Internal Medicine

## 2018-05-30 ENCOUNTER — Ambulatory Visit (HOSPITAL_COMMUNITY)
Admission: RE | Admit: 2018-05-30 | Discharge: 2018-05-30 | Disposition: A | Discharge status: Home / Self Care | Payer: 59 | Source: Ambulatory Visit | Attending: Interventional Radiology | Admitting: Interventional Radiology

## 2018-05-30 ENCOUNTER — Other Ambulatory Visit: Payer: Self-pay | Admitting: Internal Medicine

## 2018-05-30 DIAGNOSIS — I729 Aneurysm of unspecified site: Secondary | ICD-10-CM

## 2018-05-30 DIAGNOSIS — I671 Cerebral aneurysm, nonruptured: Secondary | ICD-10-CM | POA: Diagnosis not present

## 2018-05-30 DIAGNOSIS — G4733 Obstructive sleep apnea (adult) (pediatric): Secondary | ICD-10-CM | POA: Diagnosis not present

## 2018-05-30 NOTE — Consult Note (Signed)
Chief Complaint: Patient was seen in consultation today for basilar apex aneurysm s/p embolization.  Supervising Physician: Luanne Bras  Patient Status: [facility] - Out-pt  History of Present Illness: Katrina Woods is a 62 y.o. female with a past medical history of hyperlipidemia, intracranial cerebral aneurysms, GERD, hiatal hernia, bronchitis, insomnia, allergic rhinitis, depression, and shingles. She is known to [facility] and has been followed by Dr. Estanislado Pandy. 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 Estanislado Pandy and had this aneurysm re-embolized 05/11/2015 with Dr. Estanislado Pandy. She also has been monitored with routine diagnostic cerebral angiograms and MRI/MRA scans.  MRA head 04/23/2018: 1. Unchanged 2.5 mm area of recurrent flow/recanalization at the neck of the treated basilar tip aneurysm. 2. Unchanged bilateral cavernous ICA aneurysms. 3. No new aneurysm, large vessel occlusion, or flow limiting stenosis identified.  Patient presents today for consultation to discuss her most recent MRA results. Patient awake and alert sitting in chair. Complains of occasional headache with associated dizziness. States these headaches occur once every 2-3 weeks. States this is normal for her, and these headaches are less frequent then they used to be. Complains of diplopia x 3 years, stable at this time. Denies weakness, numbness/tingling, blurred vision, loss of vision, hearing changes, tinnitus, or speech difficulty.  Past Medical History:  Diagnosis Date  . Allergy   . Aneurysm ([place])    cerebral  . Back pain   . Bursitis of left hip 8-13   takes Meloxicam daily   . Depression    takes Lexapro daily  . GERD (gastroesophageal reflux disease)    takes Omeprazole daily  . Headache   . Hiatal hernia 209  . History of bronchitis 2015  . History of colon polyps    benign  . History of kidney stones   . History of shingles   . Hyperlipidemia    takes  Atorvastatin daily  . Insomnia    takes Xanax nightly  . Weakness    numbness and tingling-thinks its carpal tunnel    Past Surgical History:  Procedure Laterality Date  . ABDOMINAL HYSTERECTOMY  2004   total  . CEREBRAL ANEURYSM REPAIR  2006   Deveshwar, coil   . CESAREAN SECTION  11-25-82  . COLONOSCOPY    . ESOPHAGOGASTRODUODENOSCOPY    . IR GENERIC HISTORICAL  10/23/2016   IR RADIOLOGIST EVAL & MGMT 10/23/2016 [facility]-INTERV RAD  . KNEE CARTILAGE SURGERY Left 1987  . POLYPECTOMY    . RADIOLOGY WITH ANESTHESIA N/A 05/11/2015   Procedure: ANEURYSM EMBOLIZATION;  Surgeon: Luanne Bras, MD;  Location: [place];  Service: Radiology;  Laterality: N/A;  . [place]   all four extracted    Allergies: Etodolac; Pollen extract; and Statins  Medications: Prior to Admission medications   Medication Sig Start Date End Date Taking? Authorizing Provider  ALPRAZolam Duanne Moron) 1 MG tablet TAKE 1 TABLET BY MOUTH AT BEDTIME AS NEEDED FOR ANXIETY OR SLEEP 11/22/17   Crecencio Mc, MD  atorvastatin (LIPITOR) 20 MG tablet Take 1 tablet (20 mg total) daily by mouth. 10/17/17   Crecencio Mc, MD  escitalopram (LEXAPRO) 10 MG tablet TAKE 1 TABLET BY MOUTH EVERY DAY 05/30/18   Crecencio Mc, MD  ibuprofen (ADVIL,MOTRIN) 200 MG tablet Take 800 mg by mouth every 6 (six) hours as needed for headache.    [provider]  meloxicam (MOBIC) 15 MG tablet Take 1 tablet (15 mg total) daily by mouth. 10/28/17   Derrel Nip, Aris Everts,  MD  metaxalone (SKELAXIN) 800 MG tablet Take 1 tablet (800 mg total) by mouth 3 (three) times daily. 12/20/16   Crecencio Mc, MD     Family History  Problem Relation Age of Onset  . Diabetes Mother        type 2  . Hypertension Mother   . Heart disease Mother        CHF  . Cancer Mother        lung- previous smoker- had quit 12 yrs before  . Appendicitis Father        acute  . Cancer Father        prostate?  . Diabetes Brother 45       type 2    . Hypertension Brother   . Cancer Maternal Grandmother        breast, uterine  . Hypertension Maternal Grandmother   . Heart disease Maternal Grandmother        CHF  . Diabetes Maternal Grandmother   . Stroke Maternal Grandfather   . Heart block Paternal Grandfather        massive  . Colon cancer Neg Hx   . Stomach cancer Neg Hx   . Colon polyps Neg Hx   . Esophageal cancer Neg Hx   . Rectal cancer Neg Hx     Social History   Socioeconomic History  . Marital status: Single    Spouse name: Not on file  . Number of children: Not on file  . Years of education: Not on file  . Highest education level: Not on file  Occupational History  . Not on file  Social Needs  . Financial resource strain: Not on file  . Food insecurity:    Worry: Not on file    Inability: Not on file  . Transportation needs:    Medical: Not on file    Non-medical: Not on file  Tobacco Use  . Smoking status: Former Smoker    Types: Cigarettes  . Smokeless tobacco: Never Used  . Tobacco comment: quit smoking in Nov 2015  Substance and Sexual Activity  . Alcohol use: Yes    Alcohol/week: 1.8 oz    Types: 3 Glasses of wine per week    Comment: occasional wine  . Drug use: No  . Sexual activity: Not Currently  Lifestyle  . Physical activity:    Days per week: Not on file    Minutes per session: Not on file  . Stress: Not on file  Relationships  . Social connections:    Talks on phone: Not on file    Gets together: Not on file    Attends religious service: Not on file    Active member of club or organization: Not on file    Attends meetings of clubs or organizations: Not on file    Relationship status: Not on file  Other Topics Concern  . Not on file  Social History Narrative  . Not on file     Review of Systems: A 12 point ROS discussed and pertinent positives are indicated in the HPI above.  All other systems are negative.  Review of Systems  Constitutional: Negative for chills and  fever.  HENT: Negative for hearing loss and tinnitus.   Eyes: Positive for visual disturbance.  Respiratory: Negative for shortness of breath and wheezing.   Cardiovascular: Negative for chest pain and palpitations.  Neurological: Positive for dizziness and headaches. Negative for speech difficulty, weakness and numbness.  Psychiatric/Behavioral:  Negative for behavioral problems and confusion.    Vital Signs: There were no vitals taken for this visit.  Physical Exam  Constitutional: She is oriented to person, place, and time. She appears well-developed and well-nourished. No distress.  Pulmonary/Chest: Effort normal. No respiratory distress.  Neurological: She is alert and oriented to person, place, and time.  Skin: Skin is warm and dry.  Psychiatric: She has a normal mood and affect. Her behavior is normal. Judgment and thought content normal.  Nursing note and vitals reviewed.    Imaging: No results found.  Labs:  CBC: Recent Labs    10/14/17 0944  WBC 7.2  HGB 15.7*  HCT 47.3*  PLT 209.0    COAGS: No results for input(s): INR, APTT in the last 8760 hours.  BMP: Recent Labs    10/14/17 0944  NA 136  K 4.4  CL 103  CO2 24  GLUCOSE 102*  BUN 17  CALCIUM 10.3  CREATININE 0.84    LIVER FUNCTION TESTS: Recent Labs    10/14/17 0944  BILITOT 0.5  AST 18  ALT 11  ALKPHOS 61  PROT 7.4  ALBUMIN 4.3    TUMOR MARKERS: No results for input(s): AFPTM, CEA, CA199, CHROMGRNA in the last 8760 hours.  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 Estanislado Pandy. Reviewed imaging with patient. Showed patient location of recurrent flow/recanalization at the neck of her basilar apex aneurysm. Informed patient that there are two management options for this recanalization, including treatment with more coils or routine monitoring with image scans. Dr. Estanislado Pandy explained to patient that since she is not  having symptoms and the recanalization is stable at this time based on imaging scans, he feels that the best management option would be with routine image monitoring.  Plan for follow-up with MRA head w/o contrast in 1 year. Informed patient that our schedulers will call her to set up this imaging scan. Instructed patient that if she notices any new neurological symptoms to head to the ED right away.  Instructed patient not to smoke. Instructed patient to keep her high cholesterol well managed. Instructed patient to exercise regularly.  All questions answered and concerns addressed. Patient conveys understanding and agrees with plan.  Thank you for this interesting consult.  I greatly enjoyed meeting Katrina Woods and look forward to participating in their care.  A copy of this report was sent to the requesting provider on this date.  Electronically Signed: Earley Abide, PA-C 05/30/2018, 10:16 AM   I spent a total of 25 Minutes in face to face in clinical consultation, greater than 50% of which was counseling/coordinating care for basilar apex aneurysm s/p embolization.

## 2018-06-03 DIAGNOSIS — G4733 Obstructive sleep apnea (adult) (pediatric): Secondary | ICD-10-CM | POA: Diagnosis not present

## 2018-06-25 ENCOUNTER — Ambulatory Visit: Payer: 59 | Admitting: Internal Medicine

## 2018-06-25 ENCOUNTER — Encounter: Payer: Self-pay | Admitting: Internal Medicine

## 2018-06-25 VITALS — BP 122/80 | HR 61 | Resp 16 | Ht 62.0 in | Wt 130.0 lb

## 2018-06-25 DIAGNOSIS — G4733 Obstructive sleep apnea (adult) (pediatric): Secondary | ICD-10-CM | POA: Diagnosis not present

## 2018-06-25 NOTE — Patient Instructions (Signed)
Continue OSA as prescribed

## 2018-06-25 NOTE — Progress Notes (Signed)
Name: Katrina Woods MRN: [id number] DOB: [birth year]     CONSULTATION DATE: 4.4.19 REFERRING MD : Derrel Nip  CHIEF COMPLAINT: snoring  STUDIES:    03/2017 CXR independently reviewed by Me No acute pneumonia No effusions  HISTORY OF PRESENT ILLNESS: Follow up OSA  AHI 18 AHI 49 in supine position  Has been on AUTOCPAP therapy for last 2 months Doing well less fatigued and tired   No signs of infection, no signs of CHF Remote history of smoking No weight loss  Yoga teacher Very active     PAST MEDICAL HISTORY :   has a past medical history of Allergy, Aneurysm ([place]), Back pain, Bursitis of left hip (8-13), Depression, GERD (gastroesophageal reflux disease), Headache, Hiatal hernia (209), History of bronchitis (2015), History of colon polyps, History of kidney stones, History of shingles, Hyperlipidemia, Insomnia, and Weakness.  has a past surgical history that includes Abdominal hysterectomy (2004); Knee cartilage surgery (Left, 1987); Cesarean section (11-25-82); Wisdom tooth extraction (1986); Cerebral aneurysm repair (2006); Colonoscopy; Esophagogastroduodenoscopy; Radiology with anesthesia (N/A, 05/11/2015); ir generic historical (10/23/2016); and Polypectomy. Prior to Admission medications   Medication Sig Start Date End Date Taking? Authorizing Provider  ALPRAZolam Duanne Moron) 1 MG tablet TAKE 1 TABLET BY MOUTH AT BEDTIME AS NEEDED FOR ANXIETY OR SLEEP 11/22/17  Yes Crecencio Mc, MD  atorvastatin (LIPITOR) 20 MG tablet Take 1 tablet (20 mg total) daily by mouth. 10/17/17  Yes Crecencio Mc, MD  escitalopram (LEXAPRO) 10 MG tablet Take 1 tablet (10 mg total) by mouth daily. 11/15/17  Yes Crecencio Mc, MD  ibuprofen (ADVIL,MOTRIN) 200 MG tablet Take 800 mg by mouth every 6 (six) hours as needed for headache.   Yes [provider]  meloxicam (MOBIC) 15 MG tablet Take 1 tablet (15 mg total) daily by mouth. 10/28/17  Yes Crecencio Mc, MD  metaxalone  (SKELAXIN) 800 MG tablet Take 1 tablet (800 mg total) by mouth 3 (three) times daily. 12/20/16  Yes Crecencio Mc, MD   Allergies  Allergen Reactions  . Etodolac     Other reaction(s): Other (See Comments)  . Pollen Extract     Other reaction(s): Unknown  . Statins     Muscle aches- states is doing fine with Atorvastatin     REVIEW OF SYSTEMS:   Constitutional: Negative for fever, chills, weight loss, +malaise/fatigue and diaphoresis.  HENT: Negative for hearing loss, ear pain, nosebleeds, congestion, sore throat, neck pain, tinnitus and ear discharge.   Eyes: Negative for blurred vision, double vision, photophobia, pain, discharge and redness.  Respiratory: Negative for cough, hemoptysis, sputum production, shortness of breath, wheezing and stridor.   Cardiovascular: Negative for chest pain, palpitations, orthopnea, claudication, leg swelling and PND.  ALL OTHER ROS ARE NEGATIVE    BP 122/80 (BP Location: Left Arm, Cuff Size: Normal)   Pulse 61   Resp 16   Ht 5\' 2"  (1.575 m)   Wt 130 lb (59 kg)   SpO2 97%   BMI 23.78 kg/m    Physical Examination:   GENERAL:NAD, no fevers, chills, no weakness no fatigue HEAD: Normocephalic, atraumatic.  EYES: Pupils equal, round, reactive to light. Extraocular muscles intact. No scleral icterus.  MOUTH: Moist mucosal membrane.   EAR, NOSE, THROAT: Clear without exudates. No external lesions.  NECK: Supple. No thyromegaly. No nodules. No JVD.  PULMONARY:CTA B/L no wheezes, no crackles, no rhonchi CARDIOVASCULAR: S1 and S2. Regular rate and rhythm. No murmurs, rubs, or gallops. No edema.  PSYCHIATRIC: Mood, affect within normal limits. The patient is awake, alert and oriented x 3. Insight, judgment intact.      ASSESSMENT / PLAN: 62 yo pleasant white female seen today for excessive daytime sleepiness with fatigue, signs and symptoms related to underlying severe OSA   Patient needs to continue AutoCPAP therapy 5-20 cm h20 Patient  using and benefiting from usage.  Follow up in 1 year     Patient  satisfied with Plan of action and management. All questions answered  Corrin Parker, M.D.  [facility]  Medical Director [place] Director [facility] Cardio-Pulmonary Department

## 2018-06-29 DIAGNOSIS — G4733 Obstructive sleep apnea (adult) (pediatric): Secondary | ICD-10-CM | POA: Diagnosis not present

## 2018-07-30 DIAGNOSIS — G4733 Obstructive sleep apnea (adult) (pediatric): Secondary | ICD-10-CM | POA: Diagnosis not present

## 2018-08-30 DIAGNOSIS — G4733 Obstructive sleep apnea (adult) (pediatric): Secondary | ICD-10-CM | POA: Diagnosis not present

## 2018-09-29 DIAGNOSIS — G4733 Obstructive sleep apnea (adult) (pediatric): Secondary | ICD-10-CM | POA: Diagnosis not present

## 2018-10-22 ENCOUNTER — Encounter: Payer: Self-pay | Admitting: Internal Medicine

## 2018-10-22 ENCOUNTER — Ambulatory Visit (INDEPENDENT_AMBULATORY_CARE_PROVIDER_SITE_OTHER): Payer: 59 | Admitting: Internal Medicine

## 2018-10-22 VITALS — BP 114/68 | HR 69 | Temp 97.8°F | Resp 18 | Ht 62.0 in | Wt 135.4 lb

## 2018-10-22 DIAGNOSIS — G4733 Obstructive sleep apnea (adult) (pediatric): Secondary | ICD-10-CM | POA: Diagnosis not present

## 2018-10-22 DIAGNOSIS — K760 Fatty (change of) liver, not elsewhere classified: Secondary | ICD-10-CM

## 2018-10-22 DIAGNOSIS — F418 Other specified anxiety disorders: Secondary | ICD-10-CM

## 2018-10-22 DIAGNOSIS — I671 Cerebral aneurysm, nonruptured: Secondary | ICD-10-CM

## 2018-10-22 DIAGNOSIS — Z0000 Encounter for general adult medical examination without abnormal findings: Secondary | ICD-10-CM | POA: Diagnosis not present

## 2018-10-22 DIAGNOSIS — Z23 Encounter for immunization: Secondary | ICD-10-CM

## 2018-10-22 DIAGNOSIS — Z1239 Encounter for other screening for malignant neoplasm of breast: Secondary | ICD-10-CM | POA: Diagnosis not present

## 2018-10-22 DIAGNOSIS — E785 Hyperlipidemia, unspecified: Secondary | ICD-10-CM

## 2018-10-22 DIAGNOSIS — Z9989 Dependence on other enabling machines and devices: Secondary | ICD-10-CM

## 2018-10-22 MED ORDER — ALPRAZOLAM 1 MG PO TABS: ORAL_TABLET | ORAL | 5 refills | Status: DC

## 2018-10-22 MED ORDER — MELOXICAM 15 MG PO TABS: 15.0000 mg | ORAL_TABLET | Freq: Every day | ORAL | 1 refills | Status: DC

## 2018-10-22 MED ORDER — ESCITALOPRAM OXALATE 10 MG PO TABS: 10.0000 mg | ORAL_TABLET | Freq: Every day | ORAL | 1 refills | Status: DC

## 2018-10-22 MED ORDER — EZETIMIBE 10 MG PO TABS: 10.0000 mg | ORAL_TABLET | Freq: Every day | ORAL | 3 refills | Status: DC

## 2018-10-22 NOTE — Progress Notes (Signed)
Patient ID: Katrina Woods, female    DOB: 06/30/1956  Age: 62 y.o. MRN: [id number]  The patient is here for annual preventive  examination and management of other chronic and acute problems. Last seen one year ago.    colonoscopy April 2019 : tubular adenoma  No dysplasia . 5 yr follow up  PAP NEGATIVE 2018   The risk factors are reflected in the social history.  The roster of all physicians providing medical care to patient - is listed in the Snapshot section of the chart.  Activities of daily living:  The patient is 100% independent in all ADLs: dressing, toileting, feeding as well as independent mobility  Home safety : The patient has smoke detectors in the home. They wear seatbelts.  There are no firearms at home. There is no violence in the home.   There is no risks for hepatitis, STDs or HIV. There is no   history of blood transfusion. They have no travel history to infectious disease endemic areas of the world.  The patient has seen their dentist in the last six month. They have seen their eye doctor in the last year. They admit to slight hearing difficulty with regard to whispered voices and some television programs.  They have deferred audiologic testing in the last year.  They do not  have excessive sun exposure. Discussed the need for sun protection: hats, long sleeves and use of sunscreen if there is significant sun exposure.   Diet: the importance of a healthy diet is discussed. They do have a healthy diet.  The benefits of regular aerobic exercise were discussed. She practices and teaches yoga daily .  Depression screen: there are no signs or vegative symptoms of depression- irritability, change in appetite, anhedonia, sadness/tearfullness.  Cognitive assessment: the patient manages all their financial and personal affairs and is actively engaged. They could relate day,date,year and events; recalled 2/3 objects at 3 minutes; performed clock-face test normally.  The  following portions of the patient's history were reviewed and updated as appropriate: allergies, current medications, past family history, past medical history,  past surgical history, past social history  and problem list.  Visual acuity was not assessed per patient preference since she has regular follow up with her ophthalmologist. Hearing and body mass index were assessed and reviewed.   During the course of the visit the patient was educated and counseled about appropriate screening and preventive services including : fall prevention , diabetes screening, nutrition counseling, colorectal cancer screening, and recommended immunizations.    CC: The primary encounter diagnosis was Hyperlipidemia LDL goal <100. Diagnoses of OSA on CPAP, Fatty infiltration of liver, Need for immunization against influenza, Aneurysm, cerebral, nonruptured, Encounter for preventive health examination, Depression with anxiety, and Breast cancer screening were also pertinent to this visit.  1) OSA diagnosed  in May 2019 with sleep study , managed by Pulmonology Mortimer Fries).  States that wore it diligently for the first month to ensure coverage by insurance,  But she admits that she has not sleeping well when she tires to wear the CPAP  bc she is a side sleeper, so she is not wearing it regularly because so difficult to tolerate.   2) not taking statin due to joint aches.   Discussed trial of zetia   3) History of cerebral aneurysm of basilar artery,  Treated.   MRI/MRA of brain was repeated  by [facility] in May .unchanged area of recurrent flow/recanalization at the neck of the basilar artery treated  aneurysm   And  Unchanged bilateral cavernous ICA aneurysms noted,   Repeat scan in one year    History Katrina Woods has a past medical history of Allergy, Aneurysm ([place]), Back pain, Bursitis of left hip (8-13), Depression, GERD (gastroesophageal reflux disease), Headache, Hiatal hernia (209), History of bronchitis (2015), History of  colon polyps, History of kidney stones, History of shingles, Hyperlipidemia, Insomnia, and Weakness.   She has a past surgical history that includes Abdominal hysterectomy (2004); Knee cartilage surgery (Left, 1987); Cesarean section (11-25-82); Wisdom tooth extraction (1986); Cerebral aneurysm repair (2006); Colonoscopy; Esophagogastroduodenoscopy; Radiology with anesthesia (N/A, 05/11/2015); ir generic historical (10/23/2016); and Polypectomy.   Her family history includes Appendicitis in her father; Cancer in her father, maternal grandmother, and mother; Diabetes in her maternal grandmother and mother; Diabetes (age of onset: 13) in her brother; Heart block in her paternal grandfather; Heart disease in her maternal grandmother and mother; Hypertension in her brother, maternal grandmother, and mother; Stroke in her maternal grandfather.She reports that she has quit smoking. Her smoking use included cigarettes. She has never used smokeless tobacco. She reports that she drinks about 3.0 standard drinks of alcohol per week. She reports that she does not use drugs.  Outpatient Medications Prior to Visit  Medication Sig Dispense Refill  . ibuprofen (ADVIL,MOTRIN) 200 MG tablet Take 800 mg by mouth every 6 (six) hours as needed for headache.    . ALPRAZolam (XANAX) 1 MG tablet TAKE 1 TABLET BY MOUTH AT BEDTIME AS NEEDED FOR ANXIETY OR SLEEP 30 tablet 5  . atorvastatin (LIPITOR) 20 MG tablet Take 1 tablet (20 mg total) daily by mouth. 90 tablet 3  . meloxicam (MOBIC) 15 MG tablet Take 1 tablet (15 mg total) daily by mouth. 30 tablet 3  . escitalopram (LEXAPRO) 10 MG tablet TAKE 1 TABLET BY MOUTH EVERY DAY (Patient not taking: Reported on 10/22/2018) 90 tablet 1  . metaxalone (SKELAXIN) 800 MG tablet Take 1 tablet (800 mg total) by mouth 3 (three) times daily. (Patient not taking: Reported on 10/22/2018) 90 tablet 1   Facility-Administered Medications Prior to Visit  Medication Dose Route Frequency Provider  Last Rate Last Dose  . 0.9 %  sodium chloride infusion  500 mL Intravenous Once Ladene Artist, MD        Review of Systems   Patient denies headache, fevers, malaise, unintentional weight loss, skin rash, eye pain, sinus congestion and sinus pain, sore throat, dysphagia,  hemoptysis , cough, dyspnea, wheezing, chest pain, palpitations, orthopnea, edema, abdominal pain, nausea, melena, diarrhea, constipation, flank pain, dysuria, hematuria, urinary  Frequency, nocturia, numbness, tingling, seizures,  Focal weakness, Loss of consciousness,  Tremor, insomnia, depression, anxiety, and suicidal ideation.      Objective:  BP 114/68 (BP Location: Left Arm, Patient Position: Sitting, Cuff Size: Normal)   Pulse 69   Temp 97.8 F (36.6 C) (Oral)   Resp 18   Ht 5\' 2"  (1.575 m)   Wt 135 lb 6 oz (61.4 kg)   SpO2 99%   BMI 24.76 kg/m   Physical Exam   General appearance: alert, cooperative and appears stated age Head: Normocephalic, without obvious abnormality, atraumatic Eyes: conjunctivae/corneas clear. PERRL, EOM's intact. Fundi benign. Ears: normal TM's and external ear canals both ears Nose: Nares normal. Septum midline. Mucosa normal. No drainage or sinus tenderness. Throat: lips, mucosa, and tongue normal; teeth and gums normal Neck: no adenopathy, no carotid bruit, no JVD, supple, symmetrical, trachea midline and thyroid not enlarged, symmetric, no tenderness/mass/nodules  Lungs: clear to auscultation bilaterally Breasts: normal appearance, no masses or tenderness Heart: regular rate and rhythm, S1, S2 normal, no murmur, click, rub or gallop Abdomen: soft, non-tender; bowel sounds normal; no masses,  no organomegaly Extremities: extremities normal, atraumatic, no cyanosis or edema Pulses: 2+ and symmetric Skin: Skin color, texture, turgor normal. No rashes or lesions Neurologic: Alert and oriented X 3, normal strength and tone. Normal symmetric reflexes. Normal coordination and  gait.      Assessment & Plan:   Problem List Items Addressed This Visit    Aneurysm, cerebral, nonruptured    MRI/MRA of brain was repeated  by [facility] in May .unchanged area of recurrent flow/recanalization at the neck of the basilar artery treated aneurysm   And  Unchanged bilateral cavernous ICA aneurysms noted,   Repeat scan in one year        Relevant Medications   ezetimibe (ZETIA) 10 MG tablet   Depression with anxiety    Improved with SSRI therapy (Lexapro) and prn alprazolam. The risks and benefits of benzodiazepine use were reviewed with patient today including excessive sedation leading to respiratory depression,  impaired thinking/driving, and addiction.  Patient was advised to avoid concurrent use with alcohol, to use medication only as needed and not to share with others  .       Relevant Medications   ALPRAZolam (XANAX) 1 MG tablet   escitalopram (LEXAPRO) 10 MG tablet   Encounter for preventive health examination    Annual comprehensive preventive exam was done as well as an evaluation and management of chronic conditions .  During the course of the visit the patient was educated and counseled about appropriate screening and preventive services including :  diabetes screening, lipid analysis with projected  10 year  risk for CAD , nutrition counseling, breast, cervical and colorectal cancer screening, and recommended immunizations.  Printed recommendations for health maintenance screenings was given       Fatty infiltration of liver   Relevant Orders   Comprehensive metabolic panel   Hyperlipidemia LDL goal <100 - Primary    Given her history of tobacco abuse  , her 10 year risk of coronary artery disease is elevated and supported by the presence of atherosclerosis is also noted on plain films.  She did not tolerate atorvastatin due to joint pain, but is willing to try Zetia.  She will return for baseline fasting labs .   Lab Results  Component Value Date   CHOL  257 (H) 10/14/2017   HDL 61.50 10/14/2017   LDLCALC 171 (H) 10/14/2017   LDLDIRECT 166.0 10/14/2017   TRIG 124.0 10/14/2017   CHOLHDL 4 10/14/2017         Relevant Medications   ezetimibe (ZETIA) 10 MG tablet   Other Relevant Orders   TSH   Lipid panel   OSA on CPAP    Diagnosed by sleep study. She is not tolerating her C PAP despite wearing every night a minimum of 6 hours due to preferred side sleeping position.  Advised to contact Dr Mortimer Fries for alternatives ([place])       Other Visit Diagnoses    Need for immunization against influenza       Relevant Orders   Flu Vaccine QUAD 36+ mos IM (Completed)   Breast cancer screening       Relevant Orders   MM 3D SCREEN BREAST BILATERAL      I have discontinued Verne Carrow. Sumpter's metaxalone and atorvastatin. I  have also changed her escitalopram and meloxicam. Additionally, I am having her start on ezetimibe. Lastly, I am having her maintain her ibuprofen and ALPRAZolam. We will continue to administer sodium chloride.  Meds ordered this encounter  Medications  . ALPRAZolam (XANAX) 1 MG tablet    Sig: TAKE 1 TABLET BY MOUTH AT BEDTIME AS NEEDED FOR ANXIETY OR SLEEP    Dispense:  30 tablet    Refill:  5    This request is for a new prescription for a controlled substance as required by Federal/State law.  . escitalopram (LEXAPRO) 10 MG tablet    Sig: Take 1 tablet (10 mg total) by mouth daily.    Dispense:  90 tablet    Refill:  1  . ezetimibe (ZETIA) 10 MG tablet    Sig: Take 1 tablet (10 mg total) by mouth daily.    Dispense:  30 tablet    Refill:  3  . meloxicam (MOBIC) 15 MG tablet    Sig: Take 1 tablet (15 mg total) by mouth daily.    Dispense:  90 tablet    Refill:  1    Medications Discontinued During This Encounter  Medication Reason  . ALPRAZolam (XANAX) 1 MG tablet Reorder  . escitalopram (LEXAPRO) 10 MG tablet Reorder  . atorvastatin (LIPITOR) 20 MG tablet   . metaxalone (SKELAXIN) 800 MG  tablet   . meloxicam (MOBIC) 15 MG tablet Reorder    Follow-up: No follow-ups on file.   Crecencio Mc, MD

## 2018-10-22 NOTE — Assessment & Plan Note (Addendum)
Diagnosed by sleep study. She is not tolerating her C PAP despite wearing every night a minimum of 6 hours due to preferred side sleeping position.  Advised to contact Dr Mortimer Fries for alternatives ([place])

## 2018-10-22 NOTE — Patient Instructions (Addendum)
I have refilled your medications  I recommend seeing Dr Mortimer Fries again to discuss alternative CPAP apparatus if the alprazolam does not help you tolerate the mask better  Trial of zetia.  Do not start until you have fasting labs   Return for fasting labs ( 4 hours needed)   Health Maintenance for Postmenopausal Women Menopause is a normal process in which your reproductive ability comes to an end. This process happens gradually over a span of months to years, usually between the ages of 14 and 95. Menopause is complete when you have missed 12 consecutive menstrual periods. It is important to talk with your health care provider about some of the most common conditions that affect postmenopausal women, such as heart disease, cancer, and bone loss (osteoporosis). Adopting a healthy lifestyle and getting preventive care can help to promote your health and wellness. Those actions can also lower your chances of developing some of these common conditions. What should I know about menopause? During menopause, you may experience a number of symptoms, such as:  Moderate-to-severe hot flashes.  Night sweats.  Decrease in sex drive.  Mood swings.  Headaches.  Tiredness.  Irritability.  Memory problems.  Insomnia.  Choosing to treat or not to treat menopausal changes is an individual decision that you make with your health care provider. What should I know about hormone replacement therapy and supplements? Hormone therapy products are effective for treating symptoms that are associated with menopause, such as hot flashes and night sweats. Hormone replacement carries certain risks, especially as you become older. If you are thinking about using estrogen or estrogen with progestin treatments, discuss the benefits and risks with your health care provider. What should I know about heart disease and stroke? Heart disease, heart attack, and stroke become more likely as you age. This may be due, in  part, to the hormonal changes that your body experiences during menopause. These can affect how your body processes dietary fats, triglycerides, and cholesterol. Heart attack and stroke are both medical emergencies. There are many things that you can do to help prevent heart disease and stroke:  Have your blood pressure checked at least every 1-2 years. High blood pressure causes heart disease and increases the risk of stroke.  If you are 71-8 years old, ask your health care provider if you should take aspirin to prevent a heart attack or a stroke.  Do not use any tobacco products, including cigarettes, chewing tobacco, or electronic cigarettes. If you need help quitting, ask your health care provider.  It is important to eat a healthy diet and maintain a healthy weight. ? Be sure to include plenty of vegetables, fruits, low-fat dairy products, and lean protein. ? Avoid eating foods that are high in solid fats, added sugars, or salt (sodium).  Get regular exercise. This is one of the most important things that you can do for your health. ? Try to exercise for at least 150 minutes each week. The type of exercise that you do should increase your heart rate and make you sweat. This is known as moderate-intensity exercise. ? Try to do strengthening exercises at least twice each week. Do these in addition to the moderate-intensity exercise.  Know your numbers.Ask your health care provider to check your cholesterol and your blood glucose. Continue to have your blood tested as directed by your health care provider.  What should I know about cancer screening? There are several types of cancer. Take the following steps to reduce your risk  and to catch any cancer development as early as possible. Breast Cancer  Practice breast self-awareness. ? This means understanding how your breasts normally appear and feel. ? It also means doing regular breast self-exams. Let your health care provider know about  any changes, no matter how small.  If you are 65 or older, have a clinician do a breast exam (clinical breast exam or CBE) every year. Depending on your age, family history, and medical history, it may be recommended that you also have a yearly breast X-ray (mammogram).  If you have a family history of breast cancer, talk with your health care provider about genetic screening.  If you are at high risk for breast cancer, talk with your health care provider about having an MRI and a mammogram every year.  Breast cancer (BRCA) gene test is recommended for women who have family members with BRCA-related cancers. Results of the assessment will determine the need for genetic counseling and BRCA1 and for BRCA2 testing. BRCA-related cancers include these types: ? Breast. This occurs in males or females. ? Ovarian. ? Tubal. This may also be called fallopian tube cancer. ? Cancer of the abdominal or pelvic lining (peritoneal cancer). ? Prostate. ? Pancreatic.  Cervical, Uterine, and Ovarian Cancer Your health care provider may recommend that you be screened regularly for cancer of the pelvic organs. These include your ovaries, uterus, and vagina. This screening involves a pelvic exam, which includes checking for microscopic changes to the surface of your cervix (Pap test).  For women ages 21-65, health care providers may recommend a pelvic exam and a Pap test every three years. For women ages 32-65, they may recommend the Pap test and pelvic exam, combined with testing for human papilloma virus (HPV), every five years. Some types of HPV increase your risk of cervical cancer. Testing for HPV may also be done on women of any age who have unclear Pap test results.  Other health care providers may not recommend any screening for nonpregnant women who are considered low risk for pelvic cancer and have no symptoms. Ask your health care provider if a screening pelvic exam is right for you.  If you have had  past treatment for cervical cancer or a condition that could lead to cancer, you need Pap tests and screening for cancer for at least 20 years after your treatment. If Pap tests have been discontinued for you, your risk factors (such as having a new sexual partner) need to be reassessed to determine if you should start having screenings again. Some women have medical problems that increase the chance of getting cervical cancer. In these cases, your health care provider may recommend that you have screening and Pap tests more often.  If you have a family history of uterine cancer or ovarian cancer, talk with your health care provider about genetic screening.  If you have vaginal bleeding after reaching menopause, tell your health care provider.  There are currently no reliable tests available to screen for ovarian cancer.  Lung Cancer Lung cancer screening is recommended for adults 48-15 years old who are at high risk for lung cancer because of a history of smoking. A yearly low-dose CT scan of the lungs is recommended if you:  Currently smoke.  Have a history of at least 30 pack-years of smoking and you currently smoke or have quit within the past 15 years. A pack-year is smoking an average of one pack of cigarettes per day for one year.  Yearly screening  should:  Continue until it has been 15 years since you quit.  Stop if you develop a health problem that would prevent you from having lung cancer treatment.  Colorectal Cancer  This type of cancer can be detected and can often be prevented.  Routine colorectal cancer screening usually begins at age 44 and continues through age 60.  If you have risk factors for colon cancer, your health care provider may recommend that you be screened at an earlier age.  If you have a family history of colorectal cancer, talk with your health care provider about genetic screening.  Your health care provider may also recommend using home test kits to  check for hidden blood in your stool.  A small camera at the end of a tube can be used to examine your colon directly (sigmoidoscopy or colonoscopy). This is done to check for the earliest forms of colorectal cancer.  Direct examination of the colon should be repeated every 5-10 years until age 76. However, if early forms of precancerous polyps or small growths are found or if you have a family history or genetic risk for colorectal cancer, you may need to be screened more often.  Skin Cancer  Check your skin from head to toe regularly.  Monitor any moles. Be sure to tell your health care provider: ? About any new moles or changes in moles, especially if there is a change in a mole's shape or color. ? If you have a mole that is larger than the size of a pencil eraser.  If any of your family members has a history of skin cancer, especially at a young age, talk with your health care provider about genetic screening.  Always use sunscreen. Apply sunscreen liberally and repeatedly throughout the day.  Whenever you are outside, protect yourself by wearing long sleeves, pants, a wide-brimmed hat, and sunglasses.  What should I know about osteoporosis? Osteoporosis is a condition in which bone destruction happens more quickly than new bone creation. After menopause, you may be at an increased risk for osteoporosis. To help prevent osteoporosis or the bone fractures that can happen because of osteoporosis, the following is recommended:  If you are 33-17 years old, get at least 1,000 mg of calcium and at least 600 mg of vitamin D per day.  If you are older than age 46 but younger than age 82, get at least 1,200 mg of calcium and at least 600 mg of vitamin D per day.  If you are older than age 4, get at least 1,200 mg of calcium and at least 800 mg of vitamin D per day.  Smoking and excessive alcohol intake increase the risk of osteoporosis. Eat foods that are rich in calcium and vitamin D, and  do weight-bearing exercises several times each week as directed by your health care provider. What should I know about how menopause affects my mental health? Depression may occur at any age, but it is more common as you become older. Common symptoms of depression include:  Low or sad mood.  Changes in sleep patterns.  Changes in appetite or eating patterns.  Feeling an overall lack of motivation or enjoyment of activities that you previously enjoyed.  Frequent crying spells.  Talk with your health care provider if you think that you are experiencing depression. What should I know about immunizations? It is important that you get and maintain your immunizations. These include:  Tetanus, diphtheria, and pertussis (Tdap) booster vaccine.  Influenza every year  before the flu season begins.  Pneumonia vaccine.  Shingles vaccine.  Your health care provider may also recommend other immunizations. This information is not intended to replace advice given to you by your health care provider. Make sure you discuss any questions you have with your health care provider. Document Released: 01/18/2006 Document Revised: 06/15/2016 Document Reviewed: 08/30/2015 Elsevier Interactive Patient Education  2018 [employer].

## 2018-10-24 ENCOUNTER — Encounter: Payer: Self-pay | Admitting: Internal Medicine

## 2018-10-24 NOTE — Assessment & Plan Note (Signed)
MRI/MRA of brain was repeated  by [facility] in May .unchanged area of recurrent flow/recanalization at the neck of the basilar artery treated aneurysm   And  Unchanged bilateral cavernous ICA aneurysms noted,   Repeat scan in one year

## 2018-10-24 NOTE — Assessment & Plan Note (Addendum)
Given her history of tobacco abuse  , her 10 year risk of coronary artery disease is elevated and supported by the presence of atherosclerosis is also noted on plain films.  She did not tolerate atorvastatin due to joint pain, but is willing to try Zetia.  She will return for baseline fasting labs .   Lab Results  Component Value Date   CHOL 257 (H) 10/14/2017   HDL 61.50 10/14/2017   LDLCALC 171 (H) 10/14/2017   LDLDIRECT 166.0 10/14/2017   TRIG 124.0 10/14/2017   CHOLHDL 4 10/14/2017

## 2018-10-24 NOTE — Assessment & Plan Note (Signed)
Improved with SSRI therapy (Lexapro) and prn alprazolam. The risks and benefits of benzodiazepine use were reviewed with patient today including excessive sedation leading to respiratory depression,  impaired thinking/driving, and addiction.  Patient was advised to avoid concurrent use with alcohol, to use medication only as needed and not to share with others  .

## 2018-10-24 NOTE — Assessment & Plan Note (Signed)
Annual comprehensive preventive exam was done as well as an evaluation and management of chronic conditions .  During the course of the visit the patient was educated and counseled about appropriate screening and preventive services including :  diabetes screening, lipid analysis with projected  10 year  risk for CAD , nutrition counseling, breast, cervical and colorectal cancer screening, and recommended immunizations.  Printed recommendations for health maintenance screenings was given 

## 2018-10-29 ENCOUNTER — Other Ambulatory Visit: Payer: 59

## 2018-10-30 DIAGNOSIS — G4733 Obstructive sleep apnea (adult) (pediatric): Secondary | ICD-10-CM | POA: Diagnosis not present

## 2018-10-31 ENCOUNTER — Other Ambulatory Visit (INDEPENDENT_AMBULATORY_CARE_PROVIDER_SITE_OTHER): Payer: 59

## 2018-10-31 DIAGNOSIS — E785 Hyperlipidemia, unspecified: Secondary | ICD-10-CM | POA: Diagnosis not present

## 2018-10-31 DIAGNOSIS — K760 Fatty (change of) liver, not elsewhere classified: Secondary | ICD-10-CM

## 2018-10-31 LAB — COMPREHENSIVE METABOLIC PANEL
ALK PHOS: 58 U/L (ref 39–117)
ALT: 8 U/L (ref 0–35)
AST: 16 U/L (ref 0–37)
Albumin: 4.3 g/dL (ref 3.5–5.2)
BUN: 14 mg/dL (ref 6–23)
CALCIUM: 10 mg/dL (ref 8.4–10.5)
CO2: 26 mEq/L (ref 19–32)
Chloride: 105 mEq/L (ref 96–112)
Creatinine, Ser: 0.93 mg/dL (ref 0.40–1.20)
GFR: 64.72 mL/min (ref >60.00)
Glucose, Bld: 95 mg/dL (ref 70–99)
POTASSIUM: 4.6 meq/L (ref 3.5–5.1)
Sodium: 140 mEq/L (ref 135–145)
TOTAL PROTEIN: 7.3 g/dL (ref 6.0–8.3)
Total Bilirubin: 0.4 mg/dL (ref 0.2–1.2)

## 2018-10-31 LAB — LIPID PANEL
CHOLESTEROL: 267 mg/dL — AB (ref 0–200)
HDL: 71.9 mg/dL (ref >39.00)
LDL CALC: 177 mg/dL — AB (ref 0–99)
NonHDL: 195.55
Total CHOL/HDL Ratio: 4
Triglycerides: 92 mg/dL (ref 0.0–149.0)
VLDL: 18.4 mg/dL (ref 0.0–40.0)

## 2018-10-31 LAB — TSH: TSH: 3.76 u[IU]/mL (ref 0.35–4.50)

## 2018-11-02 NOTE — Progress Notes (Signed)
.  comp

## 2018-11-02 NOTE — Addendum Note (Signed)
Addended by: Crecencio Mc on: 11/02/2018 06:28 PM   Modules accepted: Orders

## 2018-11-11 ENCOUNTER — Encounter: Payer: Self-pay | Admitting: Family Medicine

## 2018-11-11 ENCOUNTER — Ambulatory Visit: Payer: 59 | Admitting: Family Medicine

## 2018-11-11 VITALS — BP 118/80 | HR 65 | Temp 97.6°F | Ht 63.0 in | Wt 137.2 lb

## 2018-11-11 DIAGNOSIS — J329 Chronic sinusitis, unspecified: Secondary | ICD-10-CM | POA: Diagnosis not present

## 2018-11-11 MED ORDER — AMOXICILLIN-POT CLAVULANATE 875-125 MG PO TABS: 1.0000 | ORAL_TABLET | Freq: Two times a day (BID) | ORAL | 0 refills | Status: DC

## 2018-11-11 NOTE — Telephone Encounter (Signed)
Form has been faxed.

## 2018-11-11 NOTE — Patient Instructions (Signed)

## 2018-11-11 NOTE — Progress Notes (Signed)
Subjective:    Patient ID: Katrina Woods, female    DOB: 11/18/1956, 62 y.o.   MRN: [id number]  HPI   Patient presents to clinic with sinus pressure around eyes, nose above teeth for past 2 weeks.  Patient states it did seem better for about a day, but symptoms returned.  She has been taking allergy relief medication every day without much help and symptom improvement.  She also has been working to increase her fluid intake and get good rest, but it seems every morning she wakes up feeling more congested and more fall in her head.  Her right ear has started to ache a lot also.  Patient denies fever or chills.  Denies nausea, vomiting or diarrhea.  States when she blows her nose it is thick white and yellow discharge.  Patient also reports feeling of drainage down the back of throat.  Patient Active Problem List   Diagnosis Date Noted  . OSA on CPAP 10/22/2018  . Influenza A 01/01/2018  . PTSD (post-traumatic stress disorder) 10/15/2017  . Grief reaction with prolonged bereavement 10/15/2017  . Sciatica of right side 10/15/2017  . Epidermal cyst 04/18/2017  . Strain of lumbar paraspinal muscle, initial encounter 12/20/2016  . Shingles 03/02/2016  . Statin intolerance 10/11/2013  . Fatty infiltration of liver 10/11/2013  . Encounter for preventive health examination 04/17/2013  . Tubular adenoma of colon 12/03/2012  . GERD (gastroesophageal reflux disease) 10/16/2012  . History of tobacco abuse 09/12/2012  . Depression with anxiety 09/12/2012  . Back pain, thoracic 09/12/2012  . Bursitis of left hip   . Hiatal hernia   . Hyperlipidemia LDL goal <100   . Aneurysm, cerebral, nonruptured    Social History   Tobacco Use  . Smoking status: Former Smoker    Types: Cigarettes  . Smokeless tobacco: Never Used  . Tobacco comment: quit smoking in Nov 2015  Substance Use Topics  . Alcohol use: Yes    Alcohol/week: 3.0 standard drinks    Types: 3 Glasses of wine per week   Comment: occasional wine   Review of Systems  Constitutional: Negative for chills, fatigue and fever.  HENT: +sinus pain, pressure. +ear pain, fullness.    Eyes: Negative.   Respiratory: Negative for cough, shortness of breath and wheezing.   Cardiovascular: Negative for chest pain, palpitations and leg swelling.  Gastrointestinal: Negative for abdominal pain, diarrhea, nausea and vomiting.  Genitourinary: Negative for dysuria, frequency and urgency.  Musculoskeletal: Negative for arthralgias and myalgias.  Skin: Negative for color change, pallor and rash.  Neurological: Negative for syncope, light-headedness and headaches.  Psychiatric/Behavioral: The patient is not nervous/anxious.       Objective:   Physical Exam  Constitutional: She is oriented to person, place, and time.  HENT:  Head: Normocephalic and atraumatic.  Nose: Mucosal edema and rhinorrhea present. Right sinus exhibits maxillary sinus tenderness and frontal sinus tenderness. Left sinus exhibits maxillary sinus tenderness and frontal sinus tenderness.  Positive fullness bilateral TMs.  Positive thick yellow nasal discharge.  Positive postnasal drip patterning down back of throat.  Eyes: Conjunctivae and EOM are normal. No scleral icterus.  Neck: Neck supple. No tracheal deviation present.  Cardiovascular: Normal rate and regular rhythm.  Pulmonary/Chest: Effort normal and breath sounds normal. No respiratory distress. She has no wheezes. She has no rales.  Neurological: She is alert and oriented to person, place, and time.  Skin: Skin is warm and dry. No pallor.  Psychiatric: She has a  normal mood and affect. Her behavior is normal.  Nursing note and vitals reviewed.     Vitals:   11/11/18 1102  BP: 118/80  Pulse: 65  Temp: 97.6 F (36.4 C)  SpO2: 97%    Assessment & Plan:   Sinusitis - patient will do Augmentin twice daily for 10 days.  Patient advised to take a probiotic or eat daily yogurt with this  medication.  Patient also advised to continue taking her allergy medication as this medicine will help dry up some congestion.  She also will begin doing a saline nasal washes to help rinse out sinus congestion.  Patient advised to get plenty of rest, increase fluid intake and do good handwashing.  Patient aware she can use Tylenol or Motrin as needed for any pain.  Keep regularly scheduled follow-up with PCP as planned.  Return to clinic sooner if any issues arise or if current symptoms persist or worsen.

## 2018-11-11 NOTE — Telephone Encounter (Signed)
Form has been filled out. Called pt to let pt know that she will need to sign the form before we can fax it. Pt has an appt today with Lauren and she stated that she will sign it then so we can fax it.

## 2018-11-29 DIAGNOSIS — G4733 Obstructive sleep apnea (adult) (pediatric): Secondary | ICD-10-CM | POA: Diagnosis not present

## 2018-12-01 DIAGNOSIS — G4733 Obstructive sleep apnea (adult) (pediatric): Secondary | ICD-10-CM | POA: Diagnosis not present

## 2018-12-15 ENCOUNTER — Encounter: Payer: Self-pay | Admitting: Internal Medicine

## 2018-12-30 DIAGNOSIS — G4733 Obstructive sleep apnea (adult) (pediatric): Secondary | ICD-10-CM | POA: Diagnosis not present

## 2019-01-02 ENCOUNTER — Ambulatory Visit (INDEPENDENT_AMBULATORY_CARE_PROVIDER_SITE_OTHER): Payer: 59

## 2019-01-02 ENCOUNTER — Ambulatory Visit: Payer: 59 | Admitting: Family Medicine

## 2019-01-02 ENCOUNTER — Encounter: Payer: Self-pay | Admitting: Family Medicine

## 2019-01-02 VITALS — BP 102/70 | HR 74 | Temp 98.2°F | Resp 18 | Ht 63.0 in | Wt 134.5 lb

## 2019-01-02 DIAGNOSIS — M79642 Pain in left hand: Secondary | ICD-10-CM

## 2019-01-02 DIAGNOSIS — M79641 Pain in right hand: Secondary | ICD-10-CM | POA: Diagnosis not present

## 2019-01-02 DIAGNOSIS — M25532 Pain in left wrist: Secondary | ICD-10-CM

## 2019-01-02 DIAGNOSIS — H1031 Unspecified acute conjunctivitis, right eye: Secondary | ICD-10-CM | POA: Diagnosis not present

## 2019-01-02 DIAGNOSIS — M1812 Unilateral primary osteoarthritis of first carpometacarpal joint, left hand: Secondary | ICD-10-CM | POA: Diagnosis not present

## 2019-01-02 DIAGNOSIS — M25531 Pain in right wrist: Secondary | ICD-10-CM

## 2019-01-02 DIAGNOSIS — M1811 Unilateral primary osteoarthritis of first carpometacarpal joint, right hand: Secondary | ICD-10-CM | POA: Diagnosis not present

## 2019-01-02 MED ORDER — POLYMYXIN B-TRIMETHOPRIM 10000-0.1 UNIT/ML-% OP SOLN: 1.0000 [drp] | OPHTHALMIC | 0 refills | Status: DC

## 2019-01-02 NOTE — Progress Notes (Signed)
Subjective:    Patient ID: Katrina Woods, female    DOB: 18-Jan-1956, 63 y.o.   MRN: [id number]  HPI   Patient presents to clinic due to bilateral wrist and hand pain for the past 2 months after a fall.  Patient states when she fell she reached backwards to catch herself, most of her weight landed on her hand/wrist with the fall.  Patient is wondering if the pain is related to arthritis or injury from the fall.  Patient states the right side does seem worse and radiates up into her forearm/elbow at times.  Patient already has meloxicam that she uses as needed for joint and arthritis type pain.  Denies fever or chills.  Denies numbness or tingling in extremities.   Also has some redness and crusting in right eye for 2 days.   Patient Active Problem List   Diagnosis Date Noted  . OSA on CPAP 10/22/2018  . Influenza A 01/01/2018  . PTSD (post-traumatic stress disorder) 10/15/2017  . Grief reaction with prolonged bereavement 10/15/2017  . Sciatica of right side 10/15/2017  . Epidermal cyst 04/18/2017  . Strain of lumbar paraspinal muscle, initial encounter 12/20/2016  . Shingles 03/02/2016  . Statin intolerance 10/11/2013  . Fatty infiltration of liver 10/11/2013  . Encounter for preventive health examination 04/17/2013  . Tubular adenoma of colon 12/03/2012  . GERD (gastroesophageal reflux disease) 10/16/2012  . History of tobacco abuse 09/12/2012  . Depression with anxiety 09/12/2012  . Back pain, thoracic 09/12/2012  . Bursitis of left hip   . Hiatal hernia   . Hyperlipidemia LDL goal <100   . Aneurysm, cerebral, nonruptured    Social History   Tobacco Use  . Smoking status: Former Smoker    Types: Cigarettes  . Smokeless tobacco: Never Used  . Tobacco comment: quit smoking in Nov 2015  Substance Use Topics  . Alcohol use: Yes    Alcohol/week: 3.0 standard drinks    Types: 3 Glasses of wine per week    Comment: occasional wine   Review of Systems     Constitutional: Negative for chills, fatigue and fever.  HENT: Negative for congestion, ear pain, sinus pain and sore throat.   Eyes: +redness and crusting in right eye   Respiratory: Negative for cough, shortness of breath and wheezing.   Cardiovascular: Negative for chest pain, palpitations and leg swelling.  Gastrointestinal: Negative for abdominal pain, diarrhea, nausea and vomiting.  Genitourinary: Negative for dysuria, frequency and urgency.  Musculoskeletal: +bilateral wrist and hand pain.  Skin: Negative for color change, pallor and rash.  Neurological: Negative for syncope, light-headedness and headaches.  Psychiatric/Behavioral: The patient is not nervous/anxious.       Objective:   Physical Exam Vitals signs and nursing note reviewed.  Constitutional:      General: She is not in acute distress.    Appearance: She is not toxic-appearing.  HENT:     Head: Normocephalic and atraumatic.  Eyes:     General:        Right eye: Discharge present. No foreign body or hordeolum.        Left eye: No foreign body, discharge or hordeolum.     Extraocular Movements:     Right eye: Normal extraocular motion.     Left eye: Normal extraocular motion.     Conjunctiva/sclera:     Right eye: Right conjunctiva is injected. Exudate present.     Left eye: Left conjunctiva is not injected. No exudate. Cardiovascular:  Rate and Rhythm: Normal rate and regular rhythm.     Comments: +radial and ulnar pulses Pulmonary:     Effort: Pulmonary effort is normal. No respiratory distress.     Breath sounds: Normal breath sounds.  Musculoskeletal:        General: No swelling or deformity.     Right wrist: She exhibits bony tenderness (tenderness bilateral sides of right wrist. pain in fingers when fully extended and when having to make tight fist. ). She exhibits normal range of motion, no crepitus and no deformity.     Left wrist: She exhibits bony tenderness (tenderness bilateral sides of left  wrist. pain in fingers when fully extended and when having to make tight fist.). She exhibits no swelling, no crepitus and no deformity.  Skin:    General: Skin is warm and dry.     Coloration: Skin is not pale.  Neurological:     Mental Status: She is alert and oriented to person, place, and time.  Psychiatric:        Mood and Affect: Mood normal.        Behavior: Behavior normal.    Vitals:   01/02/19 1535  BP: 102/70  Pulse: 74  Resp: 18  Temp: 98.2 F (36.8 C)  SpO2: 98%      Assessment & Plan:   Pain in both hands/pain in both wrists - patient has pain and tenderness in both wrists and hands, could be related to fall as well as osteoarthritis.  We will get x-rays to further evaluate pain.  Patient will continue to use meloxicam that she already has prescribed as needed for joint pains.  Also advised she can do Tylenol as needed on top of the meloxicam.  Patient already has used a topical CBD cream with some effect in helping reduce pain as well.  Acute bacterial conjunctivitis of right eye - due to redness and crusting of right eye, will treat with Polytrim drops to cover bacterial conjunctivitis.  Advised to dispose of any recently used eye make-up, change pillowcase, do good handwashing and sanitize commonly use services.  Patient will keep regularly scheduled follow-up with PCP as planned.  Advised to return to clinic sooner if any issues arise.

## 2019-01-30 DIAGNOSIS — G4733 Obstructive sleep apnea (adult) (pediatric): Secondary | ICD-10-CM | POA: Diagnosis not present

## 2019-03-31 DIAGNOSIS — G4733 Obstructive sleep apnea (adult) (pediatric): Secondary | ICD-10-CM | POA: Diagnosis not present

## 2019-05-21 ENCOUNTER — Other Ambulatory Visit: Payer: Self-pay | Admitting: Internal Medicine

## 2019-07-13 ENCOUNTER — Telehealth (HOSPITAL_COMMUNITY): Payer: Self-pay

## 2019-07-13 NOTE — Telephone Encounter (Signed)
Called to schedule mra, no answer, left vm. AW  ?

## 2019-08-14 ENCOUNTER — Other Ambulatory Visit: Payer: Self-pay | Admitting: Internal Medicine

## 2019-08-14 NOTE — Telephone Encounter (Signed)
Refilled: 10/22/2018 Last OV: 01/02/2019 Next OV: not scheduled

## 2019-09-10 ENCOUNTER — Telehealth (HOSPITAL_COMMUNITY): Payer: Self-pay

## 2019-09-10 NOTE — Telephone Encounter (Signed)
Called to schedule mri, no answer, left vm. AW 

## 2019-09-18 DIAGNOSIS — U071 COVID-19: Secondary | ICD-10-CM | POA: Insufficient documentation

## 2019-09-20 DIAGNOSIS — U071 COVID-19: Secondary | ICD-10-CM | POA: Insufficient documentation

## 2019-09-21 ENCOUNTER — Encounter: Payer: Self-pay | Admitting: Internal Medicine

## 2019-09-21 MED ORDER — CIPROFLOXACIN HCL 250 MG PO TABS: 250.0000 mg | ORAL_TABLET | Freq: Two times a day (BID) | ORAL | 0 refills | Status: DC

## 2019-09-21 NOTE — Telephone Encounter (Signed)
Pt stated that she thinks she has a UTI. She stated she is seeing traces of blood in her urine and when she wipes and is having burning with urination. Pt has a virtual visit with Dr. Aundra Dubin tomorrow but pt doesn't feel she can wait until then. Pt can not come into the office to leave a urine sample because she tested positive for covid yesterday with the [facility]. Pt stated that in the past she has been given Cipro 250mg . Pt is wanting to know if this can be called in for her.

## 2019-09-21 NOTE — Telephone Encounter (Signed)
Yes I have sent c[facility] to [facility]

## 2019-09-22 ENCOUNTER — Encounter: Payer: Self-pay | Admitting: Internal Medicine

## 2019-09-22 ENCOUNTER — Other Ambulatory Visit: Payer: Self-pay

## 2019-09-22 ENCOUNTER — Encounter (INDEPENDENT_AMBULATORY_CARE_PROVIDER_SITE_OTHER): Payer: Self-pay

## 2019-09-22 ENCOUNTER — Ambulatory Visit (INDEPENDENT_AMBULATORY_CARE_PROVIDER_SITE_OTHER): Payer: 59 | Admitting: Internal Medicine

## 2019-09-22 DIAGNOSIS — U071 COVID-19: Secondary | ICD-10-CM | POA: Diagnosis not present

## 2019-09-22 DIAGNOSIS — N3001 Acute cystitis with hematuria: Secondary | ICD-10-CM

## 2019-09-22 NOTE — Telephone Encounter (Signed)
Pt was seen by Dr. Aundra Dubin this morning for Covidd-19 symptoms and treated.

## 2019-09-22 NOTE — Progress Notes (Signed)
Virtual Visit via Video Note  I connected with Katrina Woods  on 09/22/19 at  8:45 AM EDT by a video enabled telemedicine application and verified that I am speaking with the correct person using two identifiers.  Location patient: home Location provider:work or home office Persons participating in the virtual visit: patient, provider  I discussed the limitations of evaluation and management by telemedicine and the availability of in person appointments. The patient expressed understanding and agreed to proceed.   HPI: 1. covid 36 + called by health dept 09/20/19 she did go out to eat with a friend but was having sxs when she went out to eat then she was tested. She has been using hand sanitizer, wearing a mask and social distancing and lives alone  Sx's include h/a, runny nose, loss of taste and smell, diarrhea, nausea last night, fever max 99.7 F tried water ginger ale and soups. She also had dry cough with some phelgm and sore throat Denies sob, chest pain   2. Urinary tract sx's with low back pain and and urinary discomfort and blood in urine on 09/18/19 given Cipro by PCP and feeling better    ROS: See pertinent positives and negatives per HPI.  Past Medical History:  Diagnosis Date  . Allergy   . Aneurysm ([place])    cerebral  . Back pain   . Bursitis of left hip 8-13   takes Meloxicam daily   . Depression    takes Lexapro daily  . GERD (gastroesophageal reflux disease)    takes Omeprazole daily  . Headache   . Hiatal hernia 209  . History of bronchitis 2015  . History of colon polyps    benign  . History of kidney stones   . History of shingles   . Hyperlipidemia    takes Atorvastatin daily  . Insomnia    takes Xanax nightly  . Weakness    numbness and tingling-thinks its carpal tunnel    Past Surgical History:  Procedure Laterality Date  . ABDOMINAL HYSTERECTOMY  2004   total  . CEREBRAL ANEURYSM REPAIR  2006   Deveshwar, coil   . CESAREAN SECTION  11-25-82   . COLONOSCOPY    . ESOPHAGOGASTRODUODENOSCOPY    . IR GENERIC HISTORICAL  10/23/2016   IR RADIOLOGIST EVAL & MGMT 10/23/2016 [facility]  . KNEE CARTILAGE SURGERY Left 1987  . POLYPECTOMY    . RADIOLOGY WITH ANESTHESIA N/A 05/11/2015   Procedure: ANEURYSM EMBOLIZATION;  Surgeon: Luanne Bras, MD;  Location: [facility];  Service: Radiology;  Laterality: N/A;  . WISDOM TOOTH EXTRACTION  1986   all four extracted    Family History  Problem Relation Age of Onset  . Diabetes Mother        type 2  . Hypertension Mother   . Heart disease Mother        CHF  . Cancer Mother        lung- previous smoker- had quit 12 yrs before  . Appendicitis Father        acute  . Cancer Father        prostate?  . Diabetes Brother 6       type 2  . Hypertension Brother   . Cancer Maternal Grandmother        breast, uterine  . Hypertension Maternal Grandmother   . Heart disease Maternal Grandmother        CHF  . Diabetes Maternal Grandmother   . Stroke Maternal Grandfather   .  Heart block Paternal Grandfather        massive  . Colon cancer Neg Hx   . Stomach cancer Neg Hx   . Colon polyps Neg Hx   . Esophageal cancer Neg Hx   . Rectal cancer Neg Hx     SOCIAL HX: lives alone    Current Outpatient Medications:  .  ALPRAZolam (XANAX) 1 MG tablet, TAKE 1 TABLET BY MOUTH AT BEDTIME AS NEEDED FOR ANXIETY OR SLEEP, Disp: 30 tablet, Rfl: 0 .  ciprofloxacin (CIPRO) 250 MG tablet, Take 1 tablet (250 mg total) by mouth 2 (two) times daily., Disp: 6 tablet, Rfl: 0 .  escitalopram (LEXAPRO) 10 MG tablet, TAKE 1 TABLET BY MOUTH EVERY DAY, Disp: 90 tablet, Rfl: 1 .  ezetimibe (ZETIA) 10 MG tablet, Take 1 tablet (10 mg total) by mouth daily., Disp: 30 tablet, Rfl: 3 .  ibuprofen (ADVIL,MOTRIN) 200 MG tablet, Take 800 mg by mouth every 6 (six) hours as needed for headache., Disp: , Rfl:  .  meloxicam (MOBIC) 15 MG tablet, TAKE 1 TABLET BY MOUTH EVERY DAY, Disp: 90 tablet, Rfl: 1 .   trimethoprim-polymyxin b (POLYTRIM) ophthalmic solution, Place 1 drop into the right eye every 4 (four) hours. Do for 7 days, Disp: 10 mL, Rfl: 0  Current Facility-Administered Medications:  .  0.9 %  sodium chloride infusion, 500 mL, Intravenous, Once, Ladene Artist, MD  EXAM:  VITALS per patient if applicable:  GENERAL: alert, oriented, appears well and in no acute distress  HEENT: atraumatic, conjunttiva clear, no obvious abnormalities on inspection of external nose and ears  NECK: normal movements of the head and neck  LUNGS: on inspection no signs of respiratory distress, breathing rate appears normal, no obvious gross SOB, gasping or wheezing  CV: no obvious cyanosis  MS: moves all visible extremities without noticeable abnormality  PSYCH/NEURO: pleasant and cooperative, no obvious depression or anxiety, speech and thought processing grossly intact  ASSESSMENT AND PLAN:  Discussed the following assessment and plan:  COVID-19 virus detected rec supportive care brat diet, hydration, vitamin C elderberry, prn Tylenol, Mucinex DM, tea with honey and lemon If worse go to ED  Letter for work though works from home and quarantine x 14 days   Acute cystitis with hematuria cipro is helping 250 mg bid per PCP   -we discussed possible serious and likely etiologies, options for evaluation and workup, limitations of telemedicine visit vs in person visit, treatment, treatment risks and precautions. Pt prefers to treat via telemedicine empirically rather then risking or undertaking an in person visit at this moment. Patient agrees to seek prompt in person care if worsening, new symptoms arise, or if is not improving with treatment.   I discussed the assessment and treatment plan with the patient. The patient was provided an opportunity to ask questions and all were answered. The patient agreed with the plan and demonstrated an understanding of the instructions.   The patient was  advised to call back or seek an in-person evaluation if the symptoms worsen or if the condition fails to improve as anticipated.  Time spent 15 minutes  Delorise Jackson, MD

## 2019-09-24 ENCOUNTER — Encounter (INDEPENDENT_AMBULATORY_CARE_PROVIDER_SITE_OTHER): Payer: Self-pay

## 2019-09-24 ENCOUNTER — Telehealth: Payer: Self-pay

## 2019-09-24 NOTE — Telephone Encounter (Signed)
Attempted to call patient back 2 times to advise on questionnaire

## 2019-09-24 NOTE — Telephone Encounter (Signed)
Left a vm for patient to callback regarding questionnaire. 

## 2019-10-01 ENCOUNTER — Telehealth: Payer: Self-pay | Admitting: Internal Medicine

## 2019-10-01 ENCOUNTER — Other Ambulatory Visit: Payer: Self-pay | Admitting: Internal Medicine

## 2019-10-01 ENCOUNTER — Encounter: Payer: Self-pay | Admitting: Internal Medicine

## 2019-10-01 DIAGNOSIS — R05 Cough: Secondary | ICD-10-CM

## 2019-10-01 DIAGNOSIS — R059 Cough, unspecified: Secondary | ICD-10-CM

## 2019-10-01 DIAGNOSIS — U071 COVID-19: Secondary | ICD-10-CM

## 2019-10-01 MED ORDER — ALBUTEROL SULFATE HFA 108 (90 BASE) MCG/ACT IN AERS: 1.0000 | INHALATION_SPRAY | Freq: Four times a day (QID) | RESPIRATORY_TRACT | 1 refills | Status: DC | PRN

## 2019-10-22 ENCOUNTER — Other Ambulatory Visit: Payer: Self-pay

## 2019-10-22 ENCOUNTER — Telehealth: Payer: Self-pay | Admitting: Internal Medicine

## 2019-10-22 NOTE — Telephone Encounter (Signed)
Yes,  Katrina Woods can be given a face to face appointment

## 2019-10-22 NOTE — Telephone Encounter (Signed)
Pt called in to go over her travel screen before coming in. Pt was tested positive for covid 19 on 10/9. Pt is past 21-30days. Pt does still have no smell and no taste. Pt states she was told it could take awhile for those senses to come back. Is it ok for pt to come in? Please advise and Thank you!

## 2019-10-26 ENCOUNTER — Encounter: Payer: Self-pay | Admitting: Internal Medicine

## 2019-10-26 ENCOUNTER — Ambulatory Visit (INDEPENDENT_AMBULATORY_CARE_PROVIDER_SITE_OTHER): Payer: 59 | Admitting: Internal Medicine

## 2019-10-26 ENCOUNTER — Other Ambulatory Visit: Payer: Self-pay

## 2019-10-26 DIAGNOSIS — E785 Hyperlipidemia, unspecified: Secondary | ICD-10-CM

## 2019-10-26 DIAGNOSIS — B349 Viral infection, unspecified: Secondary | ICD-10-CM | POA: Diagnosis not present

## 2019-10-26 DIAGNOSIS — R519 Headache, unspecified: Secondary | ICD-10-CM | POA: Diagnosis not present

## 2019-10-26 DIAGNOSIS — R030 Elevated blood-pressure reading, without diagnosis of hypertension: Secondary | ICD-10-CM | POA: Diagnosis not present

## 2019-10-26 DIAGNOSIS — I951 Orthostatic hypotension: Secondary | ICD-10-CM

## 2019-10-26 DIAGNOSIS — U071 COVID-19: Secondary | ICD-10-CM

## 2019-10-26 DIAGNOSIS — R634 Abnormal weight loss: Secondary | ICD-10-CM | POA: Insufficient documentation

## 2019-10-26 DIAGNOSIS — Z1231 Encounter for screening mammogram for malignant neoplasm of breast: Secondary | ICD-10-CM

## 2019-10-26 DIAGNOSIS — I671 Cerebral aneurysm, nonruptured: Secondary | ICD-10-CM

## 2019-10-26 DIAGNOSIS — Z23 Encounter for immunization: Secondary | ICD-10-CM | POA: Diagnosis not present

## 2019-10-26 DIAGNOSIS — R5383 Other fatigue: Secondary | ICD-10-CM

## 2019-10-26 DIAGNOSIS — Z0000 Encounter for general adult medical examination without abnormal findings: Secondary | ICD-10-CM | POA: Diagnosis not present

## 2019-10-26 DIAGNOSIS — M130 Polyarthritis, unspecified: Secondary | ICD-10-CM

## 2019-10-26 NOTE — Assessment & Plan Note (Signed)
Affecting joints of hands .  Given concurrent headache will rule vasculitis

## 2019-10-26 NOTE — Assessment & Plan Note (Addendum)
Given her history of tobacco abuse  , her 10 year risk of coronary artery disease is elevated and supported by the presence of atherosclerosis is also noted on plain films.  She did not tolerate atorvastatin due to joint pain, and states that she is having the same reaction with Zetia .  She will return for baseline fasting labs .   Lab Results  Component Value Date   CHOL 267 (H) 10/31/2018   HDL 71.90 10/31/2018   LDLCALC 177 (H) 10/31/2018   LDLDIRECT 166.0 10/14/2017   TRIG 92.0 10/31/2018   CHOLHDL 4 10/31/2018

## 2019-10-26 NOTE — Assessment & Plan Note (Signed)
Given her history of tobacco abuse  , her 10 year risk of coronary artery disease is elevated and supported by the presence of atherosclerosis is also noted on plain films.  She did not tolerate atorvastatin due to joint pain, but is willing to try Zetia.  She will return for baseline fasting labs .   Lab Results  Component Value Date   CHOL 267 (H) 10/31/2018   HDL 71.90 10/31/2018   LDLCALC 177 (H) 10/31/2018   LDLDIRECT 166.0 10/14/2017   TRIG 92.0 10/31/2018   CHOLHDL 4 10/31/2018

## 2019-10-26 NOTE — Assessment & Plan Note (Signed)
With fatigue , likely secondary to COVID INFECTION.  Will rule out thyroiditis

## 2019-10-26 NOTE — Progress Notes (Signed)
Patient ID: Katrina Woods, female    DOB: [birth year]  Age: 63 y.o. MRN: [id number]  The patient is here for annual preventive examination and management of other chronic and acute problems.  Mammogram was due in April 2020  PAP normal in 2018 Colonoscopy 2014  Due for HIV screen  Received influenza vaccine    The risk factors are reflected in the social history.  The roster of all physicians providing medical care to patient - is listed in the Snapshot section of the chart.  Activities of daily living:  The patient is 100% independent in all ADLs: dressing, toileting, feeding as well as independent mobility  Home safety : The patient has smoke detectors in the home. They wear seatbelts.  There are no firearms at home. There is no violence in the home.   There is no risks for hepatitis, STDs or HIV. There is no   history of blood transfusion. They have no travel history to infectious disease endemic areas of the world.  The patient has seen their dentist in the last six month. They have seen their eye doctor in the last year. They admit to slight hearing difficulty with regard to whispered voices and some television programs.  They have deferred audiologic testing in the last year.  They do not  have excessive sun exposure. Discussed the need for sun protection: hats, long sleeves and use of sunscreen if there is significant sun exposure.   Diet: the importance of a healthy diet is discussed. They do have a healthy diet.  The benefits of regular aerobic exercise were discussed. She practices yoga and aerobic exercise several times per week.    Depression screen: there are no signs or vegative symptoms of depression- irritability, change in appetite, anhedonia, sadness/tearfullness.  Cognitive assessment: the patient manages all their financial and personal affairs and is actively engaged. They could relate day,date,year and events; recalled 2/3 objects at 3 minutes; performed  clock-face test normally.  The following portions of the patient's history were reviewed and updated as appropriate: allergies, current medications, past family history, past medical history,  past surgical history, past social history  and problem list.  Visual acuity was not assessed per patient preference since she has regular follow up with her ophthalmologist. Hearing and body mass index were assessed and reviewed.   During the course of the visit the patient was educated and counseled about appropriate screening and preventive services including : fall prevention , diabetes screening, nutrition counseling, colorectal cancer screening, and recommended immunizations.    CC: The primary encounter diagnosis was Encounter for screening mammogram for malignant neoplasm of breast. Diagnoses of Need for immunization against influenza, COVID-19 virus infection, Headache due to viral infection, Hyperlipidemia LDL goal <100, Elevated blood-pressure reading, without diagnosis of hypertension, Weight loss, Fatigue, unspecified type, Aneurysm, cerebral, nonruptured, Encounter for preventive health examination, Polyarthritis, Orthostasis, and COVID-19 virus detected were also pertinent to this visit.  1) COVID 19 INFECTION diagnosed OCT 11 .  Still fighting fatigue and poor concentration. Chain of contact unclear.  Contacts were tested and negative; she  lives alone,  Was wearing mask everywhere. .  Daughter has been  too nervous to let her see her own granddaughter.    2) In August Has had sudden onset of weakness causing LOC that lasted < 2 sec and fall. Occurred during a time of great emotional trauma; however she has  several recurrent episodes of weakness with standing up, without recurrent syncope  Orthostatics done  today 140/84 lying 140/70 standing pulse unchanged at 65   3) elevated bp  Attributed to Anxiety,  Using alprazolam 1/2 tablet for insomnia, occasionally during the day for panic attacks    4) weight loss 8 lbs since Dec 2019 appetite small,  No nausea  5) joint pain hands .thumbs aggravated by Katrina Woods . No longer taking medication using cbd oil   6) recurrent shooting pains on the left side of head .  Started after developing  COVID INFECTION .  7) cerebral aneurysm.  Due for follow up with Katrina Woods History Katrina Woods has a past medical history of Allergy, Aneurysm ([place]), Back pain, Bursitis of left hip (8-13), Depression, GERD (gastroesophageal reflux disease), Headache, Hiatal hernia (209), History of bronchitis (2015), History of colon polyps, History of kidney stones, History of shingles, Hyperlipidemia, Insomnia, and Weakness.   She has a past surgical history that includes Abdominal hysterectomy (2004); Knee cartilage surgery (Left, 1987); Cesarean section (11-25-82); Wisdom tooth extraction (1986); Cerebral aneurysm repair (2006); Colonoscopy; Esophagogastroduodenoscopy; Radiology with anesthesia (N/A, 05/11/2015); ir generic historical (10/23/2016); and Polypectomy.   Her family history includes Appendicitis in her father; Cancer in her father, maternal grandmother, and mother; Diabetes in her maternal grandmother and mother; Diabetes (age of onset: 78) in her brother; Heart block in her paternal grandfather; Heart disease in her maternal grandmother and mother; Hypertension in her brother, maternal grandmother, and mother; Stroke in her maternal grandfather.She reports that she has quit smoking. Her smoking use included cigarettes. She has never used smokeless tobacco. She reports current alcohol use of about 3.0 standard drinks of alcohol per week. She reports that she does not use drugs.  Outpatient Medications Prior to Visit  Medication Sig Dispense Refill  . ALPRAZolam (XANAX) 1 MG tablet TAKE 1 TABLET BY MOUTH AT BEDTIME AS NEEDED FOR ANXIETY OR SLEEP 30 tablet 0  . escitalopram (LEXAPRO) 10 MG tablet TAKE 1 TABLET BY MOUTH EVERY DAY 90 tablet 1  . ibuprofen (ADVIL,MOTRIN)  200 MG tablet Take 800 mg by mouth every 6 (six) hours as needed for headache.    . meloxicam (MOBIC) 15 MG tablet TAKE 1 TABLET BY MOUTH EVERY DAY 90 tablet 1  . ezetimibe (Katrina Woods) 10 MG tablet Take 1 tablet (10 mg total) by mouth daily. (Patient not taking: Reported on 10/26/2019) 30 tablet 3  . albuterol (VENTOLIN HFA) 108 (90 Base) MCG/ACT inhaler Inhale 1-2 puffs into the lungs every 6 (six) hours as needed for wheezing or shortness of breath. (Patient not taking: Reported on 10/26/2019) 18 g 1  . ciprofloxacin (CIPRO) 250 MG tablet Take 1 tablet (250 mg total) by mouth 2 (two) times daily. (Patient not taking: Reported on 10/26/2019) 6 tablet 0  . trimethoprim-polymyxin b (POLYTRIM) ophthalmic solution Place 1 drop into the right eye every 4 (four) hours. Do for 7 days (Patient not taking: Reported on 10/26/2019) 10 mL 0   Facility-Administered Medications Prior to Visit  Medication Dose Route Frequency Provider Last Rate Last Dose  . 0.9 %  sodium chloride infusion  500 mL Intravenous Once Ladene Artist, MD        Review of Systems   Patient denies headache, fevers, malaise, unintentional weight loss, skin rash, eye pain, sinus congestion and sinus pain, sore throat, dysphagia,  hemoptysis , cough, dyspnea, wheezing, chest pain, palpitations, orthopnea, edema, abdominal pain, nausea, melena, diarrhea, constipation, flank pain, dysuria, hematuria, urinary  Frequency, nocturia, numbness, tingling, seizures,  Focal weakness, Loss of consciousness,  Tremor, insomnia, depression,  anxiety, and suicidal ideation.      Objective:  BP 138/82 (BP Location: Left Arm, Patient Position: Sitting, Cuff Size: Normal)   Pulse 64   Temp 98.2 F (36.8 C) (Temporal)   Resp 15   Ht 5\' 3"  (1.6 m)   Wt 129 lb (58.5 kg)   SpO2 98%   BMI 22.85 kg/m   Physical Exam   General appearance: alert, cooperative and appears stated age Head: Normocephalic, without obvious abnormality, atraumatic Eyes:  conjunctivae/corneas clear. PERRL, EOM's intact. Fundi benign. Ears: normal TM's and external ear canals both ears Nose: Nares normal. Septum midline. Mucosa normal. No drainage or sinus tenderness. Throat: lips, mucosa, and tongue normal; teeth and gums normal Neck: no adenopathy, no carotid bruit, no JVD, supple, symmetrical, trachea midline and thyroid not enlarged, symmetric, no tenderness/mass/nodules Lungs: clear to auscultation bilaterally Breasts: normal appearance, no masses or tenderness Heart: regular rate and rhythm, S1, S2 normal, no murmur, click, rub or gallop Abdomen: soft, non-tender; bowel sounds normal; no masses,  no organomegaly Extremities: extremities normal, atraumatic, no cyanosis or edema Pulses: 2+ and symmetric Skin: Skin color, texture, turgor normal. No rashes or lesions Neurologic: Alert and oriented X 3, normal strength and tone. Normal symmetric reflexes. Normal coordination and gait.      Assessment & Plan:   Problem List Items Addressed This Visit      Unprioritized   Hyperlipidemia LDL goal <100    Given her history of tobacco abuse  , her 10 year risk of coronary artery disease is elevated and supported by the presence of atherosclerosis is also noted on plain films.  She did not tolerate atorvastatin due to joint pain, and states that she is having the same reaction with Katrina Woods .  She will return for baseline fasting labs .   Lab Results  Component Value Date   CHOL 267 (H) 10/31/2018   HDL 71.90 10/31/2018   LDLCALC 177 (H) 10/31/2018   LDLDIRECT 166.0 10/14/2017   TRIG 92.0 10/31/2018   CHOLHDL 4 10/31/2018         Relevant Orders   Lipid panel   Aneurysm, cerebral, nonruptured    Given her history of tobacco abuse  , her 10 year risk of coronary artery disease is elevated and supported by the presence of atherosclerosis is also noted on plain films.  She did not tolerate atorvastatin due to joint pain, but is willing to try Katrina Woods.  She  will return for baseline fasting labs .   Lab Results  Component Value Date   CHOL 267 (H) 10/31/2018   HDL 71.90 10/31/2018   LDLCALC 177 (H) 10/31/2018   LDLDIRECT 166.0 10/14/2017   TRIG 92.0 10/31/2018   CHOLHDL 4 10/31/2018         Encounter for preventive health examination    age appropriate education and counseling updated, referrals for preventative services and immunizations addressed, dietary and smoking counseling addressed, most recent labs reviewed.  I have personally reviewed and have noted:  1) the patient's medical and social history 2) The pt's use of alcohol, tobacco, and illicit drugs 3) The patient's current medications and supplements 4) Functional ability including ADL's, fall risk, home safety risk, hearing and visual impairment 5) Diet and physical activities 6) Evidence for depression or mood disorder 7) The patient's height, weight, and BMI have been recorded in the chart  I have made referrals, and provided counseling and education based on review of the above  COVID-19 virus detected    She has recovered from the infectious symptoms but continues to have fatigue and poor concentration.  Dispelled the myth of "COVID brain." Reassured her that she is no longer contagious.  Checking COVID AB      Polyarthritis    Affecting joints of hands .  Given concurrent headache will rule vasculitis       Orthostasis    Symptoms are intermittent and were not present today during vital sign assessment . Advised to increase water intake and reduce caffeine.        Other Visit Diagnoses    Encounter for screening mammogram for malignant neoplasm of breast    -  Primary   Relevant Orders   3d mammogram [facility]   Need for immunization against influenza       Relevant Orders   Flu Vaccine QUAD 36+ mos IM (Completed)   COVID-19 virus infection       Relevant Orders   SARS-COV-2 IgG   Headache due to viral infection       Relevant Orders   Sedimentation  rate   C-reactive protein   CBC with Differential/Platelet   Elevated blood-pressure reading, without diagnosis of hypertension       Relevant Orders   Comprehensive metabolic panel   Microalbumin / creatinine urine ratio   Weight loss       Relevant Orders   TSH   Fatigue, unspecified type       Relevant Orders   CBC with Differential/Platelet      I have discontinued Verne Carrow. Mirsky's trimethoprim-polymyxin b, ciprofloxacin, and albuterol. I am also having her maintain her ibuprofen, ezetimibe, meloxicam, escitalopram, and ALPRAZolam. We will continue to administer sodium chloride.  No orders of the defined types were placed in this encounter.   Medications Discontinued During This Encounter  Medication Reason  . albuterol (VENTOLIN HFA) 108 (90 Base) MCG/ACT inhaler Patient has not taken in last 30 days  . ciprofloxacin (CIPRO) 250 MG tablet Completed Course  . trimethoprim-polymyxin b (POLYTRIM) ophthalmic solution Completed Course    Follow-up: No follow-ups on file.   Crecencio Mc, MD

## 2019-10-26 NOTE — Assessment & Plan Note (Signed)

## 2019-10-26 NOTE — Assessment & Plan Note (Signed)
Symptoms are intermittent and were not present today during vital sign assessment . Advised to increase water intake and reduce caffeine.

## 2019-10-26 NOTE — Assessment & Plan Note (Signed)
She has recovered from the infectious symptoms but continues to have fatigue and poor concentration.  Dispelled the myth of "COVID brain." Reassured her that she is no longer contagious.  Checking COVID AB

## 2019-10-26 NOTE — Patient Instructions (Addendum)
I want you to purchase an [employer] blood pressure machine  Goal is 120/70  Given your cerebral aneurysm   Your annual mammogram has been ordered.  You are encouraged (required) to call to make your appointment at [facility]   Health Maintenance for Postmenopausal Women Menopause is a normal process in which your ability to get pregnant comes to an end. This process happens slowly over many months or years, usually between the ages of 21 and 80. Menopause is complete when you have missed your menstrual periods for 12 months. It is important to talk with your health care provider about some of the most common conditions that affect women after menopause (postmenopausal women). These include heart disease, cancer, and bone loss (osteoporosis). Adopting a healthy lifestyle and getting preventive care can help to promote your health and wellness. The actions you take can also lower your chances of developing some of these common conditions. What should I know about menopause? During menopause, you may get a number of symptoms, such as:  Hot flashes. These can be moderate or severe.  Night sweats.  Decrease in sex drive.  Mood swings.  Headaches.  Tiredness.  Irritability.  Memory problems.  Insomnia. Choosing to treat or not to treat these symptoms is a decision that you make with your health care provider. Do I need hormone replacement therapy?  Hormone replacement therapy is effective in treating symptoms that are caused by menopause, such as hot flashes and night sweats.  Hormone replacement carries certain risks, especially as you become older. If you are thinking about using estrogen or estrogen with progestin, discuss the benefits and risks with your health care provider. What is my risk for heart disease and stroke? The risk of heart disease, heart attack, and stroke increases as you age. One of the causes may be a change in the body's hormones during menopause. This  can affect how your body uses dietary fats, triglycerides, and cholesterol. Heart attack and stroke are medical emergencies. There are many things that you can do to help prevent heart disease and stroke. Watch your blood pressure  High blood pressure causes heart disease and increases the risk of stroke. This is more likely to develop in people who have high blood pressure readings, are of African descent, or are overweight.  Have your blood pressure checked: ? Every 3-5 years if you are 61-84 years of age. ? Every year if you are 24 years old or older. Eat a healthy diet   Eat a diet that includes plenty of vegetables, fruits, low-fat dairy products, and lean protein.  Do not eat a lot of foods that are high in solid fats, added sugars, or sodium. Get regular exercise Get regular exercise. This is one of the most important things you can do for your health. Most adults should:  Try to exercise for at least 150 minutes each week. The exercise should increase your heart rate and make you sweat (moderate-intensity exercise).  Try to do strengthening exercises at least twice each week. Do these in addition to the moderate-intensity exercise.  Spend less time sitting. Even light physical activity can be beneficial. Other tips  Work with your health care provider to achieve or maintain a healthy weight.  Do not use any products that contain nicotine or tobacco, such as cigarettes, e-cigarettes, and chewing tobacco. If you need help quitting, ask your health care provider.  Know your numbers. Ask your health care provider to check your cholesterol and  your blood sugar (glucose). Continue to have your blood tested as directed by your health care provider. Do I need screening for cancer? Depending on your health history and family history, you may need to have cancer screening at different stages of your life. This may include screening for:  Breast cancer.  Cervical cancer.  Lung  cancer.  Colorectal cancer. What is my risk for osteoporosis? After menopause, you may be at increased risk for osteoporosis. Osteoporosis is a condition in which bone destruction happens more quickly than new bone creation. To help prevent osteoporosis or the bone fractures that can happen because of osteoporosis, you may take the following actions:  If you are 47-27 years old, get at least 1,000 mg of calcium and at least 600 mg of vitamin D per day.  If you are older than age 77 but younger than age 18, get at least 1,200 mg of calcium and at least 600 mg of vitamin D per day.  If you are older than age 32, get at least 1,200 mg of calcium and at least 800 mg of vitamin D per day. Smoking and drinking excessive alcohol increase the risk of osteoporosis. Eat foods that are rich in calcium and vitamin D, and do weight-bearing exercises several times each week as directed by your health care provider. How does menopause affect my mental health? Depression may occur at any age, but it is more common as you become older. Common symptoms of depression include:  Low or sad mood.  Changes in sleep patterns.  Changes in appetite or eating patterns.  Feeling an overall lack of motivation or enjoyment of activities that you previously enjoyed.  Frequent crying spells. Talk with your health care provider if you think that you are experiencing depression. General instructions See your health care provider for regular wellness exams and vaccines. This may include:  Scheduling regular health, dental, and eye exams.  Getting and maintaining your vaccines. These include: ? Influenza vaccine. Get this vaccine each year before the flu season begins. ? Pneumonia vaccine. ? Shingles vaccine. ? Tetanus, diphtheria, and pertussis (Tdap) booster vaccine. Your health care provider may also recommend other immunizations. Tell your health care provider if you have ever been abused or do not feel safe at  home. Summary  Menopause is a normal process in which your ability to get pregnant comes to an end.  This condition causes hot flashes, night sweats, decreased interest in sex, mood swings, headaches, or lack of sleep.  Treatment for this condition may include hormone replacement therapy.  Take actions to keep yourself healthy, including exercising regularly, eating a healthy diet, watching your weight, and checking your blood pressure and blood sugar levels.  Get screened for cancer and depression. Make sure that you are up to date with all your vaccines. This information is not intended to replace advice given to you by your health care provider. Make sure you discuss any questions you have with your health care provider. Document Released: 01/18/2006 Document Revised: 11/19/2018 Document Reviewed: 11/19/2018 Elsevier Patient Education  2020 [employer].

## 2019-10-27 LAB — COMPREHENSIVE METABOLIC PANEL
ALT: 7 U/L (ref 0–35)
AST: 16 U/L (ref 0–37)
Albumin: 4.3 g/dL (ref 3.5–5.2)
Alkaline Phosphatase: 53 U/L (ref 39–117)
BUN: 11 mg/dL (ref 6–23)
CO2: 27 mEq/L (ref 19–32)
Calcium: 9.8 mg/dL (ref 8.4–10.5)
Chloride: 103 mEq/L (ref 96–112)
Creatinine, Ser: 0.73 mg/dL (ref 0.40–1.20)
GFR: 80.27 mL/min (ref >60.00)
Glucose, Bld: 84 mg/dL (ref 70–99)
Potassium: 4.5 mEq/L (ref 3.5–5.1)
Sodium: 138 mEq/L (ref 135–145)
Total Bilirubin: 0.3 mg/dL (ref 0.2–1.2)
Total Protein: 6.8 g/dL (ref 6.0–8.3)

## 2019-10-27 LAB — CBC WITH DIFFERENTIAL/PLATELET
Basophils Absolute: 0.1 10*3/uL (ref 0.0–0.1)
Basophils Relative: 0.8 % (ref 0.0–3.0)
Eosinophils Absolute: 0.1 10*3/uL (ref 0.0–0.7)
Eosinophils Relative: 0.6 % (ref 0.0–5.0)
HCT: 40.2 % (ref 36.0–46.0)
Hemoglobin: 13.5 g/dL (ref 12.0–15.0)
Lymphocytes Relative: 34.1 % (ref 12.0–46.0)
Lymphs Abs: 3.1 10*3/uL (ref 0.7–4.0)
MCHC: 33.6 g/dL (ref 30.0–36.0)
MCV: 93.5 fl (ref 78.0–100.0)
Monocytes Absolute: 0.7 10*3/uL (ref 0.1–1.0)
Monocytes Relative: 7.3 % (ref 3.0–12.0)
Neutro Abs: 5.2 10*3/uL (ref 1.4–7.7)
Neutrophils Relative %: 57.2 % (ref 43.0–77.0)
Platelets: 217 10*3/uL (ref 150.0–400.0)
RBC: 4.3 Mil/uL (ref 3.87–5.11)
RDW: 14.1 % (ref 11.5–15.5)
WBC: 9.1 10*3/uL (ref 4.0–10.5)

## 2019-10-27 LAB — SARS-COV-2 IGG: SARS-COV-2 IgG: 46.83

## 2019-10-27 LAB — MICROALBUMIN / CREATININE URINE RATIO
Creatinine,U: 22.5 mg/dL
Microalb Creat Ratio: 3.1 mg/g (ref 0.0–30.0)
Microalb, Ur: <0.7 mg/dL (ref 0.0–1.9)

## 2019-10-27 LAB — TSH: TSH: 2.96 u[IU]/mL (ref 0.35–4.50)

## 2019-10-27 LAB — LIPID PANEL
Cholesterol: 256 mg/dL — ABNORMAL HIGH (ref 0–200)
HDL: 64 mg/dL (ref >39.00)
LDL Cholesterol: 152 mg/dL — ABNORMAL HIGH (ref 0–99)
NonHDL: 191.95
Total CHOL/HDL Ratio: 4
Triglycerides: 200 mg/dL — ABNORMAL HIGH (ref 0.0–149.0)
VLDL: 40 mg/dL (ref 0.0–40.0)

## 2019-10-27 LAB — C-REACTIVE PROTEIN: CRP: <1 mg/dL (ref 0.5–20.0)

## 2019-10-27 LAB — SEDIMENTATION RATE: Sed Rate: 13 mm/hr (ref 0–30)

## 2019-11-11 ENCOUNTER — Telehealth: Payer: Self-pay | Admitting: *Deleted

## 2019-11-11 ENCOUNTER — Other Ambulatory Visit (HOSPITAL_COMMUNITY): Payer: Self-pay | Admitting: Interventional Radiology

## 2019-11-11 ENCOUNTER — Telehealth (HOSPITAL_COMMUNITY): Payer: Self-pay

## 2019-11-11 DIAGNOSIS — I729 Aneurysm of unspecified site: Secondary | ICD-10-CM

## 2019-11-11 NOTE — Telephone Encounter (Signed)
Called to schedule f/u mri, no answer, left vm. AW 

## 2019-11-11 NOTE — Telephone Encounter (Signed)
Copied from [place] [id number]. Topic: General - Call Back - No Documentation >> Nov 11, 2019 11:36 AM Erick Blinks wrote: Reason for CRM: Pt's form for wellness credit has been rejected because date/date of testing is missing. Pt is requesting urgent fu Best contact: [phone]

## 2019-11-11 NOTE — Telephone Encounter (Signed)
LMTCB

## 2019-11-12 NOTE — Telephone Encounter (Signed)
Spoke with pt to let her know that I would put the date on the form and fax it to them again.

## 2019-11-25 ENCOUNTER — Ambulatory Visit (HOSPITAL_COMMUNITY)
Admission: RE | Admit: 2019-11-25 | Discharge: 2019-11-25 | Disposition: A | Discharge status: Home / Self Care | Payer: 59 | Source: Ambulatory Visit | Attending: Interventional Radiology | Admitting: Interventional Radiology

## 2019-11-25 ENCOUNTER — Other Ambulatory Visit: Payer: Self-pay

## 2019-11-25 DIAGNOSIS — I729 Aneurysm of unspecified site: Secondary | ICD-10-CM

## 2019-11-26 ENCOUNTER — Telehealth (HOSPITAL_COMMUNITY): Payer: Self-pay

## 2019-11-26 ENCOUNTER — Other Ambulatory Visit (HOSPITAL_COMMUNITY): Payer: Self-pay | Admitting: Interventional Radiology

## 2019-11-26 DIAGNOSIS — R55 Syncope and collapse: Secondary | ICD-10-CM

## 2019-11-26 DIAGNOSIS — R519 Headache, unspecified: Secondary | ICD-10-CM

## 2019-11-26 DIAGNOSIS — I729 Aneurysm of unspecified site: Secondary | ICD-10-CM

## 2019-11-26 NOTE — Telephone Encounter (Signed)
Pt called with concerns regarding her recent mra. She saw the results on mychart and wants to know what Dr. Estanislado Pandy says. I have sent a message to our PA to review and will give her a call back. Pt agreed with this plan.

## 2019-11-28 ENCOUNTER — Other Ambulatory Visit: Payer: Self-pay | Admitting: Internal Medicine

## 2019-11-30 ENCOUNTER — Ambulatory Visit (HOSPITAL_COMMUNITY)
Admission: RE | Admit: 2019-11-30 | Discharge: 2019-11-30 | Disposition: A | Discharge status: Home / Self Care | Payer: 59 | Source: Ambulatory Visit | Attending: Interventional Radiology | Admitting: Interventional Radiology

## 2019-11-30 ENCOUNTER — Other Ambulatory Visit: Payer: Self-pay

## 2019-11-30 DIAGNOSIS — R55 Syncope and collapse: Secondary | ICD-10-CM

## 2019-11-30 DIAGNOSIS — I729 Aneurysm of unspecified site: Secondary | ICD-10-CM

## 2019-11-30 DIAGNOSIS — R519 Headache, unspecified: Secondary | ICD-10-CM

## 2019-11-30 NOTE — Progress Notes (Signed)
    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 Authorizing Provider  ALPRAZolam (XANAX) 1 MG tablet TAKE 1 TABLET BY MOUTH AT BEDTIME AS NEEDED FOR ANXIETY OR SLEEP (NEED OFFICE VISIT) 11/30/19   Crecencio Mc, MD  escitalopram (LEXAPRO) 10 MG tablet TAKE 1 TABLET BY MOUTH EVERY DAY 05/21/19   Crecencio Mc, MD  ezetimibe (ZETIA) 10 MG tablet Take 1 tablet (10 mg total) by mouth daily. Patient not taking: Reported on 10/26/2019 10/22/18   Crecencio Mc, MD  ibuprofen (ADVIL,MOTRIN) 200 MG tablet Take 800 mg by mouth every 6 (six) hours as needed for headache.    [provider]  meloxicam (MOBIC) 15 MG tablet TAKE 1 TABLET BY MOUTH EVERY DAY 05/21/19   Crecencio Mc, MD     Vital Signs: There were no vitals taken for this visit.  Physical Exam Musculoskeletal:        General: Normal range of motion.     Comments: walking into to room independently  Neurological:     Mental Status: She is alert and oriented to person, place, and time.  Psychiatric:        Behavior: Behavior normal.     Imaging: No results found.  Labs:  CBC: Recent Labs    10/26/19 1553  WBC 9.1  HGB 13.5  HCT 40.2  PLT 217.0    COAGS: No results for input(s): INR, APTT in the last 8760 hours.  BMP: Recent Labs    10/26/19 1553  NA 138  K 4.5  CL 103  CO2 27  GLUCOSE 84  BUN 11  CALCIUM 9.8  CREATININE 0.73    LIVER FUNCTION  TESTS: Recent Labs    10/26/19 1553  BILITOT 0.3  AST 16  ALT 7  ALKPHOS 53  PROT 6.8  ALBUMIN 4.3    Assessment and Plan:  Basilar tip aneurysm embolization 2016 Follows with Dr Estanislado Pandy MRA 11/25/19: IMPRESSION: 1. 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 2. 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 3. Otherwise normal MRA [place].  Dr Estanislado Pandy discussed stable findings Answered all questions to satisfaction  Follow up 1 yr MRI/MRA Pt has good understanding of plan  Electronically Signed: Lavonia Drafts, PA-C 11/30/2019, 2:02 PM   I spent a total of 25 Minutes at the the patient's bedside AND on the patient's hospital floor or unit, greater than 50% of which was counseling/coordinating care for basilar tip aneurysm embolization

## 2019-11-30 NOTE — Telephone Encounter (Signed)
Refilled: 08/17/2019 Last OV: 10/26/2019 Next OV: not scheduled

## 2020-02-22 ENCOUNTER — Other Ambulatory Visit: Payer: Self-pay | Admitting: Internal Medicine

## 2020-06-15 ENCOUNTER — Ambulatory Visit: Payer: 59 | Admitting: Internal Medicine

## 2020-06-16 ENCOUNTER — Other Ambulatory Visit: Payer: Self-pay

## 2020-06-16 ENCOUNTER — Ambulatory Visit (INDEPENDENT_AMBULATORY_CARE_PROVIDER_SITE_OTHER): Payer: 59

## 2020-06-16 ENCOUNTER — Encounter: Payer: Self-pay | Admitting: Internal Medicine

## 2020-06-16 ENCOUNTER — Ambulatory Visit: Payer: 59 | Admitting: Internal Medicine

## 2020-06-16 VITALS — BP 124/78 | HR 54 | Temp 97.4°F | Ht 62.99 in | Wt 122.6 lb

## 2020-06-16 DIAGNOSIS — M5442 Lumbago with sciatica, left side: Secondary | ICD-10-CM | POA: Diagnosis not present

## 2020-06-16 DIAGNOSIS — U071 COVID-19: Secondary | ICD-10-CM

## 2020-06-16 DIAGNOSIS — R634 Abnormal weight loss: Secondary | ICD-10-CM

## 2020-06-16 DIAGNOSIS — I671 Cerebral aneurysm, nonruptured: Secondary | ICD-10-CM

## 2020-06-16 DIAGNOSIS — M5431 Sciatica, right side: Secondary | ICD-10-CM | POA: Diagnosis not present

## 2020-06-16 DIAGNOSIS — Z8616 Personal history of COVID-19: Secondary | ICD-10-CM

## 2020-06-16 MED ORDER — PREDNISONE 10 MG PO TABS: ORAL_TABLET | ORAL | 0 refills | Status: DC

## 2020-06-16 MED ORDER — BUPROPION HCL ER (XL) 150 MG PO TB24: 150.0000 mg | ORAL_TABLET | Freq: Every day | ORAL | 4 refills | Status: DC

## 2020-06-16 MED ORDER — TIZANIDINE HCL 4 MG PO TABS: 4.0000 mg | ORAL_TABLET | Freq: Four times a day (QID) | ORAL | 0 refills | Status: DC | PRN

## 2020-06-16 NOTE — Patient Instructions (Addendum)
You should not lose any more weight!  Supplement your diet with a protein shake daily   Adding wellbutrin to lexapro for your depression.  TAKE THE WELLBUTRIN IN THE MORNING   TAKE MELATONIN N AT DINNER TIME INSTEAD OF 9 PM   PREDNISONE, . TIZANIDINE AND TYLENOL (1000 MG EVERY 12 HOURS ) FOR THE NEXT 10 DAYS   Exercise: (supported back extension, standing)  Stand against a counter or sofa with your buttocks resting on the edge and your hands on the edge as well on either side of your back  Slowly lean backwards,  Bending from the waist, until you feel slight discomfort. Restore yourself to vertical position (up straight) Repeat the back extension 10 times , each time extending a little farther).  What should you expect? The pain should recede from the calf/thigh/buttocks but may localize to the lower back  If it does not,  Or if it makes the leg pain worse, STOP doing it.   If it results in improvement,  Repeat the exercise every 2-3 hours while awake and STOP THE OTHER EXERCISES that do the opposite motion (back flexion)

## 2020-06-16 NOTE — Progress Notes (Signed)
Subjective:  Patient ID: Katrina Woods, female    DOB: [birth year]  Age: 64 y.o. MRN: [id number]  CC: The primary encounter diagnosis was Acute midline low back pain with left-sided sciatica. Diagnoses of COVID-19 virus detected, Aneurysm, cerebral, nonruptured, Sciatica of right side, Personal history of covid-19, and Weight loss were also pertinent to this visit.  HPI [name] presents for evaluation of multiple pain complaints  Back pain started 1-2 weeks ago midline lumbosacral area , and radiates to groin and left inner thigh . Wants to avoid narcotics  Post COVID WEIGHT LOSS DUE TO ANOSMIA AND DYSGEUSIA. Since November .  Working from home initially,  Managed the fatigue. Now back to work.    Some depression,  Lost several freidnds to covid,  Dog had to be euthanized.  Cerebral aneurysm:      Outpatient Medications Prior to Visit  Medication Sig Dispense Refill  . ALPRAZolam (XANAX) 1 MG tablet TAKE 1 TABLET BY MOUTH AT BEDTIME AS NEEDED FOR ANXIETY OR SLEEP (NEED OFFICE VISIT) 30 tablet 0  . escitalopram (LEXAPRO) 10 MG tablet TAKE 1 TABLET BY MOUTH EVERY DAY 90 tablet 1  . ezetimibe (ZETIA) 10 MG tablet Take 1 tablet (10 mg total) by mouth daily. 30 tablet 3  . ibuprofen (ADVIL,MOTRIN) 200 MG tablet Take 800 mg by mouth every 6 (six) hours as needed for headache.    . meloxicam (MOBIC) 15 MG tablet TAKE 1 TABLET BY MOUTH EVERY DAY 90 tablet 1   Facility-Administered Medications Prior to Visit  Medication Dose Route Frequency Provider Last Rate Last Admin  . 0.9 %  sodium chloride infusion  500 mL Intravenous Once Ladene Artist, MD        Review of Systems;  Patient denies headache, fevers, malaise, unintentional weight loss, skin rash, eye pain, sinus congestion and sinus pain, sore throat, dysphagia,  hemoptysis , cough, dyspnea, wheezing, chest pain, palpitations, orthopnea, edema, abdominal pain, nausea, melena, diarrhea, constipation, flank  pain, dysuria, hematuria, urinary  Frequency, nocturia, numbness, tingling, seizures,  Focal weakness, Loss of consciousness,  Tremor, insomnia, depression, anxiety, and suicidal ideation.      Objective:  BP 124/78 (BP Location: Left Arm, Patient Position: Sitting)   Pulse (!) 54   Temp (!) 97.4 F (36.3 C)   Ht 5' 2.99" (1.6 m)   Wt 122 lb 9.6 oz (55.6 kg)   SpO2 97%   BMI 21.72 kg/m   BP Readings from Last 3 Encounters:  06/16/20 124/78  10/26/19 138/82  01/02/19 102/70    Wt Readings from Last 3 Encounters:  06/16/20 122 lb 9.6 oz (55.6 kg)  10/26/19 129 lb (58.5 kg)  01/02/19 134 lb 8 oz (61 kg)    General appearance: alert, cooperative and appears stated age Ears: normal TM's and external ear canals both ears Throat: lips, mucosa, and tongue normal; teeth and gums normal Neck: no adenopathy, no carotid bruit, supple, symmetrical, trachea midline and thyroid not enlarged, symmetric, no tenderness/mass/nodules Back: symmetric, no curvature. ROM normal. No CVA tenderness. Lungs: clear to auscultation bilaterally Heart: regular rate and rhythm, S1, S2 normal, no murmur, click, rub or gallop Abdomen: soft, non-tender; bowel sounds normal; no masses,  no organomegaly Pulses: 2+ and symmetric Skin: Skin color, texture, turgor normal. No rashes or lesions Lymph nodes: Cervical, supraclavicular, and axillary nodes normal.  No results found for: HGBA1C  Lab Results  Component Value Date   CREATININE 0.73 10/26/2019   CREATININE 0.93 10/31/2018  CREATININE 0.84 10/14/2017    Lab Results  Component Value Date   WBC 9.1 10/26/2019   HGB 13.5 10/26/2019   HCT 40.2 10/26/2019   PLT 217.0 10/26/2019   GLUCOSE 84 10/26/2019   CHOL 256 (H) 10/26/2019   TRIG 200.0 (H) 10/26/2019   HDL 64.00 10/26/2019   LDLDIRECT 166.0 10/14/2017   LDLCALC 152 (H) 10/26/2019   ALT 7 10/26/2019   AST 16 10/26/2019   NA 138 10/26/2019   K 4.5 10/26/2019   CL 103 10/26/2019    CREATININE 0.73 10/26/2019   BUN 11 10/26/2019   CO2 27 10/26/2019   TSH 2.96 10/26/2019   INR 0.99 01/27/2016   MICROALBUR <0.7 10/26/2019    No results found.  Assessment & Plan:   Problem List Items Addressed This Visit      Unprioritized   Aneurysm, cerebral, nonruptured    Given her history of tobacco abuse  , her 10 year risk of coronary artery disease is elevated and supported by the presence of atherosclerosis is also noted on plain films.  She did not tolerate atorvastatin due to joint pain, but is tolerating  Zetia.  She will return for baseline fasting labs .  Lab Results  Component Value Date   CHOL 256 (H) 10/26/2019   HDL 64.00 10/26/2019   LDLCALC 152 (H) 10/26/2019   LDLDIRECT 166.0 10/14/2017   TRIG 200.0 (H) 10/26/2019   CHOLHDL 4 10/26/2019         Sciatica of right side    Plain films of lumbar spine were  ordered today to show degenerative changes at the L5-S1 level and anterolisthesis .  Prednisone taper,  Back extension exercises.       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 Infection was in November.       Weight loss    Secondary to anosmia and dysgeusia post COVID infection.         Personal history of covid-19    She continues to report anosmia and dysgeusia which has resulted in significant weight loss.        Other Visit Diagnoses    Acute midline low back pain with left-sided sciatica    -  Primary   Relevant Medications   predniSONE (DELTASONE) 10 MG tablet   tiZANidine (ZANAFLEX) 4 MG tablet   Other Relevant Orders   DG Lumbar Spine Complete (Completed)      I am having Zenovia Jarred start on buPROPion, predniSONE, and tiZANidine. I am also having her maintain her ibuprofen, ezetimibe, meloxicam, ALPRAZolam, and escitalopram. We will continue to  administer sodium chloride.  Meds ordered this encounter  Medications  . buPROPion (WELLBUTRIN XL) 150 MG 24 hr tablet    Sig: Take 1 tablet (150 mg total) by mouth daily.    Dispense:  30 tablet    Refill:  4  . predniSONE (DELTASONE) 10 MG tablet    Sig: 6 tablets daily for 3 days, then reduce by 1 tablet daily until gone    Dispense:  33 tablet    Refill:  0  . tiZANidine (ZANAFLEX) 4 MG tablet    Sig: Take 1 tablet (4 mg total) by mouth every 6 (six) hours as needed for muscle spasms.    Dispense:  30 tablet  Refill:  0    I provided  30 minutes of  face-to-face time during this encounter reviewing patient's current problems and past surgeries, labs and imaging studies, providing counseling on the above mentioned problems , and coordination  of care .  There are no discontinued medications.  Follow-up: No follow-ups on file.   Crecencio Mc, MD

## 2020-06-16 NOTE — Assessment & Plan Note (Signed)
She continues to suffer from anosmia and loss of taste and has lost weight. Infection was in November.

## 2020-06-19 DIAGNOSIS — Z8616 Personal history of COVID-19: Secondary | ICD-10-CM | POA: Insufficient documentation

## 2020-06-19 NOTE — Assessment & Plan Note (Signed)
Given her history of tobacco abuse  , her 10 year risk of coronary artery disease is elevated and supported by the presence of atherosclerosis is also noted on plain films.  She did not tolerate atorvastatin due to joint pain, but is tolerating  Zetia.  She will return for baseline fasting labs .  Lab Results  Component Value Date   CHOL 256 (H) 10/26/2019   HDL 64.00 10/26/2019   LDLCALC 152 (H) 10/26/2019   LDLDIRECT 166.0 10/14/2017   TRIG 200.0 (H) 10/26/2019   CHOLHDL 4 10/26/2019

## 2020-06-19 NOTE — Assessment & Plan Note (Signed)
She continues to report anosmia and dysgeusia which has resulted in significant weight loss.

## 2020-06-19 NOTE — Assessment & Plan Note (Addendum)
Plain films of lumbar spine were  ordered today to show degenerative changes at the L5-S1 level and anterolisthesis .  Prednisone taper,  Back extension exercises.

## 2020-06-19 NOTE — Assessment & Plan Note (Addendum)
Secondary to anosmia and dysgeusia post COVID infection.

## 2020-06-27 DIAGNOSIS — M5431 Sciatica, right side: Secondary | ICD-10-CM

## 2020-06-27 DIAGNOSIS — L720 Epidermal cyst: Secondary | ICD-10-CM

## 2020-06-27 NOTE — Addendum Note (Signed)
Addended by: Crecencio Mc on: 06/27/2020 02:31 PM   Modules accepted: Orders

## 2020-07-04 ENCOUNTER — Telehealth: Payer: Self-pay | Admitting: Internal Medicine

## 2020-07-04 NOTE — Telephone Encounter (Signed)
lft vm for pt to call ofc to schedule MRI

## 2020-07-08 ENCOUNTER — Other Ambulatory Visit: Payer: Self-pay | Admitting: Internal Medicine

## 2020-07-22 ENCOUNTER — Ambulatory Visit: Payer: 59

## 2020-07-28 ENCOUNTER — Ambulatory Visit: Payer: 59

## 2020-08-11 ENCOUNTER — Ambulatory Visit: Admission: RE | Admit: 2020-08-11 | Payer: 59 | Source: Ambulatory Visit

## 2020-08-22 ENCOUNTER — Telehealth: Payer: Self-pay | Admitting: Internal Medicine

## 2020-08-22 ENCOUNTER — Encounter: Payer: Self-pay | Admitting: Internal Medicine

## 2020-08-22 DIAGNOSIS — I700 Atherosclerosis of aorta: Secondary | ICD-10-CM | POA: Insufficient documentation

## 2020-09-08 ENCOUNTER — Other Ambulatory Visit: Payer: Self-pay | Admitting: Internal Medicine

## 2020-12-05 ENCOUNTER — Other Ambulatory Visit (HOSPITAL_COMMUNITY): Payer: Self-pay | Admitting: Interventional Radiology

## 2020-12-05 ENCOUNTER — Telehealth (HOSPITAL_COMMUNITY): Payer: Self-pay

## 2020-12-05 DIAGNOSIS — I671 Cerebral aneurysm, nonruptured: Secondary | ICD-10-CM

## 2020-12-05 NOTE — Telephone Encounter (Signed)
Called to schedule mri/mra, no answer, left vm. AW  

## 2020-12-19 ENCOUNTER — Telehealth (HOSPITAL_COMMUNITY): Payer: Self-pay

## 2020-12-19 NOTE — Telephone Encounter (Signed)
Called to reschedule mri, no answer, left vm. AW  

## 2020-12-23 ENCOUNTER — Ambulatory Visit (HOSPITAL_COMMUNITY): Payer: 59

## 2020-12-23 ENCOUNTER — Encounter (HOSPITAL_COMMUNITY): Payer: Self-pay

## 2020-12-23 ENCOUNTER — Ambulatory Visit (HOSPITAL_COMMUNITY): Admission: RE | Admit: 2020-12-23 | Payer: 59 | Source: Ambulatory Visit

## 2021-01-03 ENCOUNTER — Encounter: Payer: Self-pay | Admitting: *Deleted

## 2021-03-10 ENCOUNTER — Other Ambulatory Visit: Payer: Self-pay

## 2021-03-10 ENCOUNTER — Ambulatory Visit (INDEPENDENT_AMBULATORY_CARE_PROVIDER_SITE_OTHER): Payer: PPO | Admitting: Internal Medicine

## 2021-03-10 ENCOUNTER — Ambulatory Visit (INDEPENDENT_AMBULATORY_CARE_PROVIDER_SITE_OTHER): Payer: PPO

## 2021-03-10 VITALS — Temp 98.0°F | Resp 15 | Ht 62.0 in | Wt 126.2 lb

## 2021-03-10 DIAGNOSIS — R55 Syncope and collapse: Secondary | ICD-10-CM

## 2021-03-10 DIAGNOSIS — G8929 Other chronic pain: Secondary | ICD-10-CM | POA: Diagnosis not present

## 2021-03-10 DIAGNOSIS — M5412 Radiculopathy, cervical region: Secondary | ICD-10-CM | POA: Diagnosis not present

## 2021-03-10 DIAGNOSIS — M542 Cervicalgia: Secondary | ICD-10-CM | POA: Diagnosis not present

## 2021-03-10 MED ORDER — TIZANIDINE HCL 4 MG PO TABS
4.0000 mg | ORAL_TABLET | Freq: Four times a day (QID) | ORAL | 0 refills | Status: DC | PRN
Start: 2021-03-10 — End: 2021-11-21

## 2021-03-10 MED ORDER — CELECOXIB 200 MG PO CAPS
200.0000 mg | ORAL_CAPSULE | Freq: Two times a day (BID) | ORAL | 0 refills | Status: DC
Start: 2021-03-10 — End: 2021-03-14

## 2021-03-10 MED ORDER — TRAMADOL HCL 50 MG PO TABS: 50.0000 mg | ORAL_TABLET | Freq: Four times a day (QID) | ORAL | 0 refills | Status: DC | PRN

## 2021-03-10 NOTE — Progress Notes (Signed)
Subjective:  Patient ID: Katrina Woods, female    DOB: Jan 13, 1956  Age: 65 y.o. MRN: [id number]  CC: The primary encounter diagnosis was Cervical radiculopathy, chronic. Diagnoses of Chronic midline posterior neck pain and Syncope and collapse were also pertinent to this visit.  HPI Katrina Woods presents with multiple acute issues  This visit occurred during the SARS-CoV-2 public health emergency.  Safety protocols were in place, including screening questions prior to the visit, additional usage of staff PPE, and extensive cleaning of exam room while observing appropriate contact time as indicated for disinfecting solutions.   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 She denies  numbness or tingling .  No loss of strength  2) syncopal episode on Sunday, occurred during granddaughter's birthday party  while standing at kitchen sink.  Had been standing for a new minutes before symptoms started. No dizziness ,  No new onset headache,  But was having her "usual"  headache.  No Loss of control of bowel or bladder.   Felt tingly,  Then  Fainted LOC was estimated by witnesses to have lasted  4 or 5 seconds . Was not fasting.  Had been exerting self at granddaughter's birthday party . had not slept well the day before , has not been  using CPAP .     Outpatient Medications Prior to Visit  Medication Sig Dispense Refill  . ALPRAZolam (XANAX) 1 MG tablet TAKE 1 TABLET BY MOUTH AT BEDTIME AS NEEDED FOR ANXIETY OR SLEEP (NEED OFFICE VISIT) 30 tablet 0  . escitalopram (LEXAPRO) 10 MG tablet TAKE 1 TABLET BY MOUTH EVERY DAY 90 tablet 1  . ezetimibe (ZETIA) 10 MG tablet Take 1 tablet  (10 mg total) by mouth daily. 30 tablet 3  . ibuprofen (ADVIL,MOTRIN) 200 MG tablet Take 800 mg by mouth every 6 (six) hours as needed for headache.    . meloxicam (MOBIC) 15 MG tablet TAKE 1 TABLET BY MOUTH EVERY DAY 90 tablet 1  . tiZANidine (ZANAFLEX) 4 MG tablet Take 1 tablet (4 mg total) by mouth every 6 (six) hours as needed for muscle spasms. 30 tablet 0  . buPROPion (WELLBUTRIN XL) 150 MG 24 hr tablet TAKE 1 TABLET BY MOUTH EVERY DAY (Patient not taking: Reported on 03/10/2021) 90 tablet 2  . predniSONE (DELTASONE) 10 MG tablet 6 tablets daily for 3 days, then reduce by 1 tablet daily until gone (Patient not taking: Reported on 03/10/2021) 33 tablet 0   Facility-Administered Medications Prior to Visit  Medication Dose Route Frequency Provider Last Rate Last Admin  . 0.9 %  sodium chloride infusion  500 mL Intravenous Once Ladene Artist, MD        Review of Systems;  Patient denies headache, fevers, malaise, unintentional weight loss, skin rash, eye pain, sinus congestion and sinus pain, sore throat, dysphagia,  hemoptysis , cough, dyspnea, wheezing, chest pain, palpitations, orthopnea, edema, abdominal pain, nausea, melena, diarrhea, constipation, flank pain, dysuria, hematuria, urinary  Frequency, nocturia, numbness, tingling, seizures,  Focal weakness, Loss of consciousness,  Tremor, insomnia, depression, anxiety, and suicidal ideation.      Objective:  Temp 98 F (36.7 C) (Oral)  Resp 15   Ht 5\' 2"  (1.575 m)   Wt 126 lb 3.2 oz (57.2 kg)   BMI 23.08 kg/m   BP Readings from Last 3 Encounters:  06/16/20 124/78  10/26/19 138/82  01/02/19 102/70    Wt Readings from Last 3 Encounters:  03/10/21 126 lb 3.2 oz (57.2 kg)  06/16/20 122 lb 9.6 oz (55.6 kg)  10/26/19 129 lb (58.5 kg)    General appearance: alert, cooperative and appears stated age Ears: normal TM's and external ear canals both ears Throat: lips, mucosa, and tongue normal; teeth and gums normal Neck: no  adenopathy, no carotid bruit, supple, symmetrical, trachea midline and thyroid not enlarged, symmetric, no tenderness/mass/nodules Back: symmetric, no curvature. ROM normal. No CVA tenderness. Lungs: clear to auscultation bilaterally Heart: regular rate and rhythm, S1, S2 normal, no murmur, click, rub or gallop Abdomen: soft, non-tender; bowel sounds normal; no masses,  no organomegaly Pulses: 2+ and symmetric Skin: Skin color, texture, turgor normal. No rashes or lesions Lymph nodes: Cervical, supraclavicular, and axillary nodes normal. Neuro:  awake and interactive with normal mood and affect. Higher cortical functions are normal. Speech is clear without word-finding difficulty or dysarthria. Extraocular movements are intact. Visual fields of both eyes are grossly intact. PERRL.  Sensation to light touch is grossly intact bilaterally of upper and lower extremities. Motor examination shows 4+/5 symmetric hand grip and upper extremity and 5/5 lower extremity strength. There is no pronation or drift. Gait is non-ataxic    No results found for: HGBA1C  Lab Results  Component Value Date   CREATININE 0.73 10/26/2019   CREATININE 0.93 10/31/2018   CREATININE 0.84 10/14/2017    Lab Results  Component Value Date   WBC 9.1 10/26/2019   HGB 13.5 10/26/2019   HCT 40.2 10/26/2019   PLT 217.0 10/26/2019   GLUCOSE 84 10/26/2019   CHOL 256 (H) 10/26/2019   TRIG 200.0 (H) 10/26/2019   HDL 64.00 10/26/2019   LDLDIRECT 166.0 10/14/2017   LDLCALC 152 (H) 10/26/2019   ALT 7 10/26/2019   AST 16 10/26/2019   NA 138 10/26/2019   K 4.5 10/26/2019   CL 103 10/26/2019   CREATININE 0.73 10/26/2019   BUN 11 10/26/2019   CO2 27 10/26/2019   TSH 2.96 10/26/2019   INR 0.99 01/27/2016   MICROALBUR <0.7 10/26/2019    No results found.  Assessment & Plan:   Problem List Items Addressed This Visit      Unprioritized   Chronic midline posterior neck pain    Recent recurrence ; workup in progress.   Neurologic exam is normal.  celebrex 200 mg bid x 7 days,, then once daily thereafter.  Tramadol and zanaflex for pain and spasm . Plain films done today         Relevant Medications   celecoxib (CELEBREX) 200 MG capsule   traMADol (ULTRAM) 50 MG tablet   tiZANidine (ZANAFLEX) 4 MG tablet   Syncope and collapse    Etiology unclear.  No provocation by history , she will return for EKG, labs .  Need to rule out orthostasis.  Anemia arrhtyhmia        Other Visit Diagnoses    Cervical radiculopathy, chronic    -  Primary   Relevant Medications   tiZANidine (ZANAFLEX) 4 MG tablet   Other Relevant Orders   DG Cervical Spine Complete      I have discontinued Hassan Rowan R. Wedemeyer's predniSONE and buPROPion. I am also having her start on  celecoxib, traMADol, and tiZANidine. Additionally, I am having her maintain her ibuprofen, ezetimibe, meloxicam, ALPRAZolam, tiZANidine, and escitalopram. We will continue to administer sodium chloride.  Meds ordered this encounter  Medications  . celecoxib (CELEBREX) 200 MG capsule    Sig: Take 1 capsule (200 mg total) by mouth 2 (two) times daily.    Dispense:  60 capsule    Refill:  0  . traMADol (ULTRAM) 50 MG tablet    Sig: Take 1 tablet (50 mg total) by mouth every 6 (six) hours as needed for up to 7 days.    Dispense:  28 tablet    Refill:  0  . tiZANidine (ZANAFLEX) 4 MG tablet    Sig: Take 1 tablet (4 mg total) by mouth every 6 (six) hours as needed for muscle spasms.    Dispense:  30 tablet    Refill:  0    Medications Discontinued During This Encounter  Medication Reason  . buPROPion (WELLBUTRIN XL) 150 MG 24 hr tablet   . predniSONE (DELTASONE) 10 MG tablet     Follow-up: No follow-ups on file.   Crecencio Mc, MD

## 2021-03-12 DIAGNOSIS — R55 Syncope and collapse: Secondary | ICD-10-CM

## 2021-03-12 DIAGNOSIS — M542 Cervicalgia: Secondary | ICD-10-CM | POA: Insufficient documentation

## 2021-03-12 DIAGNOSIS — G8929 Other chronic pain: Secondary | ICD-10-CM | POA: Insufficient documentation

## 2021-03-12 HISTORY — DX: Syncope and collapse: R55

## 2021-03-12 NOTE — Assessment & Plan Note (Signed)
Recent recurrence ; workup in progress.  Neurologic exam is normal.  celebrex 200 mg bid x 7 days,, then once daily thereafter.  Tramadol and zanaflex for pain and spasm . Plain films done today

## 2021-03-12 NOTE — Assessment & Plan Note (Signed)
Etiology unclear.  No provocation by history , she will return for EKG, labs .  Need to rule out orthostasis.  Anemia arrhtyhmia

## 2021-03-14 ENCOUNTER — Other Ambulatory Visit: Payer: Self-pay

## 2021-03-14 ENCOUNTER — Encounter: Payer: Self-pay | Admitting: Internal Medicine

## 2021-03-14 ENCOUNTER — Ambulatory Visit (INDEPENDENT_AMBULATORY_CARE_PROVIDER_SITE_OTHER): Payer: PPO | Admitting: Internal Medicine

## 2021-03-14 VITALS — BP 126/72 | HR 69 | Temp 97.8°F | Resp 15 | Ht 62.0 in | Wt 125.0 lb

## 2021-03-14 DIAGNOSIS — R55 Syncope and collapse: Secondary | ICD-10-CM

## 2021-03-14 DIAGNOSIS — G8929 Other chronic pain: Secondary | ICD-10-CM | POA: Diagnosis not present

## 2021-03-14 DIAGNOSIS — R9431 Abnormal electrocardiogram [ECG] [EKG]: Secondary | ICD-10-CM | POA: Insufficient documentation

## 2021-03-14 DIAGNOSIS — M542 Cervicalgia: Secondary | ICD-10-CM | POA: Diagnosis not present

## 2021-03-14 MED ORDER — PANTOPRAZOLE SODIUM 40 MG PO TBEC: 40.0000 mg | DELAYED_RELEASE_TABLET | Freq: Every day | ORAL | 3 refills | Status: DC

## 2021-03-14 MED ORDER — ALPRAZOLAM 0.5 MG PO TABS: ORAL_TABLET | ORAL | 2 refills | Status: DC

## 2021-03-14 MED ORDER — TRAMADOL HCL 50 MG PO TABS: 50.0000 mg | ORAL_TABLET | Freq: Four times a day (QID) | ORAL | 0 refills | Status: AC | PRN

## 2021-03-14 NOTE — Progress Notes (Signed)
Subjective:  Patient ID: Katrina Woods, female    DOB: [birth year]  Age: 65 y.o. MRN: [id number]  CC: The primary encounter diagnosis was Syncope, unspecified syncope type. Diagnoses of Shortened PR interval, Chronic midline posterior neck pain, and Syncope and collapse were also pertinent to this visit.  HPI [name] presents for follow up on 1) cervical spine pain radiculopathy to both shoulders and 2) recent unprovoked syncopal episode  This visit occurred during the SARS-CoV-2 public health emergency.  Safety protocols were in place, including screening questions prior to the visit, additional usage of staff PPE, and extensive cleaning of exam room while observing appropriate contact time as indicated for disinfecting solutions.     1) cervical spine pain:  Reviewed plain films With patient today.  She denies weakness and numbness.  Picked up meds Sunday night.     Didn't  Sleep very well despite taking tramadol and zanaflex and meloxicam for the past 2 nights .  Requesting alprazolam refill for chronic insomnia,  Uses 0.5 tablet prn/  .  Reviewed prior pain management regimen years ago for post procedural headache. tolelrated lower doses of gabapentin  at 100 mg tid .  Wants to avoid prednisone  2) syncope:  Orthostatics negative today. EKG done  Short PR segment noted. Reviewed symptoms presyncope:  Felt tingly all over , then dizzy, then fainted .  No recent cardiac workup.   Outpatient Medications Prior to Visit  Medication Sig Dispense Refill  . escitalopram (LEXAPRO) 10 MG tablet TAKE 1 TABLET BY MOUTH EVERY DAY 90 tablet 1  . ezetimibe (ZETIA) 10 MG tablet Take 1 tablet (10 mg total) by mouth daily. 30 tablet 3  . tiZANidine (ZANAFLEX) 4 MG tablet Take 1 tablet (4 mg total) by mouth every 6 (six) hours as needed for muscle spasms. 30 tablet 0  . tiZANidine (ZANAFLEX) 4 MG tablet Take 1 tablet (4 mg total) by mouth every 6 (six) hours as needed for muscle  spasms. 30 tablet 0  . ALPRAZolam (XANAX) 1 MG tablet TAKE 1 TABLET BY MOUTH AT BEDTIME AS NEEDED FOR ANXIETY OR SLEEP (NEED OFFICE VISIT) 30 tablet 0  . celecoxib (CELEBREX) 200 MG capsule Take 1 capsule (200 mg total) by mouth 2 (two) times daily. 60 capsule 0  . ibuprofen (ADVIL,MOTRIN) 200 MG tablet Take 800 mg by mouth every 6 (six) hours as needed for headache.    . meloxicam (MOBIC) 15 MG tablet TAKE 1 TABLET BY MOUTH EVERY DAY 90 tablet 1  . traMADol (ULTRAM) 50 MG tablet Take 1 tablet (50 mg total) by mouth every 6 (six) hours as needed for up to 7 days. 28 tablet 0   Facility-Administered Medications Prior to Visit  Medication Dose Route Frequency Provider Last Rate Last Admin  . 0.9 %  sodium chloride infusion  500 mL Intravenous Once Ladene Artist, MD        Review of Systems;  Patient denies headache, fevers, malaise, unintentional weight loss, skin rash, eye pain, sinus congestion and sinus pain, sore throat, dysphagia,  hemoptysis , cough, dyspnea, wheezing, chest pain, palpitations, orthopnea, edema, abdominal pain, nausea, melena, diarrhea, constipation, flank pain, dysuria, hematuria, urinary  Frequency, nocturia, numbness, tingling, seizures,  Focal weakness, Loss of consciousness,  Tremor, insomnia, depression, anxiety, and suicidal ideation.      Objective:  BP 126/72 (BP Location: Left Arm, Patient Position: Sitting, Cuff Size: Normal)   Pulse 69   Temp 97.8 F (36.6 C) (Oral)  Resp 15   Ht 5\' 2"  (1.575 m)   Wt 125 lb (56.7 kg)   BMI 22.86 kg/m   BP Readings from Last 3 Encounters:  03/14/21 126/72  06/16/20 124/78  10/26/19 138/82    Wt Readings from Last 3 Encounters:  03/14/21 125 lb (56.7 kg)  03/10/21 126 lb 3.2 oz (57.2 kg)  06/16/20 122 lb 9.6 oz (55.6 kg)    General appearance: alert, cooperative and appears stated age Ears: normal TM's and external ear canals both ears Throat: lips, mucosa, and tongue normal; teeth and gums normal Neck:  no adenopathy, no carotid bruit, supple, symmetrical, trachea midline and thyroid not enlarged, symmetric, no tenderness/mass/nodules Back: symmetric, no curvature. ROM normal. No CVA tenderness. Lungs: clear to auscultation bilaterally Heart: regular rate and rhythm, S1, S2 normal, no murmur, click, rub or gallop Abdomen: soft, non-tender; bowel sounds normal; no masses,  no organomegaly Pulses: 2+ and symmetric Skin: Skin color, texture, turgor normal. No rashes or lesions Lymph nodes: Cervical, supraclavicular, and axillary nodes normal.  No results found for: HGBA1C  Lab Results  Component Value Date   CREATININE 0.73 10/26/2019   CREATININE 0.93 10/31/2018   CREATININE 0.84 10/14/2017    Lab Results  Component Value Date   WBC 9.1 10/26/2019   HGB 13.5 10/26/2019   HCT 40.2 10/26/2019   PLT 217.0 10/26/2019   GLUCOSE 84 10/26/2019   CHOL 256 (H) 10/26/2019   TRIG 200.0 (H) 10/26/2019   HDL 64.00 10/26/2019   LDLDIRECT 166.0 10/14/2017   LDLCALC 152 (H) 10/26/2019   ALT 7 10/26/2019   AST 16 10/26/2019   NA 138 10/26/2019   K 4.5 10/26/2019   CL 103 10/26/2019   CREATININE 0.73 10/26/2019   BUN 11 10/26/2019   CO2 27 10/26/2019   TSH 2.96 10/26/2019   INR 0.99 01/27/2016   MICROALBUR <0.7 10/26/2019    No results found.  Assessment & Plan:   Problem List Items Addressed This Visit      Unprioritized   Chronic midline posterior neck pain    Secondary to degenerative disk disease at multiple levels by plain films done April 1.  Neurologic exam is normalWill manage with gabapentin, zanaflex, meloxicam, tramadol and tylenol       Relevant Medications   traMADol (ULTRAM) 50 MG tablet (Start on 03/17/2021)   Shortened PR interval    Found during workup for recent syncopal episode.  Referring to [facility] cardiology      Syncope and collapse    Arrhythmia suspected.  Not orthostatic. I have ordered and reviewed a 12 lead EKG and find that she has a short PR  interval but otherwise  in sinus rhythm.         Other Visit Diagnoses    Syncope, unspecified syncope type    -  Primary   Relevant Orders   EKG 12-Lead (Completed)      I have discontinued Hassan Rowan R. Kot's ibuprofen, meloxicam, and celecoxib. I have also changed her ALPRAZolam and traMADol. Additionally, I am having her start on pantoprazole. Lastly, I am having her maintain her ezetimibe, tiZANidine, escitalopram, and tiZANidine. We will continue to administer sodium chloride.  Meds ordered this encounter  Medications  . ALPRAZolam (XANAX) 0.5 MG tablet    Sig: TAKE 1 TABLET BY MOUTH AT BEDTIME AS NEEDED FOR ANXIETY OR SLEEP    Dispense:  30 tablet    Refill:  2    Not to exceed 5 additional fills before  03/06/2021NEEDS NEW RX..  . pantoprazole (PROTONIX) 40 MG tablet    Sig: Take 1 tablet (40 mg total) by mouth daily.    Dispense:  30 tablet    Refill:  3  . traMADol (ULTRAM) 50 MG tablet    Sig: Take 1 tablet (50 mg total) by mouth every 6 (six) hours as needed.    Dispense:  120 tablet    Refill:  0    Medications Discontinued During This Encounter  Medication Reason  . meloxicam (MOBIC) 15 MG tablet   . ibuprofen (ADVIL,MOTRIN) 200 MG tablet   . ALPRAZolam (XANAX) 1 MG tablet   . celecoxib (CELEBREX) 200 MG capsule   . traMADol (ULTRAM) 50 MG tablet Reorder    Follow-up: No follow-ups on file.   Crecencio Mc, MD

## 2021-03-14 NOTE — Patient Instructions (Addendum)
I am referring you to [facility] cardiology because your EKG may suggest a conduction abnormality that could result in a really rapid heart rate that could cause you to faint "short PR interval"  For your degenerative disk disease, you my combine the following medications:  Tramadol every 6 hours AS NEEDED (controlled substance) Tylenol 1000 mg every 12 hours  (do this no matter what) Gabapentin 100 mg every 8 hours and increase gradually as tolerated meloxicam 15 mg daily  zanaflex at nighttime ( muscle relaxer)  I am adding pantoprazole to protect your stomach from the meloxicam.  Take it Daily IN THE MORNING    If the pain starts radiating to the scalp or below the elbow,  Let me know If you get weakness  Or tingling of the hand  Let me know

## 2021-03-14 NOTE — Assessment & Plan Note (Signed)
Found during workup for recent syncopal episode.  Referring to [facility] cardiology

## 2021-03-14 NOTE — Assessment & Plan Note (Signed)
Secondary to degenerative disk disease at multiple levels by plain films done April 1.  Neurologic exam is normalWill manage with gabapentin, zanaflex, meloxicam, tramadol and tylenol

## 2021-03-14 NOTE — Assessment & Plan Note (Signed)
Arrhythmia suspected.  Not orthostatic. I have ordered and reviewed a 12 lead EKG and find that she has a short PR interval but otherwise  in sinus rhythm.

## 2021-03-15 ENCOUNTER — Telehealth (HOSPITAL_COMMUNITY): Payer: Self-pay

## 2021-03-15 NOTE — Telephone Encounter (Signed)
Called to schedule mri, no answer, left vm. AW 

## 2021-03-23 ENCOUNTER — Ambulatory Visit: Payer: PPO | Admitting: Internal Medicine

## 2021-04-08 ENCOUNTER — Other Ambulatory Visit: Payer: Self-pay | Admitting: Internal Medicine

## 2021-04-27 ENCOUNTER — Ambulatory Visit: Payer: PPO

## 2021-05-17 ENCOUNTER — Other Ambulatory Visit: Payer: Self-pay | Admitting: Internal Medicine

## 2021-07-13 ENCOUNTER — Other Ambulatory Visit: Payer: Self-pay | Admitting: Internal Medicine

## 2021-09-07 ENCOUNTER — Telehealth: Payer: Self-pay | Admitting: Internal Medicine

## 2021-09-07 NOTE — Telephone Encounter (Signed)
Appointment For: Katrina Woods ([id number]) Visit Type: OFFICE VISIT (1004)   10/17/2021    1:30 PM  30 mins.  Crecencio Mc, MD       [facility]   Patient Comments: Severe neck pain that is consistent while taking celebrex daily, and need for anti depressent prescription.

## 2021-09-08 ENCOUNTER — Ambulatory Visit (HOSPITAL_COMMUNITY)
Admission: RE | Admit: 2021-09-08 | Discharge: 2021-09-08 | Disposition: A | Discharge status: Home / Self Care | Payer: PPO | Source: Ambulatory Visit | Attending: Interventional Radiology | Admitting: Interventional Radiology

## 2021-09-08 ENCOUNTER — Other Ambulatory Visit: Payer: Self-pay

## 2021-09-08 DIAGNOSIS — I720 Aneurysm of carotid artery: Secondary | ICD-10-CM | POA: Diagnosis not present

## 2021-09-08 DIAGNOSIS — I671 Cerebral aneurysm, nonruptured: Secondary | ICD-10-CM

## 2021-09-08 DIAGNOSIS — G9389 Other specified disorders of brain: Secondary | ICD-10-CM | POA: Diagnosis not present

## 2021-09-08 NOTE — Telephone Encounter (Signed)
Patient has chronic history of cervical pain chronic history of anxiety/depression.

## 2021-09-11 ENCOUNTER — Other Ambulatory Visit (HOSPITAL_COMMUNITY): Payer: Self-pay | Admitting: Interventional Radiology

## 2021-09-11 DIAGNOSIS — I729 Aneurysm of unspecified site: Secondary | ICD-10-CM

## 2021-09-14 ENCOUNTER — Ambulatory Visit (HOSPITAL_COMMUNITY)
Admission: RE | Admit: 2021-09-14 | Discharge: 2021-09-14 | Disposition: A | Discharge status: Home / Self Care | Payer: PPO | Source: Ambulatory Visit | Attending: Interventional Radiology | Admitting: Interventional Radiology

## 2021-09-14 ENCOUNTER — Other Ambulatory Visit: Payer: Self-pay

## 2021-09-14 DIAGNOSIS — I729 Aneurysm of unspecified site: Secondary | ICD-10-CM

## 2021-09-14 DIAGNOSIS — I720 Aneurysm of carotid artery: Secondary | ICD-10-CM | POA: Diagnosis not present

## 2021-09-17 ENCOUNTER — Other Ambulatory Visit: Payer: Self-pay | Admitting: Internal Medicine

## 2021-09-18 HISTORY — PX: IR RADIOLOGIST EVAL & MGMT: IMG5224

## 2021-10-17 ENCOUNTER — Other Ambulatory Visit: Payer: Self-pay

## 2021-10-17 ENCOUNTER — Ambulatory Visit (INDEPENDENT_AMBULATORY_CARE_PROVIDER_SITE_OTHER): Payer: PPO | Admitting: Internal Medicine

## 2021-10-17 ENCOUNTER — Encounter: Payer: Self-pay | Admitting: Internal Medicine

## 2021-10-17 VITALS — BP 120/74 | HR 79 | Temp 96.5°F | Ht 62.0 in | Wt 132.4 lb

## 2021-10-17 DIAGNOSIS — M542 Cervicalgia: Secondary | ICD-10-CM | POA: Diagnosis not present

## 2021-10-17 DIAGNOSIS — G8929 Other chronic pain: Secondary | ICD-10-CM

## 2021-10-17 DIAGNOSIS — R7301 Impaired fasting glucose: Secondary | ICD-10-CM

## 2021-10-17 DIAGNOSIS — E785 Hyperlipidemia, unspecified: Secondary | ICD-10-CM | POA: Diagnosis not present

## 2021-10-17 DIAGNOSIS — Z720 Tobacco use: Secondary | ICD-10-CM

## 2021-10-17 DIAGNOSIS — I700 Atherosclerosis of aorta: Secondary | ICD-10-CM

## 2021-10-17 DIAGNOSIS — R5383 Other fatigue: Secondary | ICD-10-CM

## 2021-10-17 DIAGNOSIS — Z1231 Encounter for screening mammogram for malignant neoplasm of breast: Secondary | ICD-10-CM

## 2021-10-17 DIAGNOSIS — F418 Other specified anxiety disorders: Secondary | ICD-10-CM | POA: Diagnosis not present

## 2021-10-17 MED ORDER — TRAMADOL HCL 50 MG PO TABS: 50.0000 mg | ORAL_TABLET | Freq: Two times a day (BID) | ORAL | 0 refills | Status: DC | PRN

## 2021-10-17 MED ORDER — DULOXETINE HCL 20 MG PO CPEP: 20.0000 mg | ORAL_CAPSULE | Freq: Every day | ORAL | 1 refills | Status: DC

## 2021-10-17 MED ORDER — ALPRAZOLAM 0.5 MG PO TABS: ORAL_TABLET | ORAL | 2 refills | Status: DC

## 2021-10-17 MED ORDER — EZETIMIBE 10 MG PO TABS: 10.0000 mg | ORAL_TABLET | Freq: Every day | ORAL | 3 refills | Status: DC

## 2021-10-17 NOTE — Assessment & Plan Note (Addendum)
Reviewed findings of prior CT scan today..  Patient has a history of statin myalgias,  But  is willing to o resume Zetia given  her concurrent tobacco abuse.

## 2021-10-17 NOTE — Assessment & Plan Note (Signed)
Advised to resume Zetia

## 2021-10-17 NOTE — Assessment & Plan Note (Signed)
She has resumed daily use of tobacco .  She declines pharmacotherapy

## 2021-10-17 NOTE — Assessment & Plan Note (Signed)
Suggest trial of cymbalta given her chronic pain and difficulty sleeping . Starting dose 20 mg daily,  Increase to 40 mg after 2 weeks

## 2021-10-17 NOTE — Progress Notes (Signed)
Subjective:  Patient ID: Katrina Woods, female    DOB: June 21, 1956  Age: 65 y.o. MRN: [id number]  CC: The primary encounter diagnosis was Hyperlipidemia LDL goal <100. Diagnoses of Fatigue, unspecified type, Impaired fasting glucose, Chronic midline posterior neck pain, Aortic atherosclerosis ([place]), Encounter for screening mammogram for malignant neoplasm of breast, Depression with anxiety, and Tobacco abuse were also pertinent to this visit.  HPI Katrina Woods presents for  Chief Complaint  Patient presents with   Follow-up    6 month follow up on depression and severe neck pain. Pt is taking the celebrex.   This visit occurred during the SARS-CoV-2 public health emergency.  Safety protocols were in place, including screening questions prior to the visit, additional usage of staff PPE, and extensive cleaning of exam room while observing appropriate contact time as indicated for disinfecting solutions.   1) Depression:  Katrina Woods has been taking lexapro on and off since 2013,  initially for management of menopausal symptoms ad eventually for management of depression.  Katrina Woods was added at one point several years ago for concurrent help with tobacco cessation but was not tolerate. She presents today with return of symptoms after stopping lexapro o her own about ten months ago.  In retrospect she thinks she was functioning better on lexapro . She is in constant pain from her cervical dise disease and has been sleeping poorly Trouble falling asleep .  Using alprazolam prn insomnia.  She is requesting restart of antidepressant.    2) Neck pain:  The pain is bilateral always present but varying in severity.  when severe she notes limited rotation of head in both directions that limits her ability to drive safely.  The pain radiates to occiput and causes headaches.  Radiates to left elbow for the past month .  States that she is in severe pain the majority of the time . evaluated in  April with films:  "Advanced degenerative changes of the cervical spine greatest at 4-C5, C5-C6, and C6-C7 levels."  Taking celebrex daily,  ran out of tizanidine which was helping.  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 Most recent study demonstrates approximately 3.5 x 3 mm neck remnant compared to approximately 3 mm x 2 mm. 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  She has agreed to undergo diagnostic angiogram with curative intent  4) Aortic atherosclerosis :  Discussed need to resume zetia givne her statin myalgias given documented evidence of moderate  atherosclerosis in the aorta noted on recent  CT of abdomen and  pelvis and the prognostic implications of this finding.    5) Tobacco abuse:  she has resumed smoking    Outpatient Medications Prior to Visit  Medication Sig Dispense Refill   celecoxib (CELEBREX) 200 MG capsule TAKE 1 CAPSULE BY MOUTH TWICE A DAY 60 capsule 0   pantoprazole (PROTONIX) 40 MG tablet TAKE 1 TABLET BY MOUTH EVERY DAY 30 tablet 1   tiZANidine (ZANAFLEX) 4 MG tablet Take 1 tablet (4 mg total) by mouth every 6 (six)  hours as needed for muscle spasms. 30 tablet 0   ALPRAZolam (XANAX) 0.5 MG tablet TAKE 1 TABLET BY MOUTH AT BEDTIME AS NEEDED FOR ANXIETY OR SLEEP 30 tablet 2   escitalopram (LEXAPRO) 10 MG tablet TAKE 1 TABLET BY MOUTH EVERY DAY (Patient not taking: Reported on 10/17/2021) 90 tablet 1   ezetimibe (ZETIA) 10 MG tablet Take 1 tablet (10 mg total) by mouth daily. (Patient not taking: Reported on 10/17/2021) 30 tablet 3    tiZANidine (ZANAFLEX) 4 MG tablet Take 1 tablet (4 mg total) by mouth every 6 (six) hours as needed for muscle spasms. (Patient not taking: Reported on 10/17/2021) 30 tablet 0   Facility-Administered Medications Prior to Visit  Medication Dose Route Frequency Provider Last Rate Last Admin   0.9 %  sodium chloride infusion  500 mL Intravenous Once Ladene Artist, MD        Review of Systems;  Patient denies headache, fevers, malaise, unintentional weight loss, skin rash, eye pain, sinus congestion and sinus pain, sore throat, dysphagia,  hemoptysis , cough, dyspnea, wheezing, chest pain, palpitations, orthopnea, edema, abdominal pain, nausea, melena, diarrhea, constipation, flank pain, dysuria, hematuria, urinary  Frequency, nocturia, numbness, tingling, seizures,  Focal weakness, Loss of consciousness,  Tremor, insomnia, depression, anxiety, and suicidal ideation.      Objective:  BP 120/74 (BP Location: Left Arm, Patient Position: Sitting, Cuff Size: Normal)   Pulse 79   Temp (!) 96.5 F (35.8 C) (Temporal)   Ht 5' 2"  (1.575 m)   Wt 132 lb 6.4 oz (60.1 kg)   SpO2 98%   BMI 24.22 kg/m   BP Readings from Last 3 Encounters:  10/17/21 120/74  03/14/21 126/72  06/16/20 124/78    Wt Readings from Last 3 Encounters:  10/17/21 132 lb 6.4 oz (60.1 kg)  03/14/21 125 lb (56.7 kg)  03/10/21 126 lb 3.2 oz (57.2 kg)    General appearance: alert, cooperative and appears stated age Ears: normal TM's and external ear canals both ears Throat: lips, mucosa, and tongue normal; teeth and gums normal Neck: no adenopathy, no carotid bruit, supple, symmetrical, trachea midline and thyroid not enlarged, symmetric, no tenderness/mass/nodules Back: symmetric, no curvature. ROM normal. No CVA tenderness. Lungs: clear to auscultation bilaterally Heart: regular rate and rhythm, S1, S2 normal, no murmur, click, rub or gallop Abdomen: soft, non-tender; bowel sounds normal; no masses,  no  organomegaly Pulses: 2+ and symmetric Skin: Skin color, texture, turgor normal. No rashes or lesions Lymph nodes: Cervical, supraclavicular, and axillary nodes normal.  No results found for: HGBA1C  Lab Results  Component Value Date   CREATININE 0.73 10/26/2019   CREATININE 0.93 10/31/2018   CREATININE 0.84 10/14/2017    Lab Results  Component Value Date   WBC 9.1 10/26/2019   HGB 13.5 10/26/2019   HCT 40.2 10/26/2019   PLT 217.0 10/26/2019   GLUCOSE 84 10/26/2019   CHOL 256 (H) 10/26/2019   TRIG 200.0 (H) 10/26/2019   HDL 64.00 10/26/2019   LDLDIRECT 166.0 10/14/2017   LDLCALC 152 (H) 10/26/2019   ALT 7 10/26/2019   AST 16 10/26/2019   NA 138 10/26/2019   K 4.5 10/26/2019   CL 103 10/26/2019   CREATININE 0.73 10/26/2019   BUN 11 10/26/2019   CO2 27 10/26/2019   TSH 2.96 10/26/2019   INR 0.99 01/27/2016   MICROALBUR <0.7 10/26/2019    No results found.  Assessment & Plan:   Problem List Items Addressed This Visit  Hyperlipidemia LDL goal <100 - Primary    Advised to resume Zetia       Relevant Medications   ezetimibe (ZETIA) 10 MG tablet   Other Relevant Orders   Lipid Profile   Tobacco abuse    She has resumed daily use of tobacco .  She declines pharmacotherapy      Depression with anxiety    Suggest trial of cymbalta given her chronic pain and difficulty sleeping . Starting dose 20 mg daily,  Increase to 40 mg after 2 weeks      Relevant Medications   DULoxetine (CYMBALTA) 20 MG capsule   ALPRAZolam (XANAX) 0.5 MG tablet   Aortic atherosclerosis ([facility])    Reviewed findings of prior CT scan today..  Patient has a history of statin myalgias,  But  is willing to o resume Zetia given  her concurrent tobacco abuse.       Relevant Medications   ezetimibe (ZETIA) 10 MG tablet   Neck pain, musculoskeletal    reviewed plain films she has degenerative at multiple levels . she has a mild radiculopathy  Resulting in pain rediating to her elbow without  weakness of distal arm  .  Continue celebrex, resume tramadol bid  And add muscle relaxer       Other Visit Diagnoses     Fatigue, unspecified type       Relevant Orders   CBC with Differential/Platelet   TSH   Impaired fasting glucose       Relevant Orders   Comp Met (CMET)   HgB A1c   Encounter for screening mammogram for malignant neoplasm of breast       Relevant Orders   MM 3D SCREEN BREAST BILATERAL       I am having Zenovia Jarred start on traMADol and DULoxetine. I am also having her maintain her escitalopram, tiZANidine, celecoxib, pantoprazole, ezetimibe, and ALPRAZolam. We will continue to administer sodium chloride.  Meds ordered this encounter  Medications   traMADol (ULTRAM) 50 MG tablet    Sig: Take 1 tablet (50 mg total) by mouth every 12 (twelve) hours as needed for up to 5 days.    Dispense:  15 tablet    Refill:  0   DULoxetine (CYMBALTA) 20 MG capsule    Sig: Take 1 capsule (20 mg total) by mouth daily.    Dispense:  90 capsule    Refill:  1   ezetimibe (ZETIA) 10 MG tablet    Sig: Take 1 tablet (10 mg total) by mouth daily.    Dispense:  30 tablet    Refill:  3   ALPRAZolam (XANAX) 0.5 MG tablet    Sig: TAKE 1 TABLET BY MOUTH AT BEDTIME AS NEEDED FOR ANXIETY OR SLEEP    Dispense:  30 tablet    Refill:  2    Not to exceed 5 additional fills before 03/06/2021NEEDS NEW RX..   I provided  45 minutes  on the day of this encounter reviewing patient's current problems and past surgeries, labs and imaging studies, providing counseling on the above mentioned problems I na face to face visit  , and coordination  of care .   Medications Discontinued During This Encounter  Medication Reason   tiZANidine (ZANAFLEX) 4 MG tablet Duplicate   ezetimibe (ZETIA) 10 MG tablet Reorder   ALPRAZolam (XANAX) 0.5 MG tablet Reorder    Follow-up: No follow-ups on file.   Crecencio Mc, MD

## 2021-10-17 NOTE — Assessment & Plan Note (Addendum)
reviewed plain films she has degenerative at multiple levels . she has a mild radiculopathy  Resulting in pain rediating to her elbow without weakness of distal arm  .  Continue celebrex, resume tramadol bid  And add muscle relaxer

## 2021-10-17 NOTE — Patient Instructions (Addendum)
For your neck pain :  I want you to start taking  2000 mg of acetominophen (tylenol) every day  In divided doses (500 mg every 6 hours  Or 1000 mg every 12 hours.)   I am prescribing  tramadol for pain not relieved with tylenol.    Start with 2 times daily  if needed   I will refill the muscle relaxer   Starting cymbalta with dinner  one tablet daily for the first week or 2 .   Increase to 40 mg after 2 weeks if tolerating   I will refill the alprazolam for "as needed " use   Follow up with a virtual visit in one month   Get your flu vaccine at your pharmacy  Return for fasting labs  Schedule your annual exam

## 2021-10-20 ENCOUNTER — Other Ambulatory Visit: Payer: Self-pay

## 2021-10-20 ENCOUNTER — Other Ambulatory Visit (INDEPENDENT_AMBULATORY_CARE_PROVIDER_SITE_OTHER): Payer: PPO

## 2021-10-20 DIAGNOSIS — R5383 Other fatigue: Secondary | ICD-10-CM | POA: Diagnosis not present

## 2021-10-20 DIAGNOSIS — R7301 Impaired fasting glucose: Secondary | ICD-10-CM | POA: Diagnosis not present

## 2021-10-20 DIAGNOSIS — E785 Hyperlipidemia, unspecified: Secondary | ICD-10-CM | POA: Diagnosis not present

## 2021-10-20 LAB — COMPREHENSIVE METABOLIC PANEL
ALT: 5 U/L (ref 0–35)
AST: 13 U/L (ref 0–37)
Albumin: 4.2 g/dL (ref 3.5–5.2)
Alkaline Phosphatase: 53 U/L (ref 39–117)
BUN: 15 mg/dL (ref 6–23)
CO2: 27 mEq/L (ref 19–32)
Calcium: 9.9 mg/dL (ref 8.4–10.5)
Chloride: 102 mEq/L (ref 96–112)
Creatinine, Ser: 0.88 mg/dL (ref 0.40–1.20)
GFR: 68.71 mL/min (ref >60.00)
Glucose, Bld: 87 mg/dL (ref 70–99)
Potassium: 4.8 mEq/L (ref 3.5–5.1)
Sodium: 137 mEq/L (ref 135–145)
Total Bilirubin: 0.5 mg/dL (ref 0.2–1.2)
Total Protein: 6.8 g/dL (ref 6.0–8.3)

## 2021-10-20 LAB — CBC WITH DIFFERENTIAL/PLATELET
Basophils Absolute: 0.1 10*3/uL (ref 0.0–0.1)
Basophils Relative: 1.1 % (ref 0.0–3.0)
Eosinophils Absolute: 0.1 10*3/uL (ref 0.0–0.7)
Eosinophils Relative: 2 % (ref 0.0–5.0)
HCT: 44 % (ref 36.0–46.0)
Hemoglobin: 14.8 g/dL (ref 12.0–15.0)
Lymphocytes Relative: 37.3 % (ref 12.0–46.0)
Lymphs Abs: 2.3 10*3/uL (ref 0.7–4.0)
MCHC: 33.6 g/dL (ref 30.0–36.0)
MCV: 91.3 fl (ref 78.0–100.0)
Monocytes Absolute: 0.5 10*3/uL (ref 0.1–1.0)
Monocytes Relative: 8.7 % (ref 3.0–12.0)
Neutro Abs: 3.2 10*3/uL (ref 1.4–7.7)
Neutrophils Relative %: 50.9 % (ref 43.0–77.0)
Platelets: 176 10*3/uL (ref 150.0–400.0)
RBC: 4.82 Mil/uL (ref 3.87–5.11)
RDW: 13.8 % (ref 11.5–15.5)
WBC: 6.2 10*3/uL (ref 4.0–10.5)

## 2021-10-20 LAB — LIPID PANEL
Cholesterol: 243 mg/dL — ABNORMAL HIGH (ref 0–200)
HDL: 56.2 mg/dL (ref >39.00)
LDL Cholesterol: 160 mg/dL — ABNORMAL HIGH (ref 0–99)
NonHDL: 186.69
Total CHOL/HDL Ratio: 4
Triglycerides: 133 mg/dL (ref 0.0–149.0)
VLDL: 26.6 mg/dL (ref 0.0–40.0)

## 2021-10-20 LAB — HEMOGLOBIN A1C: Hgb A1c MFr Bld: 5.8 % (ref 4.6–6.5)

## 2021-10-20 LAB — TSH: TSH: 2.93 u[IU]/mL (ref 0.35–5.50)

## 2021-10-29 ENCOUNTER — Other Ambulatory Visit: Payer: Self-pay | Admitting: Internal Medicine

## 2021-10-30 ENCOUNTER — Other Ambulatory Visit: Payer: Self-pay

## 2021-10-30 ENCOUNTER — Encounter: Payer: Self-pay | Admitting: Internal Medicine

## 2021-10-30 ENCOUNTER — Ambulatory Visit (INDEPENDENT_AMBULATORY_CARE_PROVIDER_SITE_OTHER): Payer: PPO | Admitting: Internal Medicine

## 2021-10-30 ENCOUNTER — Other Ambulatory Visit (HOSPITAL_COMMUNITY)
Admission: RE | Admit: 2021-10-30 | Discharge: 2021-10-30 | Disposition: A | Discharge status: Home / Self Care | Payer: PPO | Source: Ambulatory Visit | Attending: Internal Medicine | Admitting: Internal Medicine

## 2021-10-30 VITALS — BP 126/78 | HR 58 | Temp 96.5°F | Ht 63.47 in | Wt 136.8 lb

## 2021-10-30 DIAGNOSIS — M542 Cervicalgia: Secondary | ICD-10-CM | POA: Diagnosis not present

## 2021-10-30 DIAGNOSIS — Z124 Encounter for screening for malignant neoplasm of cervix: Secondary | ICD-10-CM | POA: Diagnosis not present

## 2021-10-30 DIAGNOSIS — Z1151 Encounter for screening for human papillomavirus (HPV): Secondary | ICD-10-CM | POA: Diagnosis not present

## 2021-10-30 DIAGNOSIS — F418 Other specified anxiety disorders: Secondary | ICD-10-CM

## 2021-10-30 DIAGNOSIS — Z639 Problem related to primary support group, unspecified: Secondary | ICD-10-CM | POA: Insufficient documentation

## 2021-10-30 DIAGNOSIS — Z23 Encounter for immunization: Secondary | ICD-10-CM | POA: Diagnosis not present

## 2021-10-30 DIAGNOSIS — Z780 Asymptomatic menopausal state: Secondary | ICD-10-CM

## 2021-10-30 DIAGNOSIS — Z01419 Encounter for gynecological examination (general) (routine) without abnormal findings: Secondary | ICD-10-CM | POA: Insufficient documentation

## 2021-10-30 DIAGNOSIS — Z0000 Encounter for general adult medical examination without abnormal findings: Secondary | ICD-10-CM

## 2021-10-30 MED ORDER — ZOSTER VAC RECOMB ADJUVANTED 50 MCG/0.5ML IM SUSR
0.5000 mL | Freq: Once | INTRAMUSCULAR | 1 refills | Status: AC
Start: 2021-10-30 — End: 2021-10-30

## 2021-10-30 NOTE — Assessment & Plan Note (Signed)
She has become involved in a relationship that lacks romantic interest for her due to her partner's self centeredness .  He is not abusive but is pushy and selfish.  counselling recommended; encouraged her to remain an advocate for herself

## 2021-10-30 NOTE — Progress Notes (Signed)
Patient ID: Katrina Woods, female    DOB: 1956-12-09  Age: 65 y.o. MRN: [id number]  The patient is here for follow up  and management of other chronic and acute problems.  This visit occurred during the SARS-CoV-2 public health emergency.  Safety protocols were in place, including screening questions prior to the visit, additional usage of staff PPE, and extensive cleaning of exam room while observing appropriate contact time as indicated for disinfecting solutions.     The risk factors are reflected in the social history.  The roster of all physicians providing medical care to patient - is listed in the Snapshot section of the chart.  Activities of daily living:  The patient is 100% independent in all ADLs: dressing, toileting, feeding as well as independent mobility  Home safety : The patient has smoke detectors in the home. They wear seatbelts.  There are no firearms at home. There is no violence in the home.   There is no risks for hepatitis, STDs or HIV. There is no   history of blood transfusion. They have no travel history to infectious disease endemic areas of the world.  The patient has seen their dentist in the last six month. They have seen their eye doctor in the last year. They admit to slight hearing difficulty with regard to whispered voices and some television programs.  They have deferred audiologic testing in the last year.  They do not  have excessive sun exposure. Discussed the need for sun protection: hats, long sleeves and use of sunscreen if there is significant sun exposure.   Diet: the importance of a healthy diet is discussed. They do have a healthy diet.  The benefits of regular aerobic exercise were discussed. She walks 4 times per week ,  20 minutes.   Depression screen: there are multiple signs and symptoms of depression- based on screen done last wek  irritability, change in appetite, anhedonia, sadness/tearfullness.  Cognitive assessment: the patient  manages all their financial and personal affairs and is actively engaged. They could relate day,date,year and events; recalled 2/3 objects at 3 minutes; performed clock-face test normally.  The following portions of the patient's history were reviewed and updated as appropriate: allergies, current medications, past family history, past medical history,  past surgical history, past social history  and problem list.  Visual acuity was not assessed per patient preference since she has regular follow up with her ophthalmologist. Hearing and body mass index were assessed and reviewed.   During the course of the visit the patient was educated and counseled about appropriate screening and preventive services including : fall prevention , diabetes screening, nutrition counseling, colorectal cancer screening, and recommended immunizations.    CC: The primary encounter diagnosis was Cervical cancer screening. Diagnoses of Postmenopausal estrogen deficiency, Need for pneumococcal vaccine, Relationship dysfunction, Encounter for preventive health examination, Depression with anxiety, Neck pain, musculoskeletal, and Encounter for Papanicolaou smear for cervical cancer screening were also pertinent to this visit.  1) Depression:  cymbalta prescribed on Nov 8 .  Sleeping better,  but wants to Increase dose to help manage anxiety/depression.  Aggravators include dysfunctional relationship with boyfriend of 1.5 years who has moved in with her and become quite dependent on her .  She is in conflict about the relationship,  has lost romantic interest in him,  but is unable to discuss her feelings with him because he walks out of the room when she tries to discuss her feelings.  There is no physical  or emotional abuse, but she feels they need counselling as a couple in order to open communication.  2) neck pain:  marginal improvement, but symptoms keeping her awake at night.  Reviewed current regimen.  She added the morning  tylenol dose and is taking cymbalta and MR at night but no tylenol.  Using tramadol sparingly.  History  Yulissa has a past medical history of Allergy, Aneurysm ([place]), Back pain, Bursitis of left hip (8-13), Depression, GERD (gastroesophageal reflux disease), Headache, Hiatal hernia (209), History of bronchitis (2015), History of colon polyps, History of kidney stones, History of shingles, Hyperlipidemia, Insomnia, and Weakness.   She has a past surgical history that includes Abdominal hysterectomy (2004); Knee cartilage surgery (Left, 1987); Cesarean section (11-25-82); Wisdom tooth extraction (1986); Cerebral aneurysm repair (2006); Colonoscopy; Esophagogastroduodenoscopy; Radiology with anesthesia (N/A, 05/11/2015); ir generic historical (10/23/2016); Polypectomy; and IR Radiologist Eval & Mgmt (09/18/2021).   Her family history includes Appendicitis in her father; Cancer in her father, maternal grandmother, and mother; Diabetes in her maternal grandmother and mother; Diabetes (age of onset: 23) in her brother; Heart block in her paternal grandfather; Heart disease in her maternal grandmother and mother; Hypertension in her brother, maternal grandmother, and mother; Stroke in her maternal grandfather.She reports that she has quit smoking. Her smoking use included cigarettes. She has never used smokeless tobacco. She reports current alcohol use of about 3.0 standard drinks per week. She reports that she does not use drugs.  Outpatient Medications Prior to Visit  Medication Sig Dispense Refill   ALPRAZolam (XANAX) 0.5 MG tablet TAKE 1 TABLET BY MOUTH AT BEDTIME AS NEEDED FOR ANXIETY OR SLEEP 30 tablet 2   DULoxetine (CYMBALTA) 20 MG capsule Take 1 capsule (20 mg total) by mouth daily. 90 capsule 1   escitalopram (LEXAPRO) 10 MG tablet TAKE 1 TABLET BY MOUTH EVERY DAY 90 tablet 1   ezetimibe (ZETIA) 10 MG tablet Take 1 tablet (10 mg total) by mouth daily. 30 tablet 3   pantoprazole (PROTONIX) 40 MG  tablet TAKE 1 TABLET BY MOUTH EVERY DAY 30 tablet 1   tiZANidine (ZANAFLEX) 4 MG tablet Take 1 tablet (4 mg total) by mouth every 6 (six) hours as needed for muscle spasms. 30 tablet 0   celecoxib (CELEBREX) 200 MG capsule TAKE 1 CAPSULE BY MOUTH TWICE A DAY 60 capsule 0   Facility-Administered Medications Prior to Visit  Medication Dose Route Frequency Provider Last Rate Last Admin   0.9 %  sodium chloride infusion  500 mL Intravenous Once Ladene Artist, MD        Review of Systems  Patient denies headache, fevers, malaise, unintentional weight loss, skin rash, eye pain, sinus congestion and sinus pain, sore throat, dysphagia,  hemoptysis , cough, dyspnea, wheezing, chest pain, palpitations, orthopnea, edema, abdominal pain, nausea, melena, diarrhea, constipation, flank pain, dysuria, hematuria, urinary  Frequency, nocturia, numbness, tingling, seizures,  Focal weakness, Loss of consciousness,  Tremor, insomnia, depression, anxiety, and suicidal ideation.     Objective:  BP 126/78 (BP Location: Left Arm, Patient Position: Sitting, Cuff Size: Normal)   Pulse (!) 58   Temp (!) 96.5 F (35.8 C) (Temporal)   Ht 5' 3.47" (1.612 m)   Wt 136 lb 12.8 oz (62.1 kg)   SpO2 97%   BMI 23.88 kg/m   Physical Exam General Appearance:    Alert, cooperative, no distress, appears stated age  Head:    Normocephalic, without obvious abnormality, atraumatic  Eyes:    PERRL, conjunctiva/corneas  clear, EOM's intact, fundi    benign, both eyes  Ears:    Normal TM's and external ear canals, both ears  Nose:   Nares normal, septum midline, mucosa normal, no drainage    or sinus tenderness  Throat:   Lips, mucosa, and tongue normal; teeth and gums normal  Neck:   Supple, symmetrical, trachea midline, no adenopathy;    thyroid:  no enlargement/tenderness/nodules; no carotid   bruit or JVD  Back:     Symmetric, no curvature, ROM normal, no CVA tenderness  Lungs:     Clear to auscultation bilaterally,  respirations unlabored  Chest Wall:    No tenderness or deformity   Heart:    Regular rate and rhythm, S1 and S2 normal, no murmur, rub   or gallop  Breast Exam:    No tenderness, masses, or nipple abnormality  Abdomen:     Soft, non-tender, bowel sounds active all four quadrants,    no masses, no organomegaly  Genitalia:    Pelvic: cervix normal in appearance, external genitalia normal, no adnexal masses or tenderness, no cervical motion tenderness, rectovaginal septum normal, uterus normal size, shape, and consistency and vagina normal without discharge  Extremities:   Extremities normal, atraumatic, no cyanosis or edema  Pulses:   2+ and symmetric all extremities  Skin:   Skin color, texture, turgor normal, no rashes or lesions  Lymph nodes:   Cervical, supraclavicular, and axillary nodes normal  Neurologic:   CNII-XII intact, normal strength, sensation and reflexes    throughout     Assessment & Plan:   Problem List Items Addressed This Visit     Depression with anxiety    Tolerating a  trial of cymbalta given her chronic pain and difficulty sleeping . She has been taking the starting dose 20 mg daily,  advised toi ncrease to 40 mg tomorrow      Encounter for preventive health examination    age appropriate education and counseling updated, referrals for preventative services and immunizations addressed, dietary and smoking counseling addressed, most recent labs reviewed.  I have personally reviewed and have noted:   1) the patient's medical and social history 2) The pt's use of alcohol, tobacco, and illicit drugs 3) The patient's current medications and supplements 4) Functional ability including ADL's, fall risk, home safety risk, hearing and visual impairment 5) Diet and physical activities 6) Evidence for depression or mood disorder 7) The patient's height, weight, and BMI have been recorded in the chart   I have made referrals, and provided counseling and education based  on review of the above      Neck pain, musculoskeletal    reviewed plain films:  she has degenerative at multiple levels . she has a mild radiculopathy  Resulting in pain rediating to her elbow without weakness of distal arm  .  Continue celebrex and MR, increase dosing of tylenol  To bid  And add tramadol if needed        Relationship dysfunction    She has become involved in a relationship that lacks romantic interest for her due to her partner's self centeredness .  He is not abusive but is pushy and selfish.  counselling recommended; encouraged her to remain an advocate for herself      Encounter for Papanicolaou smear for cervical cancer screening    PAP smear was done today       Other Visit Diagnoses     Cervical cancer screening    -  Primary   Relevant Orders   Cytology - PAP   Postmenopausal estrogen deficiency       Relevant Orders   DG Bone Density   Need for pneumococcal vaccine       Relevant Orders   Pneumococcal conjugate vaccine 20-valent (Prevnar 20) (Completed)       I spent 40  minutes dedicated to the care of this patient on the date of this encounter to include pre-visit review of her medical history,  most recent imaging studies, Face-to-face time with the patient , and post visit ordering of testing and therapeutics.   Meds ordered this encounter  Medications   Zoster Vaccine Adjuvanted [facility]) injection    Sig: Inject 0.5 mLs into the muscle once for 1 dose.    Dispense:  1 each    Refill:  1    There are no discontinued medications.  Follow-up: Return in about 3 months (around 01/30/2022).   Crecencio Mc, MD

## 2021-10-30 NOTE — Patient Instructions (Addendum)
Increase the cymbalta to 40 mg daily starting tomorrow  Add the 2nd dose of tylenol (1000 mg ) at dinnertime  If your pain is still severe at bedtime , you can add the tramadol 50 mg    Do not be less than half of your relationship;  your feelings matter.    Couples counselling may help clarify the issues   Your annual mammogram and DEXA scan have been ordered.  Please call  [facility]  to schedule your tests   The Tdap and Shingrix vaccines will be COVERED BY MEDICARE if you get them at your pharmacy after January 1  You are due for shingles this year,  tetanus in 2024

## 2021-10-30 NOTE — Assessment & Plan Note (Signed)

## 2021-10-30 NOTE — Assessment & Plan Note (Addendum)
Tolerating a  trial of cymbalta given her chronic pain and difficulty sleeping . She has been taking the starting dose 20 mg daily,  advised toi ncrease to 40 mg tomorrow

## 2021-10-31 DIAGNOSIS — Z124 Encounter for screening for malignant neoplasm of cervix: Secondary | ICD-10-CM | POA: Insufficient documentation

## 2021-10-31 NOTE — Assessment & Plan Note (Signed)
PAP smear was done today 

## 2021-10-31 NOTE — Assessment & Plan Note (Signed)
reviewed plain films:  she has degenerative at multiple levels . she has a mild radiculopathy  Resulting in pain rediating to her elbow without weakness of distal arm  .  Continue celebrex and MR, increase dosing of tylenol  To bid  And add tramadol if needed

## 2021-11-06 LAB — CYTOLOGY - PAP
Chlamydia: NEGATIVE
Comment: NEGATIVE
Comment: NEGATIVE
Comment: NEGATIVE
Comment: NORMAL
Diagnosis: NEGATIVE
High risk HPV: NEGATIVE
Neisseria Gonorrhea: NEGATIVE
Trichomonas: NEGATIVE

## 2021-11-13 DIAGNOSIS — H43393 Other vitreous opacities, bilateral: Secondary | ICD-10-CM | POA: Diagnosis not present

## 2021-11-13 DIAGNOSIS — H2513 Age-related nuclear cataract, bilateral: Secondary | ICD-10-CM | POA: Diagnosis not present

## 2021-11-13 DIAGNOSIS — H40013 Open angle with borderline findings, low risk, bilateral: Secondary | ICD-10-CM | POA: Diagnosis not present

## 2021-11-13 DIAGNOSIS — H5203 Hypermetropia, bilateral: Secondary | ICD-10-CM | POA: Diagnosis not present

## 2021-11-15 ENCOUNTER — Other Ambulatory Visit: Payer: Self-pay | Admitting: Internal Medicine

## 2021-11-15 NOTE — Telephone Encounter (Signed)
Refilled: 10/17/2021 Last OV: 10/30/2021 Next OV: 01/30/2022

## 2021-11-16 ENCOUNTER — Telehealth: Payer: PPO | Admitting: Internal Medicine

## 2021-11-16 ENCOUNTER — Telehealth (HOSPITAL_COMMUNITY): Payer: Self-pay | Admitting: Radiology

## 2021-11-16 ENCOUNTER — Other Ambulatory Visit (HOSPITAL_COMMUNITY): Payer: Self-pay | Admitting: Interventional Radiology

## 2021-11-16 ENCOUNTER — Other Ambulatory Visit: Payer: Self-pay | Admitting: Physician Assistant

## 2021-11-16 DIAGNOSIS — I671 Cerebral aneurysm, nonruptured: Secondary | ICD-10-CM

## 2021-11-16 MED ORDER — TICAGRELOR 90 MG PO TABS: 90.0000 mg | ORAL_TABLET | Freq: Two times a day (BID) | ORAL | 6 refills | Status: DC

## 2021-11-16 NOTE — Telephone Encounter (Signed)
Called pt to schedule the re-treatment of her brain aneurysm with Dr. Estanislado Pandy. Left VM. JM

## 2021-11-21 ENCOUNTER — Other Ambulatory Visit: Payer: Self-pay | Admitting: Rheumatology

## 2021-11-22 ENCOUNTER — Other Ambulatory Visit (HOSPITAL_COMMUNITY): Payer: Self-pay | Admitting: Physician Assistant

## 2021-11-22 ENCOUNTER — Other Ambulatory Visit: Payer: Self-pay | Admitting: Radiology

## 2021-11-22 ENCOUNTER — Other Ambulatory Visit: Payer: Self-pay | Admitting: Student

## 2021-11-22 ENCOUNTER — Encounter (HOSPITAL_COMMUNITY): Payer: Self-pay | Admitting: Interventional Radiology

## 2021-11-22 ENCOUNTER — Other Ambulatory Visit: Payer: Self-pay

## 2021-11-22 LAB — SARS CORONAVIRUS 2 (TAT 6-24 HRS): SARS Coronavirus 2: NEGATIVE

## 2021-11-22 MED ORDER — ASPIRIN 325 MG PO TABS: 325.0000 mg | ORAL_TABLET | Freq: Once | ORAL | Status: AC

## 2021-11-22 NOTE — Anesthesia Preprocedure Evaluation (Addendum)
Anesthesia Evaluation  Patient identified by MRN, date of birth, ID band Patient awake    Reviewed: Allergy & Precautions, NPO status , Patient's Chart, lab work & pertinent test results  Airway Mallampati: III  TM Distance: >3 FB Neck ROM: Full    Dental no notable dental hx.    Pulmonary sleep apnea , former smoker,    Pulmonary exam normal breath sounds clear to auscultation       Cardiovascular negative cardio ROS Normal cardiovascular exam Rhythm:Regular Rate:Normal  ECG: SR, rate 62   Neuro/Psych  Headaches, PSYCHIATRIC DISORDERS Anxiety Depression  Neuromuscular disease    GI/Hepatic Neg liver ROS, hiatal hernia, GERD  Medicated and Controlled,  Endo/Other  negative endocrine ROS  Renal/GU negative Renal ROS     Musculoskeletal negative musculoskeletal ROS (+)   Abdominal   Peds  Hematology HLD   Anesthesia Other Findings BRAIN ANEURYSM  Reproductive/Obstetrics                           Anesthesia Physical Anesthesia Plan  ASA: 3  Anesthesia Plan: General   Post-op Pain Management:    Induction: Intravenous  PONV Risk Score and Plan: 3 and Ondansetron, Dexamethasone and Treatment may vary due to age or medical condition  Airway Management Planned: Oral ETT  Additional Equipment: Arterial line  Intra-op Plan:   Post-operative Plan: Extubation in OR  Informed Consent: I have reviewed the patients History and Physical, chart, labs and discussed the procedure including the risks, benefits and alternatives for the proposed anesthesia with the patient or authorized representative who has indicated his/her understanding and acceptance.     Dental advisory given  Plan Discussed with: CRNA  Anesthesia Plan Comments: (Reviewed PAT note written 11/22/2021 by Myra Gianotti, PA-C. )       Anesthesia Quick Evaluation

## 2021-11-22 NOTE — Progress Notes (Signed)
Anesthesia Chart Review: Katrina Woods  Case: [id number] Date/Time: 11/23/21 0815   Procedure: IR WITH ANESTHESIA EMBOLIZATION   Anesthesia type: General   Pre-op diagnosis: BRAIN ANEURYSM   Location: [place] [facility] RADIOLOGY ROOM / [place] [facility]   Surgeons: Luanne Bras, MD       DISCUSSION: Patient is a 65 year old female scheduled for the above procedure. 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  She met with Dr. Herminio Heads eshwar on 09/18/2021 and expressed desire to proceed with a diagnostic arteriogram and retreatment as necessary for the suspected recanalized basilar apex aneurysm.   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  She is on Brilinta and ASA.  This is a same-day work-up, so labs and anesthesia team evaluation on the day of surgery.  11/21/2021 presurgical COVID-19 test was negative.   VS: Ht 5' 2"  (1.575 m)    Wt 60.8 kg    BMI 24.51 kg/m  BP Readings from Last 3 Encounters:  10/30/21 126/78  10/17/21 120/74  03/14/21 126/72   Pulse Readings from Last 3 Encounters:  10/30/21 (!) 58  10/17/21 79  03/14/21 69     PROVIDERS: Crecencio Mc, MD is PCP    LABS: Labs on the day of procedure as indicated/ordered. Currently, last results include: Lab Results  Component Value Date   WBC 6.2 10/20/2021   HGB 14.8 10/20/2021   HCT 44.0 10/20/2021   PLT 176.0 10/20/2021   GLUCOSE 87 10/20/2021   ALT 5 10/20/2021   AST 13 10/20/2021   NA 137 10/20/2021   K 4.8 10/20/2021   CL 102 10/20/2021    CREATININE 0.88 10/20/2021   BUN 15 10/20/2021   CO2 27 10/20/2021   TSH 2.93 10/20/2021   HGBA1C 5.8 10/20/2021     Home Sleep Test 04/01/18: Impressions: Moderate obstructive sleep apnea with AHI of 18, severe in supine position. Recommendations: Recommend AutoPap with pressure range of 5-20 cm H2O.   IMAGES: MRI/MRA Brain/Head 09/08/21: IMPRESSION: Brain MRI: 1. Stable non-contrast MRI appearance of the brain as compared to 09/13/2016. No evidence of acute intracranial abnormality. 2. Mild chronic small-vessel ischemic changes within the cerebral white matter. 3. 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 4. Trace fluid within the bilateral mastoid air cells.   MRA head: 1. Slight interval increase in size of a focus of recurrent flow/recanalization at the neck of the treated basilar tip aneurysm, now measuring 3-4 mm x 3 mm (previously measuring 3 mm x 2 mm. 2. Unchanged 2 mm inferiorly projecting aneurysm arising from the cavernous right ICA. 3. Unchanged 2 mm broad-based inferiorly projecting vascular protrusion arising from the cavernous left ICA, which may also reflect a small aneurysm. 4. No intracranial large vessel occlusion or proximal high-grade arterial stenosis.    EKG: 03/14/21 Sinus  Rhythm   Short PR (PRi = 116) BORDERLINE RHYTHM   CV: N/A  Past Medical History:  Diagnosis Date   Allergy    Aneurysm ([place])    cerebral   Back pain  Bursitis of left hip 07/2012   takes Meloxicam daily    Depression    takes Lexapro daily   GERD (gastroesophageal reflux disease)    takes Omeprazole daily   Headache    Hiatal hernia 01/2008   History of bronchitis 2015   History of colon polyps    benign   History of kidney stones    History of shingles    Hyperlipidemia    takes Atorvastatin daily   Insomnia    takes Xanax nightly   Sleep apnea    can not tolerate Cpap   Weakness     numbness and tingling-thinks its carpal tunnel    Past Surgical History:  Procedure Laterality Date   ABDOMINAL HYSTERECTOMY  2004   total   CEREBRAL ANEURYSM REPAIR  2006   Deveshwar, coil    CESAREAN SECTION  11/25/1982   COLONOSCOPY     DILATION AND CURETTAGE OF UTERUS     ESOPHAGOGASTRODUODENOSCOPY     IR GENERIC HISTORICAL  10/23/2016   IR RADIOLOGIST EVAL & MGMT 10/23/2016 [facility]-INTERV RAD   IR RADIOLOGIST EVAL & MGMT  09/18/2021   KNEE CARTILAGE SURGERY Left 1987   POLYPECTOMY     RADIOLOGY WITH ANESTHESIA N/A 05/11/2015   Procedure: ANEURYSM EMBOLIZATION;  Surgeon: Luanne Bras, MD;  Location: [place];  Service: Radiology;  Laterality: N/A;   [place]   all four extracted    MEDICATIONS:  0.9 %  sodium chloride infusion    acetaminophen (TYLENOL) 325 MG tablet   ALPRAZolam (XANAX) 0.5 MG tablet   aspirin EC 81 MG tablet   celecoxib (CELEBREX) 200 MG capsule   DULoxetine (CYMBALTA) 20 MG capsule   ezetimibe (ZETIA) 10 MG tablet   pantoprazole (PROTONIX) 40 MG tablet   ticagrelor (BRILINTA) 90 MG TABS tablet   escitalopram (LEXAPRO) 10 MG tablet    Myra Gianotti, PA-C Surgical Short Stay/Anesthesiology [facility] Phone [phone] [facility] Phone [phone] 11/22/2021 2:34 PM

## 2021-11-22 NOTE — Progress Notes (Signed)
PCP - Dr. Judieth Keens  Cardiologist - Denies  EP-Denies  Endocrine-Denies  Pulm-Denies  Chest x-ray - Denies  EKG - 03/14/21 (E)  Stress Test - Denies  ECHO - Denies  Cardiac Cath - Denies  AICD-na PM-na LOOP-na  Dialysis-Denies  Sleep Study - Yes- Positive CPAP - Denies- Pt states she cannot tolerate the machine  LABS- 11/21/21 (E): COVID- Neg 11/23/21: CBC w/D, BMP, PT  ASA- Cont. Brillinta-Cont.  ERAS- No  HA1C- Denies  Anesthesia- Yes- surgical order  Pt denies having chest pain, sob, or fever during the pre-op phone call. All instructions explained to the pt, with a verbal understanding of the material including: as of today, stop taking all Aspirin (unless instructed by your doctor) and Other Aspirin containing products, Vitamins, Fish oils, and Herbal medications. Also stop all NSAIDS i.e. Advil, Ibuprofen, Motrin, Aleve, Anaprox, Naproxen, BC, Goody Powders, and all Supplements.  Pt also instructed to wear a mask and social distance after being tested for COVID-19. The opportunity to ask questions was provided.    Coronavirus Screening  Have you experienced the following symptoms:  Cough yes/no: No Fever (>100.26F)  yes/no: No Runny nose yes/no: No Sore throat yes/no: No Difficulty breathing/shortness of breath  yes/no: No  Have you or a family member traveled in the last 14 days and where? yes/no: No   If the patient indicates "YES" to the above questions, their PAT will be rescheduled to limit the exposure to others and, the surgeon will be notified. THE PATIENT WILL NEED TO BE ASYMPTOMATIC FOR 14 DAYS.   If the patient is not experiencing any of these symptoms, the PAT nurse will instruct them to NOT bring anyone with them to their appointment since they may have these symptoms or traveled as well.   Please remind your patients and families that hospital visitation restrictions are in effect and the importance of the restrictions.

## 2021-11-22 NOTE — Progress Notes (Signed)
This is not my patient.

## 2021-11-23 ENCOUNTER — Ambulatory Visit (HOSPITAL_COMMUNITY): Payer: PPO | Admitting: Certified Registered"

## 2021-11-23 ENCOUNTER — Encounter (HOSPITAL_COMMUNITY)
Admission: RE | Disposition: A | Discharge status: Home / Self Care | Payer: Self-pay | Source: Home / Self Care | Attending: Interventional Radiology

## 2021-11-23 ENCOUNTER — Observation Stay (HOSPITAL_COMMUNITY)
Admission: RE | Admit: 2021-11-23 | Discharge: 2021-11-23 | Disposition: A | Discharge status: Home / Self Care | Payer: PPO | Source: Ambulatory Visit | Attending: Interventional Radiology | Admitting: Interventional Radiology

## 2021-11-23 ENCOUNTER — Inpatient Hospital Stay (HOSPITAL_COMMUNITY)
Admission: RE | Admit: 2021-11-23 | Discharge: 2021-11-24 | DRG: 027 | Disposition: A | Discharge status: Home / Self Care | Payer: PPO | Attending: Interventional Radiology | Admitting: Interventional Radiology

## 2021-11-23 ENCOUNTER — Encounter (HOSPITAL_COMMUNITY): Payer: Self-pay | Admitting: Interventional Radiology

## 2021-11-23 DIAGNOSIS — E785 Hyperlipidemia, unspecified: Secondary | ICD-10-CM | POA: Diagnosis present

## 2021-11-23 DIAGNOSIS — F32A Depression, unspecified: Secondary | ICD-10-CM | POA: Diagnosis not present

## 2021-11-23 DIAGNOSIS — Z9989 Dependence on other enabling machines and devices: Secondary | ICD-10-CM | POA: Diagnosis not present

## 2021-11-23 DIAGNOSIS — I671 Cerebral aneurysm, nonruptured: Principal | ICD-10-CM | POA: Diagnosis present

## 2021-11-23 DIAGNOSIS — Z79899 Other long term (current) drug therapy: Secondary | ICD-10-CM

## 2021-11-23 DIAGNOSIS — G4700 Insomnia, unspecified: Secondary | ICD-10-CM | POA: Diagnosis present

## 2021-11-23 DIAGNOSIS — K219 Gastro-esophageal reflux disease without esophagitis: Secondary | ICD-10-CM | POA: Diagnosis not present

## 2021-11-23 DIAGNOSIS — I720 Aneurysm of carotid artery: Secondary | ICD-10-CM | POA: Diagnosis not present

## 2021-11-23 DIAGNOSIS — G4733 Obstructive sleep apnea (adult) (pediatric): Secondary | ICD-10-CM | POA: Diagnosis not present

## 2021-11-23 DIAGNOSIS — Z791 Long term (current) use of non-steroidal anti-inflammatories (NSAID): Secondary | ICD-10-CM | POA: Diagnosis not present

## 2021-11-23 DIAGNOSIS — Z87891 Personal history of nicotine dependence: Secondary | ICD-10-CM

## 2021-11-23 DIAGNOSIS — Z7982 Long term (current) use of aspirin: Secondary | ICD-10-CM

## 2021-11-23 DIAGNOSIS — Z7902 Long term (current) use of antithrombotics/antiplatelets: Secondary | ICD-10-CM | POA: Diagnosis not present

## 2021-11-23 DIAGNOSIS — I725 Aneurysm of other precerebral arteries: Secondary | ICD-10-CM | POA: Diagnosis not present

## 2021-11-23 HISTORY — PX: IR US GUIDE VASC ACCESS RIGHT: IMG2390

## 2021-11-23 HISTORY — DX: Sleep apnea, unspecified: G47.30

## 2021-11-23 HISTORY — PX: IR ANGIO VERTEBRAL SEL VERTEBRAL UNI R MOD SED: IMG5368

## 2021-11-23 HISTORY — PX: RADIOLOGY WITH ANESTHESIA: SHX6223

## 2021-11-23 HISTORY — PX: IR CT HEAD LTD: IMG2386

## 2021-11-23 HISTORY — PX: IR TRANSCATH/EMBOLIZ: IMG695

## 2021-11-23 HISTORY — PX: IR 3D INDEPENDENT WKST: IMG2385

## 2021-11-23 LAB — CBC WITH DIFFERENTIAL/PLATELET
Abs Immature Granulocytes: 0.03 10*3/uL (ref 0.00–0.07)
Basophils Absolute: 0.1 10*3/uL (ref 0.0–0.1)
Basophils Relative: 1 %
Eosinophils Absolute: 0.2 10*3/uL (ref 0.0–0.5)
Eosinophils Relative: 3 %
HCT: 42.2 % (ref 36.0–46.0)
Hemoglobin: 13.8 g/dL (ref 12.0–15.0)
Immature Granulocytes: 0 %
Lymphocytes Relative: 33 %
Lymphs Abs: 2.5 10*3/uL (ref 0.7–4.0)
MCH: 30.5 pg (ref 26.0–34.0)
MCHC: 32.7 g/dL (ref 30.0–36.0)
MCV: 93.2 fL (ref 80.0–100.0)
Monocytes Absolute: 0.7 10*3/uL (ref 0.1–1.0)
Monocytes Relative: 9 %
Neutro Abs: 4.1 10*3/uL (ref 1.7–7.7)
Neutrophils Relative %: 54 %
Platelets: 234 10*3/uL (ref 150–400)
RBC: 4.53 MIL/uL (ref 3.87–5.11)
RDW: 13.5 % (ref 11.5–15.5)
WBC: 7.6 10*3/uL (ref 4.0–10.5)
nRBC: 0 % (ref 0.0–0.2)

## 2021-11-23 LAB — POCT ACTIVATED CLOTTING TIME
Activated Clotting Time: 203 seconds
Activated Clotting Time: 209 seconds
Activated Clotting Time: 215 seconds

## 2021-11-23 LAB — HEPARIN LEVEL (UNFRACTIONATED): Heparin Unfractionated: 0.19 IU/mL — ABNORMAL LOW (ref 0.30–0.70)

## 2021-11-23 LAB — PROTIME-INR
INR: 0.9 (ref 0.8–1.2)
Prothrombin Time: 12.3 seconds (ref 11.4–15.2)

## 2021-11-23 LAB — BASIC METABOLIC PANEL
Anion gap: 10 (ref 5–15)
BUN: 19 mg/dL (ref 8–23)
CO2: 22 mmol/L (ref 22–32)
Calcium: 9.6 mg/dL (ref 8.9–10.3)
Chloride: 105 mmol/L (ref 98–111)
Creatinine, Ser: 0.82 mg/dL (ref 0.44–1.00)
GFR, Estimated: >60 mL/min (ref >60)
Glucose, Bld: 92 mg/dL (ref 70–99)
Potassium: 4.2 mmol/L (ref 3.5–5.1)
Sodium: 137 mmol/L (ref 135–145)

## 2021-11-23 LAB — MRSA NEXT GEN BY PCR, NASAL: MRSA by PCR Next Gen: NOT DETECTED

## 2021-11-23 SURGERY — IR WITH ANESTHESIA
Anesthesia: General

## 2021-11-23 MED ORDER — CLEVIDIPINE BUTYRATE 0.5 MG/ML IV EMUL: 0.0000 mg/h | INTRAVENOUS | Status: DC

## 2021-11-23 MED ORDER — ONDANSETRON HCL 4 MG/2ML IJ SOLN
INTRAMUSCULAR | Status: DC | PRN
  Administered 2021-11-23: 4 mg via INTRAVENOUS

## 2021-11-23 MED ORDER — SUGAMMADEX SODIUM 200 MG/2ML IV SOLN
INTRAVENOUS | Status: DC | PRN
  Administered 2021-11-23: 160 mg via INTRAVENOUS

## 2021-11-23 MED ORDER — NITROGLYCERIN 1 MG/10 ML FOR IR/CATH LAB
INTRA_ARTERIAL | Status: AC | PRN
  Administered 2021-11-23: 25 ug via INTRA_ARTERIAL
  Administered 2021-11-23: 200 ug via INTRA_ARTERIAL
  Administered 2021-11-23 (×2): 25 ug via INTRA_ARTERIAL

## 2021-11-23 MED ORDER — SODIUM CHLORIDE 0.9 % IV SOLN: INTRAVENOUS | Status: DC

## 2021-11-23 MED ORDER — HEPARIN (PORCINE) 25000 UT/250ML-% IV SOLN
INTRAVENOUS | Status: AC
  Filled 2021-11-23: qty 250

## 2021-11-23 MED ORDER — CHLORHEXIDINE GLUCONATE CLOTH 2 % EX PADS
6.0000 | MEDICATED_PAD | Freq: Every day | CUTANEOUS | Status: DC
  Administered 2021-11-23: 6 via TOPICAL

## 2021-11-23 MED ORDER — HEPARIN SODIUM (PORCINE) 1000 UNIT/ML IJ SOLN
INTRAMUSCULAR | Status: DC | PRN
  Administered 2021-11-23: 1000 [IU] via INTRAVENOUS

## 2021-11-23 MED ORDER — DEXAMETHASONE SODIUM PHOSPHATE 10 MG/ML IJ SOLN
INTRAMUSCULAR | Status: DC | PRN
  Administered 2021-11-23: 5 mg via INTRAVENOUS

## 2021-11-23 MED ORDER — HEPARIN SODIUM (PORCINE) 1000 UNIT/ML IJ SOLN
INTRAMUSCULAR | Status: AC | PRN
  Administered 2021-11-23: 2000 [IU] via INTRAVENOUS

## 2021-11-23 MED ORDER — MIDAZOLAM HCL 2 MG/2ML IJ SOLN: 2.0000 mg | Freq: Once | INTRAMUSCULAR | Status: DC

## 2021-11-23 MED ORDER — LABETALOL HCL 5 MG/ML IV SOLN
INTRAVENOUS | Status: DC | PRN
  Administered 2021-11-23 (×2): 5 mg via INTRAVENOUS

## 2021-11-23 MED ORDER — HEPARIN (PORCINE) 25000 UT/250ML-% IV SOLN
500.0000 [IU]/h | INTRAVENOUS | Status: DC
  Administered 2021-11-23: 500 [IU]/h via INTRAVENOUS
  Filled 2021-11-23: qty 250

## 2021-11-23 MED ORDER — ROCURONIUM BROMIDE 10 MG/ML (PF) SYRINGE
PREFILLED_SYRINGE | INTRAVENOUS | Status: DC | PRN
  Administered 2021-11-23: 60 mg via INTRAVENOUS
  Administered 2021-11-23 (×3): 20 mg via INTRAVENOUS

## 2021-11-23 MED ORDER — EPHEDRINE SULFATE-NACL 50-0.9 MG/10ML-% IV SOSY
PREFILLED_SYRINGE | INTRAVENOUS | Status: DC | PRN
  Administered 2021-11-23 (×2): 5 mg via INTRAVENOUS

## 2021-11-23 MED ORDER — PROPOFOL 10 MG/ML IV BOLUS
INTRAVENOUS | Status: DC | PRN
  Administered 2021-11-23: 200 mg via INTRAVENOUS
  Administered 2021-11-23: 20 mg via INTRAVENOUS

## 2021-11-23 MED ORDER — CHLORHEXIDINE GLUCONATE 0.12 % MT SOLN
OROMUCOSAL | Status: AC
  Administered 2021-11-23: 15 mL
  Filled 2021-11-23: qty 15

## 2021-11-23 MED ORDER — CEFAZOLIN SODIUM-DEXTROSE 2-4 GM/100ML-% IV SOLN
2.0000 g | INTRAVENOUS | Status: AC
  Administered 2021-11-23: 2 g via INTRAVENOUS
  Filled 2021-11-23: qty 100

## 2021-11-23 MED ORDER — FENTANYL CITRATE (PF) 100 MCG/2ML IJ SOLN
INTRAMUSCULAR | Status: DC | PRN
  Administered 2021-11-23: 100 ug via INTRAVENOUS

## 2021-11-23 MED ORDER — TICAGRELOR 90 MG PO TABS: 90.0000 mg | ORAL_TABLET | ORAL | Status: DC

## 2021-11-23 MED ORDER — ACETAMINOPHEN 650 MG RE SUPP: 650.0000 mg | RECTAL | Status: DC | PRN

## 2021-11-23 MED ORDER — EPTIFIBATIDE 20 MG/10ML IV SOLN
INTRAVENOUS | Status: AC | PRN
  Administered 2021-11-23 (×3): 1.5 mg via INTRA_ARTERIAL

## 2021-11-23 MED ORDER — AMISULPRIDE (ANTIEMETIC) 5 MG/2ML IV SOLN: 10.0000 mg | Freq: Once | INTRAVENOUS | Status: DC | PRN

## 2021-11-23 MED ORDER — ASPIRIN 81 MG PO CHEW: 81.0000 mg | CHEWABLE_TABLET | Freq: Every day | ORAL | Status: DC

## 2021-11-23 MED ORDER — ASPIRIN 81 MG PO CHEW
81.0000 mg | CHEWABLE_TABLET | Freq: Every day | ORAL | Status: DC
  Administered 2021-11-24: 81 mg via ORAL
  Filled 2021-11-23: qty 1

## 2021-11-23 MED ORDER — SODIUM CHLORIDE (PF) 0.9 % IJ SOLN
INTRAVENOUS | Status: AC | PRN
  Administered 2021-11-23: 200 ug via INTRA_ARTERIAL

## 2021-11-23 MED ORDER — CLEVIDIPINE BUTYRATE 0.5 MG/ML IV EMUL
INTRAVENOUS | Status: AC
  Filled 2021-11-23: qty 50

## 2021-11-23 MED ORDER — NIMODIPINE 30 MG PO CAPS: 0.0000 mg | ORAL_CAPSULE | ORAL | Status: DC

## 2021-11-23 MED ORDER — PHENYLEPHRINE HCL-NACL 20-0.9 MG/250ML-% IV SOLN
INTRAVENOUS | Status: DC | PRN
  Administered 2021-11-23: 25 ug/min via INTRAVENOUS

## 2021-11-23 MED ORDER — EPTIFIBATIDE 20 MG/10ML IV SOLN
INTRAVENOUS | Status: AC
  Filled 2021-11-23: qty 10

## 2021-11-23 MED ORDER — FENTANYL CITRATE (PF) 100 MCG/2ML IJ SOLN: 25.0000 ug | INTRAMUSCULAR | Status: DC | PRN

## 2021-11-23 MED ORDER — HEPARIN SODIUM (PORCINE) 1000 UNIT/ML IJ SOLN
INTRAMUSCULAR | Status: AC
  Filled 2021-11-23: qty 10

## 2021-11-23 MED ORDER — IOHEXOL 300 MG/ML  SOLN
100.0000 mL | Freq: Once | INTRAMUSCULAR | Status: AC | PRN
  Administered 2021-11-23: 108 mL via INTRAVENOUS

## 2021-11-23 MED ORDER — ACETAMINOPHEN 10 MG/ML IV SOLN: 1000.0000 mg | Freq: Once | INTRAVENOUS | Status: DC | PRN

## 2021-11-23 MED ORDER — ACETAMINOPHEN 160 MG/5ML PO SOLN: 650.0000 mg | ORAL | Status: DC | PRN

## 2021-11-23 MED ORDER — VERAPAMIL HCL 2.5 MG/ML IV SOLN
INTRAVENOUS | Status: AC
  Filled 2021-11-23: qty 2

## 2021-11-23 MED ORDER — NITROGLYCERIN 1 MG/10 ML FOR IR/CATH LAB
INTRA_ARTERIAL | Status: AC
  Filled 2021-11-23: qty 10

## 2021-11-23 MED ORDER — TICAGRELOR 90 MG PO TABS: 90.0000 mg | ORAL_TABLET | Freq: Two times a day (BID) | ORAL | Status: DC

## 2021-11-23 MED ORDER — TICAGRELOR 90 MG PO TABS
90.0000 mg | ORAL_TABLET | Freq: Two times a day (BID) | ORAL | Status: DC
  Administered 2021-11-23 – 2021-11-24 (×2): 90 mg via ORAL
  Filled 2021-11-23 (×2): qty 1

## 2021-11-23 MED ORDER — LIDOCAINE 2% (20 MG/ML) 5 ML SYRINGE
INTRAMUSCULAR | Status: DC | PRN
  Administered 2021-11-23: 60 mg via INTRAVENOUS

## 2021-11-23 MED ORDER — ACETAMINOPHEN 325 MG PO TABS
650.0000 mg | ORAL_TABLET | ORAL | Status: DC | PRN
  Administered 2021-11-23 (×2): 650 mg via ORAL
  Filled 2021-11-23 (×2): qty 2

## 2021-11-23 MED ORDER — VERAPAMIL HCL 2.5 MG/ML IV SOLN
INTRAVENOUS | Status: AC | PRN
  Administered 2021-11-23: 2.5 mg via INTRA_ARTERIAL

## 2021-11-23 MED ORDER — HEPARIN (PORCINE) 25000 UT/250ML-% IV SOLN
500.0000 [IU]/h | INTRAVENOUS | Status: AC
  Filled 2021-11-23: qty 250

## 2021-11-23 MED ORDER — PHENYLEPHRINE 40 MCG/ML (10ML) SYRINGE FOR IV PUSH (FOR BLOOD PRESSURE SUPPORT)
PREFILLED_SYRINGE | INTRAVENOUS | Status: DC | PRN
  Administered 2021-11-23 (×6): 40 ug via INTRAVENOUS

## 2021-11-23 MED ORDER — CLEVIDIPINE BUTYRATE 0.5 MG/ML IV EMUL
INTRAVENOUS | Status: DC | PRN
  Administered 2021-11-23: 2 mg/h via INTRAVENOUS

## 2021-11-23 MED ORDER — ONDANSETRON HCL 4 MG/2ML IJ SOLN: 4.0000 mg | Freq: Once | INTRAMUSCULAR | Status: DC | PRN

## 2021-11-23 MED ORDER — LIDOCAINE HCL 1 % IJ SOLN
INTRAMUSCULAR | Status: AC
  Filled 2021-11-23: qty 20

## 2021-11-23 MED ORDER — MIDAZOLAM HCL 2 MG/2ML IJ SOLN
INTRAMUSCULAR | Status: DC | PRN
  Administered 2021-11-23: 1 mg via INTRAVENOUS

## 2021-11-23 NOTE — Progress Notes (Signed)
Brief Progress Note:  Pt sitting in bed eating lunch accompanied by partner at bedside.  In a pleasant mood.  Reports mild headache that has improved with Tylenol.  Neuro exam is unremarkable.  TR band in place and access site without apparent bleeding.   Patient's questions were answered and understands what to expect during her stay.  Will check in tomorrow for probable d/c.   Electronically Signed: Pasty Spillers, PA 11/23/2021, 3:27 PM

## 2021-11-23 NOTE — Anesthesia Procedure Notes (Signed)
Procedure Name: Intubation Date/Time: 11/23/2021 9:01 AM Performed by: Gaylene Brooks, CRNA Pre-anesthesia Checklist: Patient identified, Emergency Drugs available, Suction available and Patient being monitored Patient Re-evaluated:Patient Re-evaluated prior to induction Oxygen Delivery Method: Circle System Utilized Preoxygenation: Pre-oxygenation with 100% oxygen Induction Type: IV induction Ventilation: Mask ventilation without difficulty Laryngoscope Size: Miller and 2 Grade View: Grade II Tube type: Oral Tube size: 7.0 mm Number of attempts: 1 Airway Equipment and Method: Stylet and Oral airway Placement Confirmation: ETT inserted through vocal cords under direct vision, positive ETCO2 and breath sounds checked- equal and bilateral Secured at: 22 cm Tube secured with: Tape Dental Injury: Teeth and Oropharynx as per pre-operative assessment

## 2021-11-23 NOTE — H&P (Signed)
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  Supervising Physician: Luanne Bras  Patient Status: [facility] - Out-pt  History of Present Illness: Katrina Woods is a 65 y.o. 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 Clinically, the patient remains unchanged with only an occasional Headache. Denies debilitating headaches, nausea, vomiting, photophobia, headaches waking her up in the middle of the night, visual symptoms, speech comprehension, swallowing, or gait or balance difficulties. Denies any symptoms of seizures, or of episodes of loss of awareness. Most recent surveillance MRA reveals increased prominence of the neck remnant, and/or recanalization of the neck of the previously treated basilar apex aneurysm.  Pt presents today for diagnostic arteriogram with possible intervention/reembolization of the aneurysm.  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   Back pain    Bursitis of left hip 07/2012   takes Meloxicam daily    Depression    takes Lexapro daily   GERD (gastroesophageal reflux disease)    takes Omeprazole daily   Headache    Hiatal hernia 01/2008   History of bronchitis 2015   History of colon polyps    benign   History of kidney stones    History of shingles    Hyperlipidemia    takes Atorvastatin daily   Insomnia    takes Xanax nightly   Sleep apnea    can not tolerate Cpap   Weakness    numbness and tingling-thinks its carpal tunnel    Past Surgical History:  Procedure Laterality Date   ABDOMINAL HYSTERECTOMY  2004   total   CEREBRAL ANEURYSM REPAIR  2006   Deveshwar, coil    CESAREAN SECTION  11/25/1982   COLONOSCOPY     DILATION AND CURETTAGE OF UTERUS     ESOPHAGOGASTRODUODENOSCOPY     IR GENERIC HISTORICAL  10/23/2016   IR RADIOLOGIST EVAL & MGMT 10/23/2016 [facility]-INTERV RAD    IR RADIOLOGIST EVAL & MGMT  09/18/2021   KNEE CARTILAGE SURGERY Left 1987   POLYPECTOMY     RADIOLOGY WITH ANESTHESIA N/A 05/11/2015   Procedure: ANEURYSM EMBOLIZATION;  Surgeon: Luanne Bras, MD;  Location: [place];  Service: Radiology;  Laterality: N/A;   WISDOM TOOTH EXTRACTION  1986   all four extracted    Allergies: Etodolac, Pollen extract, and Statins  Medications: Prior to Admission medications   Medication Sig Start Date End Date Taking? Authorizing Provider  acetaminophen (TYLENOL) 325 MG tablet Take 650 mg by mouth every 6 (six) hours as needed for moderate pain or headache.    [provider]  ALPRAZolam Duanne Moron) 0.5 MG tablet TAKE 1 TABLET BY MOUTH AT BEDTIME AS NEEDED FOR ANXIETY OR SLEEP 10/17/21   Crecencio Mc, MD  aspirin EC 81 MG tablet Take 81 mg by mouth daily. Swallow whole.    [provider]  celecoxib (CELEBREX) 200 MG capsule TAKE 1 CAPSULE BY MOUTH TWICE A DAY Patient taking differently: Take 200 mg by mouth daily. 10/30/21   Crecencio Mc, MD  DULoxetine (CYMBALTA) 20 MG capsule Take 1 capsule (20 mg total) by mouth daily. Patient taking differently: Take 40 mg by mouth daily. 10/17/21   Crecencio Mc, MD  escitalopram (LEXAPRO) 10 MG tablet TAKE 1 TABLET BY MOUTH EVERY DAY Patient not taking: Reported on 11/21/2021 09/08/20   Crecencio Mc, MD  ezetimibe (ZETIA) 10 MG  tablet Take 1 tablet (10 mg total) by mouth daily. 10/17/21   Crecencio Mc, MD  pantoprazole (PROTONIX) 40 MG tablet TAKE 1 TABLET BY MOUTH EVERY DAY 09/18/21   Crecencio Mc, MD  ticagrelor (BRILINTA) 90 MG TABS tablet Take 1 tablet (90 mg total) by mouth 2 (two) times daily. 11/16/21   Joaquim Nam, PA-C     Family History  Problem Relation Age of Onset   Diabetes Mother        type 2   Hypertension Mother    Heart disease Mother        CHF   Cancer Mother        lung- previous smoker- had quit 47 yrs before   Appendicitis Father        acute    Cancer Father        prostate?   Diabetes Brother 12       type 2   Hypertension Brother    Cancer Maternal Grandmother        breast, uterine   Hypertension Maternal Grandmother    Heart disease Maternal Grandmother        CHF   Diabetes Maternal Grandmother    Stroke Maternal Grandfather    Heart block Paternal Grandfather        massive   Colon cancer Neg Hx    Stomach cancer Neg Hx    Colon polyps Neg Hx    Esophageal cancer Neg Hx    Rectal cancer Neg Hx     Social History   Socioeconomic History   Marital status: Single    Spouse name: Not on file   Number of children: Not on file   Years of education: Not on file   Highest education level: Not on file  Occupational History   Not on file  Tobacco Use   Smoking status: Former    Types: Cigarettes   Smokeless tobacco: Never   Tobacco comments:    quit smoking in Nov 2015  Vaping Use   Vaping Use: Some days  Substance and Sexual Activity   Alcohol use: Yes    Alcohol/week: 3.0 standard drinks    Types: 3 Glasses of wine per week    Comment: occasional wine   Drug use: No   Sexual activity: Not Currently  Other Topics Concern   Not on file  Social History Narrative   Not on file   Social Determinants of Health   Financial Resource Strain: Not on file  Food Insecurity: Not on file  Transportation Needs: Not on file  Physical Activity: Not on file  Stress: Not on file  Social Connections: Not on file     Review of Systems: A 12 point ROS discussed and pertinent positives are indicated in the HPI above.  All other systems are negative.  Vital Signs: There were no vitals taken for this visit.  Physical Exam Constitutional:      Appearance: Normal appearance.  HENT:     Head: Normocephalic.     Nose: Nose normal.     Mouth/Throat:     Mouth: Mucous membranes are moist.     Pharynx: Oropharynx is clear.  Eyes:     Extraocular Movements: Extraocular movements intact.  Cardiovascular:     Rate  and Rhythm: Normal rate and regular rhythm.     Pulses: Normal pulses.     Heart sounds: Normal heart sounds.  Pulmonary:     Effort: Pulmonary effort is  normal.     Breath sounds: Normal breath sounds.  Abdominal:     General: Abdomen is flat.     Palpations: Abdomen is soft.  Skin:    General: Skin is warm and dry.     Capillary Refill: Capillary refill takes less than 2 seconds.  Neurological:     General: No focal deficit present.     Mental Status: She is alert and oriented to person, place, and time. Mental status is at baseline.     Cranial Nerves: No cranial nerve deficit.     Sensory: Sensation is intact.     Motor: Motor function is intact.     Coordination: Coordination is intact.    Imaging: No results found.  Labs:  CBC: Recent Labs    10/20/21 0733 11/23/21 0636  WBC 6.2 7.6  HGB 14.8 13.8  HCT 44.0 42.2  PLT 176.0 234    COAGS: Recent Labs    11/23/21 0636  INR 0.9    BMP: Recent Labs    10/20/21 0733 11/23/21 0636  NA 137 137  K 4.8 4.2  CL 102 105  CO2 27 22  GLUCOSE 87 92  BUN 15 19  CALCIUM 9.9 9.6  CREATININE 0.88 0.82  GFRNONAA  --  >60    LIVER FUNCTION TESTS: Recent Labs    10/20/21 0733  BILITOT 0.5  AST 13  ALT 5  ALKPHOS 53  PROT 6.8  ALBUMIN 4.2    Assessment and Plan:  Basilar apex aneurysm --for diagnostic arteriogram with possible intervention today --Pt is NPO, agreeable, and prepared to stay following procedure --Lyn Henri, partner, is her contact during this admission and can be reached at [phone].  The Risks and benefits of arteriogram and embolization were discussed with the patient including, but not limited to bleeding, infection, vascular injury, post operative pain, neurologic changes, or contrast induced renal failure.  This procedure involves the use of X-rays and because of the nature of the planned procedure, it is possible that we will have prolonged use of X-ray fluoroscopy.  Potential  radiation risks to you include (but are not limited to) the following: - A slightly elevated risk for cancer several years later in life. This risk is typically less than 0.5% percent. This risk is low in comparison to the normal incidence of human cancer, which is 33% for women and 50% for men according to the [place]. - Radiation induced injury can include skin redness, resembling a rash, tissue breakdown / ulcers and hair loss (which can be temporary or permanent).   The likelihood of either of these occurring depends on the difficulty of the procedure and whether you are sensitive to radiation due to previous procedures, disease, or genetic conditions.   IF your procedure requires a prolonged use of radiation, you will be notified and given written instructions for further action.  It is your responsibility to monitor the irradiated area for the 2 weeks following the procedure and to notify your physician if you are concerned that you have suffered a radiation induced injury.    All of the patient's questions were answered, patient is agreeable to proceed. Consent signed and in chart.     Thank you for this interesting consult.  I greatly enjoyed meeting Jersi Mcmaster and look forward to participating in their care.  A copy of this report was sent to the requesting provider on this date.  Electronically Signed: Pasty Spillers, PA 11/23/2021, 8:24 AM  I spent a total of  25 Minutes in face to face in clinical consultation, greater than 50% of which was counseling/coordinating care for basilar apex aneurysm.

## 2021-11-23 NOTE — Transfer of Care (Signed)
Immediate Anesthesia Transfer of Care Note  Patient: Katrina Woods  Procedure(s) Performed: IR WITH ANESTHESIA EMBOLIZATION  Patient Location: PACU  Anesthesia Type:General  Level of Consciousness: awake, alert  and oriented  Airway & Oxygen Therapy: Patient Spontanous Breathing and Patient connected to nasal cannula oxygen  Post-op Assessment: Report given to RN, Post -op Vital signs reviewed and stable and Patient moving all extremities X 4  Post vital signs: Reviewed and stable  Last Vitals:  Vitals Value Taken Time  BP 103/70 11/23/21 1232  Temp    Pulse 69 11/23/21 1238  Resp 12 11/23/21 1238  SpO2 98 % 11/23/21 1238  Vitals shown include unvalidated device data.  Last Pain:  Vitals:   11/23/21 0709  TempSrc:   PainSc: 3       Patients Stated Pain Goal: 2 (78/71/83 6725)  Complications: No notable events documented.

## 2021-11-23 NOTE — Progress Notes (Signed)
ANTICOAGULATION CONSULT NOTE - Initial Consult  Pharmacy Consult for Heparin Indication: Post IR  Allergies  Allergen Reactions   Etodolac     Unknown reaction   Pollen Extract     Other reaction(s): Unknown   Statins     Muscle aches    Patient Measurements: Height: 5\' 2"  (157.5 cm) Weight: 60.8 kg (134 lb) IBW/kg (Calculated) : 50.1 Heparin Dosing Weight: 60 kg   Vital Signs: Temp: 97.7 F (36.5 C) (12/15 1232) Temp Source: Oral (12/15 0618) BP: 100/64 (12/15 1302) Pulse Rate: 68 (12/15 1302)  Labs: Recent Labs    11/23/21 0636  HGB 13.8  HCT 42.2  PLT 234  LABPROT 12.3  INR 0.9  CREATININE 0.82    Estimated Creatinine Clearance: 58 mL/min (by C-G formula based on SCr of 0.82 mg/dL).   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   Back pain    Bursitis of left hip 07/2012   takes Meloxicam daily    Depression    takes Lexapro daily   GERD (gastroesophageal reflux disease)    takes Omeprazole daily   Headache    Hiatal hernia 01/2008   History of bronchitis 2015   History of colon polyps    benign   History of kidney stones    History of shingles    Hyperlipidemia    takes Atorvastatin daily   Insomnia    takes Xanax nightly   Sleep apnea    can not tolerate Cpap   Weakness    numbness and tingling-thinks its carpal tunnel    Assessment: 65 yo female with basilar apex aneursym s/p intervention and pharmacy consulted to dose heparin for postIR  Goal of Therapy:  Heparin level 0.1 - 0.25 units/ml  Monitor platelets by anticoagulation protocol: Yes   Plan:  Start heparin 500 units/hr  Check 6hr heparin level  Heparin off at 0800 on 12/16 per protocol  Cristela Felt, PharmD, BCPS Clinical Pharmacist 11/23/2021 1:12 PM

## 2021-11-23 NOTE — Anesthesia Procedure Notes (Signed)
Arterial Line Insertion Start/End12/15/2022 8:10 AM, 11/23/2021 8:20 AM Performed by: Gaylene Brooks, CRNA, CRNA  Preanesthetic checklist: patient identified, IV checked, site marked, risks and benefits discussed, surgical consent, monitors and equipment checked, pre-op evaluation, timeout performed and anesthesia consent Lidocaine 1% used for infiltration Left, radial was placed Catheter size: 20 G  Attempts: 1 Procedure performed without using ultrasound guided technique. Following insertion, Biopatch and dressing applied. Post procedure assessment: normal  Patient tolerated the procedure well with no immediate complications.

## 2021-11-23 NOTE — Procedures (Signed)
S/P RT Vertbral arteriogram. RT Rad approach. Findings. 1.Approximately 2.8 mm x 2.2 mm new aneurysm at the basilar artery apex. S/P endovascular treatment with a 3.5 mm x 23 ,mm LVIS JR braided stent. 3.Previously treated basilar artery apex aneurysm obliterated with no recanalization. Post CT No ICH . Extubated.  Denies any H/As,N/V ,visual speech changes. Pupils 2 to 3 mm Rt = LT . No facial asymmetry. Tongue midline.  Moves all 4s equally .  RT rad pulse intact distally. Katrina Jagielski MD

## 2021-11-23 NOTE — Progress Notes (Signed)
ANTICOAGULATION CONSULT NOTE  Pharmacy Consult for Heparin Indication: Post IR  Allergies  Allergen Reactions   Etodolac     Unknown reaction   Pollen Extract     Other reaction(s): Unknown   Statins     Muscle aches    Patient Measurements: Height: 5\' 2"  (157.5 cm) Weight: 61 kg (134 lb 7.7 oz) IBW/kg (Calculated) : 50.1 Heparin Dosing Weight: 60 kg   Vital Signs: Temp: 97.9 F (36.6 C) (12/15 2000) Temp Source: Oral (12/15 2000) BP: 112/68 (12/15 2000) Pulse Rate: 63 (12/15 2000)  Labs: Recent Labs    11/23/21 0636 11/23/21 1931  HGB 13.8  --   HCT 42.2  --   PLT 234  --   LABPROT 12.3  --   INR 0.9  --   HEPARINUNFRC  --  0.19*  CREATININE 0.82  --      Estimated Creatinine Clearance: 58.1 mL/min (by C-G formula based on SCr of 0.82 mg/dL).   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   Back pain    Bursitis of left hip 07/2012   takes Meloxicam daily    Depression    takes Lexapro daily   GERD (gastroesophageal reflux disease)    takes Omeprazole daily   Headache    Hiatal hernia 01/2008   History of bronchitis 2015   History of colon polyps    benign   History of kidney stones    History of shingles    Hyperlipidemia    takes Atorvastatin daily   Insomnia    takes Xanax nightly   Sleep apnea    can not tolerate Cpap   Weakness    numbness and tingling-thinks its carpal tunnel    Assessment: 65 yo female with basilar apex aneursym s/p intervention and pharmacy consulted to dose heparin for postIR.  Heparin level therapeutic at 0.19. CBC wnl. No bleed issues reported.  Goal of Therapy:  Heparin level 0.1 - 0.25 units/ml  Monitor platelets by anticoagulation protocol: Yes   Plan:  Continue heparin at 500 units/hr Will not recheck heparin level, as drip will d/c at 0800 on 12/16 per protocol Monitor daily CBC, s/sx bleeding   Arturo Morton, PharmD, BCPS Please check  AMION for all [place] contact numbers Clinical Pharmacist 11/23/2021 8:35 PM

## 2021-11-23 NOTE — Sedation Documentation (Addendum)
Pt transported to PACU bay 3 via stretcher accompanied by RN and CRNA. McKenzie at bedside to receive report. Handoff completed. Right wrist remains level 0 with TR band in place. Pt awake, alert and oriented. No s/s of distress at this time.

## 2021-11-23 NOTE — Anesthesia Postprocedure Evaluation (Signed)
Anesthesia Post Note  Patient: Shonnie Poudrier  Procedure(s) Performed: IR WITH ANESTHESIA EMBOLIZATION     Patient location during evaluation: PACU Anesthesia Type: General Level of consciousness: awake and alert Pain management: pain level controlled Vital Signs Assessment: post-procedure vital signs reviewed and stable Respiratory status: spontaneous breathing, nonlabored ventilation, respiratory function stable and patient connected to nasal cannula oxygen Cardiovascular status: blood pressure returned to baseline and stable Postop Assessment: no apparent nausea or vomiting Anesthetic complications: no   No notable events documented.  Last Vitals:  Vitals:   11/23/21 1302 11/23/21 1317  BP: 100/64 98/66  Pulse: 68 70  Resp: 14 11  Temp:  36.8 C  SpO2: 100% 97%    Last Pain:  Vitals:   11/23/21 1317  TempSrc:   PainSc: 0-No pain                 Effie Berkshire

## 2021-11-24 ENCOUNTER — Other Ambulatory Visit: Payer: Self-pay | Admitting: Radiology

## 2021-11-24 ENCOUNTER — Encounter (HOSPITAL_COMMUNITY): Payer: Self-pay | Admitting: Interventional Radiology

## 2021-11-24 DIAGNOSIS — I671 Cerebral aneurysm, nonruptured: Secondary | ICD-10-CM

## 2021-11-24 LAB — CBC WITH DIFFERENTIAL/PLATELET
Abs Immature Granulocytes: 0.06 10*3/uL (ref 0.00–0.07)
Basophils Absolute: 0 10*3/uL (ref 0.0–0.1)
Basophils Relative: 0 %
Eosinophils Absolute: 0 10*3/uL (ref 0.0–0.5)
Eosinophils Relative: 0 %
HCT: 34.4 % — ABNORMAL LOW (ref 36.0–46.0)
Hemoglobin: 11.3 g/dL — ABNORMAL LOW (ref 12.0–15.0)
Immature Granulocytes: 1 %
Lymphocytes Relative: 23 %
Lymphs Abs: 2.5 10*3/uL (ref 0.7–4.0)
MCH: 30.5 pg (ref 26.0–34.0)
MCHC: 32.8 g/dL (ref 30.0–36.0)
MCV: 92.7 fL (ref 80.0–100.0)
Monocytes Absolute: 0.8 10*3/uL (ref 0.1–1.0)
Monocytes Relative: 7 %
Neutro Abs: 7.7 10*3/uL (ref 1.7–7.7)
Neutrophils Relative %: 69 %
Platelets: UNDETERMINED 10*3/uL (ref 150–400)
RBC: 3.71 MIL/uL — ABNORMAL LOW (ref 3.87–5.11)
RDW: 13.7 % (ref 11.5–15.5)
WBC: 11.2 10*3/uL — ABNORMAL HIGH (ref 4.0–10.5)
nRBC: 0 % (ref 0.0–0.2)

## 2021-11-24 LAB — BASIC METABOLIC PANEL
Anion gap: 6 (ref 5–15)
BUN: 17 mg/dL (ref 8–23)
CO2: 21 mmol/L — ABNORMAL LOW (ref 22–32)
Calcium: 8.9 mg/dL (ref 8.9–10.3)
Chloride: 109 mmol/L (ref 98–111)
Creatinine, Ser: 0.95 mg/dL (ref 0.44–1.00)
GFR, Estimated: >60 mL/min (ref >60)
Glucose, Bld: 97 mg/dL (ref 70–99)
Potassium: 4.3 mmol/L (ref 3.5–5.1)
Sodium: 136 mmol/L (ref 135–145)

## 2021-11-24 MED ORDER — PHENYLEPHRINE HCL-NACL 20-0.9 MG/250ML-% IV SOLN
INTRAVENOUS | Status: AC
  Filled 2021-11-24: qty 500

## 2021-11-24 MED ORDER — PROPOFOL 1000 MG/100ML IV EMUL
INTRAVENOUS | Status: AC
  Filled 2021-11-24: qty 200

## 2021-11-24 NOTE — Discharge Instructions (Signed)
Activity Rest today, increase activity slowly. Continue taking 90 mg Brilinta daily. Continue taking Aspirin 81 mg once daily. No bending, stooping, or lifting more than 10 pounds for 2 weeks. No heavy coughing while turning head for 2 weeks.  No driving self for 2 weeks. Stay hydrated by drinking plenty of water.

## 2021-11-24 NOTE — Progress Notes (Signed)
°  Transition of Care [facility]) Screening Note   Patient Details  Name: Katrina Woods Date of Birth: 02/25/1956   Transition of Care [facility]) CM/SW Contact:    Benard Halsted, LCSW Phone Number: 11/24/2021, 9:07 AM    Transition of Care Department [facility]) has reviewed patient and no TOC needs have been identified at this time. We will continue to monitor patient advancement through interdisciplinary progression rounds. If new patient transition needs arise, please place a TOC consult.

## 2021-11-24 NOTE — Discharge Summary (Signed)
° °  Patient ID: Katrina Woods MRN: [id number] DOB/AGE: 08/06/1956 65 y.o.  Admit date: 11/23/2021 Discharge date: 11/24/2021  Supervising Physician: Luanne Bras  Patient Status: [facility] - In-pt  Admission Diagnoses: brain aneurysm  Discharge Diagnoses:  Active Problems:   Brain aneurysm   Discharged Condition: stable  Hospital Course: 65 y.o. female inpatient History of basilar apex aneurysm embolization on 10.5.6. pretreatment on 6.1.16. Found to have ongoing headaches, nausea vomiting, photophobia, headaches, visual symptoms, speech comprehends issues, and swallowing and gait difficulties.  MRA from 9,30.22 reads prominence of the neck remnant, and/or recanalization of the neck of the previously treated basilar apex aneurysm. Most recent study demonstrates approximately 3.5 x 3 mm neck remnant. compared to approximately 3 mm x 2 mm. 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 S/p right radial approach vertebral arteriorgram on 12.15.22 with Dr. Cherlyn Labella with  endovascular treatment with a 3.5 mm x 23 ,mm LVIS JR braided stent on 12.15.22 with Dr. Estanislado Pandy. Vertebral anrteriogram  also shows  Previously treated basilar artery apex aneurysm obliterated with no recanalization.  Consults: None  Significant Diagnostic Studies: No results found.  Treatments: IV hydration, analgesia: acetaminophen, anticoagulation: ASA and Brilinta, and procedures: Right radial approach vertebral arteriorgram on 12.15.22 with Dr. Cherlyn Labella with  endovascular treatment with a 3.5 mm x 23 ,mm LVIS JR braided stent on 12.15.22 with Dr. Estanislado Pandy  Discharge Exam: Blood pressure 115/87, pulse 60, temperature 97.9 F (36.6 C), temperature source Oral, resp. rate 11, height 5\' 2"  (1.575 m), weight 134 lb 7.7 oz (61 kg), SpO2 97 %. Physical Exam Vitals and nursing note reviewed.  Constitutional:      Appearance: She is well-developed.  HENT:      Head: Normocephalic and atraumatic.  Eyes:     Conjunctiva/sclera: Conjunctivae normal.  Cardiovascular:     Rate and Rhythm: Normal rate and regular rhythm.  Pulmonary:     Effort: Pulmonary effort is normal.  Musculoskeletal:        General: Normal range of motion.     Cervical back: Normal range of motion.  Skin:    General: Skin is warm.  Neurological:     Mental Status: She is alert and oriented to person, place, and time.     Comments: Alert, aware and oriented X 3 Speech and comprehension is intact.  PERRL bilaterally No facial droop noted Tongue midline Can spontaneously move all 4 extremities. Hand grip strength equal bilaterally. Speech, cognition and language  are generally intact.  Comprehension and fluency are normal.  Judgment and insight normal Negative pronator drift. Fine motor and coordination grossly in tact Gait not assessed Romberg not assessed Heel to toe not assessed Distal pulses not assessed  Right radial access site is soft with no active bleeding and no appreciable pseudoaneurysm. Dressing is C/D/I      Disposition: Discharge disposition: 01-Home or Self Care       Discharge Instructions     Discharge patient   Complete by: As directed    Discharge disposition: 01-Home or Self Care   Discharge patient date: 11/24/2021          Electronically Signed: Jacqualine Mau, NP 11/24/2021, 10:18 AM   I have spent Greater Than 30 Minutes discharging [name].

## 2021-11-24 NOTE — Final Progress Note (Signed)
Discharge AVS reviewed with patient and visitor, pt. Voices no further questions. Pt. Is satisfied that all belongings have been returned prior to discharge. Pt. Voided successfully post Foley catheter removal. Pt. Discharged without incident.

## 2021-11-29 ENCOUNTER — Telehealth (HOSPITAL_COMMUNITY): Payer: Self-pay

## 2021-11-29 NOTE — Telephone Encounter (Signed)
Called to schedule 2 wk f/u, no answer, left vm. AW 

## 2021-12-14 ENCOUNTER — Other Ambulatory Visit (HOSPITAL_COMMUNITY): Payer: Self-pay

## 2021-12-14 ENCOUNTER — Other Ambulatory Visit: Payer: Self-pay

## 2021-12-14 ENCOUNTER — Ambulatory Visit (HOSPITAL_COMMUNITY)
Admission: RE | Admit: 2021-12-14 | Discharge: 2021-12-14 | Disposition: A | Discharge status: Home / Self Care | Payer: PPO | Source: Ambulatory Visit | Attending: Radiology | Admitting: Radiology

## 2021-12-14 DIAGNOSIS — I671 Cerebral aneurysm, nonruptured: Secondary | ICD-10-CM

## 2021-12-14 DIAGNOSIS — I604 Nontraumatic subarachnoid hemorrhage from basilar artery: Secondary | ICD-10-CM | POA: Diagnosis not present

## 2021-12-18 HISTORY — PX: IR RADIOLOGIST EVAL & MGMT: IMG5224

## 2022-01-01 ENCOUNTER — Telehealth: Payer: PPO | Admitting: Physician Assistant

## 2022-01-01 DIAGNOSIS — R3989 Other symptoms and signs involving the genitourinary system: Secondary | ICD-10-CM | POA: Diagnosis not present

## 2022-01-01 MED ORDER — SULFAMETHOXAZOLE-TRIMETHOPRIM 800-160 MG PO TABS: 1.0000 | ORAL_TABLET | Freq: Two times a day (BID) | ORAL | 0 refills | Status: DC

## 2022-01-01 NOTE — Progress Notes (Signed)
Virtual Visit Consent   Katrina Woods, you are scheduled for a virtual visit with a [facility] provider today.     Just as with appointments in the office, your consent must be obtained to participate.  Your consent will be active for this visit and any virtual visit you may have with one of our providers in the next 365 days.     If you have a MyChart account, a copy of this consent can be sent to you electronically.  All virtual visits are billed to your insurance company just like a traditional visit in the office.    As this is a virtual visit, video technology does not allow for your provider to perform a traditional examination.  This may limit your provider's ability to fully assess your condition.  If your provider identifies any concerns that need to be evaluated in person or the need to arrange testing (such as labs, EKG, etc.), we will make arrangements to do so.     Although advances in technology are sophisticated, we cannot ensure that it will always work on either your end or our end.  If the connection with a video visit is poor, the visit may have to be switched to a telephone visit.  With either a video or telephone visit, we are not always able to ensure that we have a secure connection.     I need to obtain your verbal consent now.   Are you willing to proceed with your visit today?    Season Astacio has provided verbal consent on 01/01/2022 for a virtual visit (video or telephone).   Mar Daring, PA-C   Date: 01/01/2022 11:23 AM   Virtual Visit via Video Note   I, Mar Daring, connected with  Katrina Woods  ([id number], 1956-02-12) on 01/01/22 at 11:15 AM EST by a video-enabled telemedicine application and verified that I am speaking with the correct person using two identifiers.  Location: Patient: Virtual Visit Location Patient: Home Provider: Virtual Visit Location Provider: Home Office   I discussed the limitations of  evaluation and management by telemedicine and the availability of in person appointments. The patient expressed understanding and agreed to proceed.    History of Present Illness: Katrina Woods is a 66 y.o. who identifies as a female who was assigned female at birth, and is being seen today for possible UTI.  HPI: Urinary Tract Infection  This is a new problem. The current episode started in the past 7 days (Saturday morning early). The problem occurs every urination. The problem has been gradually worsening. The quality of the pain is described as aching, stabbing and burning. The pain is mild. There has been no fever. Associated symptoms include flank pain (right sided; dull ache), frequency, hematuria, hesitancy and urgency. Pertinent negatives include no chills, nausea, sweats or vomiting. She has tried increased fluids and acetaminophen (heat) for the symptoms. The treatment provided no relief. Her past medical history is significant for catheterization and recurrent UTIs. 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   Encounter for Papanicolaou smear for cervical cancer screening 10/31/2021   Relationship dysfunction 10/30/2021   Shortened PR interval 03/14/2021   Neck pain, musculoskeletal 03/12/2021   Syncope and collapse 03/12/2021   Aortic atherosclerosis ([place]) 08/22/2020   Personal history of COVID-19 06/19/2020   Polyarthritis 10/26/2019  Orthostasis 10/26/2019   Weight loss 10/26/2019   OSA on CPAP 10/22/2018   Sciatica of right side 10/15/2017   Epidermal cyst 04/18/2017   Shingles 03/02/2016   Myalgia due to statin 10/11/2013   Fatty infiltration of liver 10/11/2013   Encounter for preventive health examination 04/17/2013   Tubular adenoma of colon 12/03/2012   GERD (gastroesophageal reflux disease) 10/16/2012   Tobacco abuse 09/12/2012   Depression  with anxiety 09/12/2012   Back pain, thoracic 09/12/2012   Hiatal hernia    Hyperlipidemia LDL goal <100    Aneurysm, cerebral, nonruptured     Allergies:  Allergies  Allergen Reactions   Etodolac     Unknown reaction   Pollen Extract     Other reaction(s): Unknown   Statins     Muscle aches   Medications:  Current Outpatient Medications:    sulfamethoxazole-trimethoprim (BACTRIM DS) 800-160 MG tablet, Take 1 tablet by mouth 2 (two) times daily., Disp: 14 tablet, Rfl: 0   acetaminophen (TYLENOL) 325 MG tablet, Take 650 mg by mouth every 6 (six) hours as needed for moderate pain or headache., Disp: , Rfl:    ALPRAZolam (XANAX) 0.5 MG tablet, TAKE 1 TABLET BY MOUTH AT BEDTIME AS NEEDED FOR ANXIETY OR SLEEP, Disp: 30 tablet, Rfl: 2   aspirin EC 81 MG tablet, Take 81 mg by mouth daily. Swallow whole., Disp: , Rfl:    celecoxib (CELEBREX) 200 MG capsule, TAKE 1 CAPSULE BY MOUTH TWICE A DAY (Patient taking differently: Take 200 mg by mouth daily.), Disp: 60 capsule, Rfl: 2   DULoxetine (CYMBALTA) 20 MG capsule, Take 1 capsule (20 mg total) by mouth daily. (Patient taking differently: Take 40 mg by mouth daily.), Disp: 90 capsule, Rfl: 1   escitalopram (LEXAPRO) 10 MG tablet, TAKE 1 TABLET BY MOUTH EVERY DAY (Patient not taking: Reported on 11/21/2021), Disp: 90 tablet, Rfl: 1   ezetimibe (ZETIA) 10 MG tablet, Take 1 tablet (10 mg total) by mouth daily., Disp: 30 tablet, Rfl: 3   pantoprazole (PROTONIX) 40 MG tablet, TAKE 1 TABLET BY MOUTH EVERY DAY, Disp: 30 tablet, Rfl: 1   ticagrelor (BRILINTA) 90 MG TABS tablet, Take 1 tablet (90 mg total) by mouth 2 (two) times daily., Disp: 60 tablet, Rfl: 6 No current facility-administered medications for this visit.  Facility-Administered Medications Ordered in Other Visits:    aspirin tablet 325 mg, 325 mg, Oral, Once, Boisseau, Hayley, PA  Observations/Objective: Patient is well-developed, well-nourished in no acute distress.  Resting  comfortably at home.  Head is normocephalic, atraumatic.  No labored breathing. Speech is clear and coherent with logical content.  Patient is alert and oriented at baseline.    Assessment and Plan: 1. Suspected UTI - sulfamethoxazole-trimethoprim (BACTRIM DS) 800-160 MG tablet; Take 1 tablet by mouth 2 (two) times daily.  Dispense: 14 tablet; Refill: 0  - Worsening symptoms.  - Will treat empirically with Bactrim  - Continue to push fluids.  - She is to call or seek in person evaluation if symptoms do not improve or if they worsen.    Follow Up Instructions: I discussed the assessment and treatment plan with the patient. The patient was provided an opportunity to ask questions and all were answered. The patient agreed with the plan and demonstrated an understanding of the instructions.  A copy of instructions were sent to the patient via MyChart unless otherwise noted below.    The patient was advised to call back or seek an in-person evaluation if  the symptoms worsen or if the condition fails to improve as anticipated.  Time:  I spent 10 minutes with the patient via telehealth technology discussing the above problems/concerns.    Mar Daring, PA-C

## 2022-01-01 NOTE — Patient Instructions (Signed)
[name], thank you for joining Mar Daring, PA-C for today's virtual visit.  While this provider is not your primary care provider (PCP), if your PCP is located in our provider database this encounter information will be shared with them immediately following your visit.  Consent: (Patient) Katrina Woods provided verbal consent for this virtual visit at the beginning of the encounter.  Current Medications:  Current Outpatient Medications:    sulfamethoxazole-trimethoprim (BACTRIM DS) 800-160 MG tablet, Take 1 tablet by mouth 2 (two) times daily., Disp: 14 tablet, Rfl: 0   acetaminophen (TYLENOL) 325 MG tablet, Take 650 mg by mouth every 6 (six) hours as needed for moderate pain or headache., Disp: , Rfl:    ALPRAZolam (XANAX) 0.5 MG tablet, TAKE 1 TABLET BY MOUTH AT BEDTIME AS NEEDED FOR ANXIETY OR SLEEP, Disp: 30 tablet, Rfl: 2   aspirin EC 81 MG tablet, Take 81 mg by mouth daily. Swallow whole., Disp: , Rfl:    celecoxib (CELEBREX) 200 MG capsule, TAKE 1 CAPSULE BY MOUTH TWICE A DAY (Patient taking differently: Take 200 mg by mouth daily.), Disp: 60 capsule, Rfl: 2   DULoxetine (CYMBALTA) 20 MG capsule, Take 1 capsule (20 mg total) by mouth daily. (Patient taking differently: Take 40 mg by mouth daily.), Disp: 90 capsule, Rfl: 1   escitalopram (LEXAPRO) 10 MG tablet, TAKE 1 TABLET BY MOUTH EVERY DAY (Patient not taking: Reported on 11/21/2021), Disp: 90 tablet, Rfl: 1   ezetimibe (ZETIA) 10 MG tablet, Take 1 tablet (10 mg total) by mouth daily., Disp: 30 tablet, Rfl: 3   pantoprazole (PROTONIX) 40 MG tablet, TAKE 1 TABLET BY MOUTH EVERY DAY, Disp: 30 tablet, Rfl: 1   ticagrelor (BRILINTA) 90 MG TABS tablet, Take 1 tablet (90 mg total) by mouth 2 (two) times daily., Disp: 60 tablet, Rfl: 6 No current facility-administered medications for this visit.  Facility-Administered Medications Ordered in Other Visits:    aspirin tablet 325 mg, 325 mg, Oral, Once,  Boisseau, Hayley, PA   Medications ordered in this encounter:  Meds ordered this encounter  Medications   sulfamethoxazole-trimethoprim (BACTRIM DS) 800-160 MG tablet    Sig: Take 1 tablet by mouth 2 (two) times daily.    Dispense:  14 tablet    Refill:  0    Order Specific Question:   Supervising Provider    Answer:   Sabra Heck, BRIAN [[id number]]     *If you need refills on other medications prior to your next appointment, please contact your pharmacy*  Follow-Up: Call back or seek an in-person evaluation if the symptoms worsen or if the condition fails to improve as anticipated.  Other Instructions Urinary Tract Infection, Adult A urinary tract infection (UTI) is an infection of any part of the urinary tract. The urinary tract includes the kidneys, ureters, bladder, and urethra. These organs make, store, and get rid of urine in the body. An upper UTI affects the ureters and kidneys. A lower UTI affects the bladder and urethra. What are the causes? Most urinary tract infections are caused by bacteria in your genital area around your urethra, where urine leaves your body. These bacteria grow and cause inflammation of your urinary tract. What increases the risk? You are more likely to develop this condition if: You have a urinary catheter that stays in place. You are not able to control when you urinate or have a bowel movement (incontinence). You are female and you: Use a spermicide or diaphragm for birth control. Have low  estrogen levels. Are pregnant. You have certain genes that increase your risk. You are sexually active. You take antibiotic medicines. You have a condition that causes your flow of urine to slow down, such as: An enlarged prostate, if you are female. Blockage in your urethra. A kidney stone. A nerve condition that affects your bladder control (neurogenic bladder). Not getting enough to drink, or not urinating often. You have certain medical conditions, such  as: Diabetes. A weak disease-fighting system (immunesystem). Sickle cell disease. Gout. Spinal cord injury. What are the signs or symptoms? Symptoms of this condition include: Needing to urinate right away (urgency). Frequent urination. This may include small amounts of urine each time you urinate. Pain or burning with urination. Blood in the urine. Urine that smells bad or unusual. Trouble urinating. Cloudy urine. Vaginal discharge, if you are female. Pain in the abdomen or the lower back. You may also have: Vomiting or a decreased appetite. Confusion. Irritability or tiredness. A fever or chills. Diarrhea. The first symptom in older adults may be confusion. In some cases, they may not have any symptoms until the infection has worsened. How is this diagnosed? This condition is diagnosed based on your medical history and a physical exam. You may also have other tests, including: Urine tests. Blood tests. Tests for STIs (sexually transmitted infections). If you have had more than one UTI, a cystoscopy or imaging studies may be done to determine the cause of the infections. How is this treated? Treatment for this condition includes: Antibiotic medicine. Over-the-counter medicines to treat discomfort. Drinking enough water to stay hydrated. If you have frequent infections or have other conditions such as a kidney stone, you may need to see a health care provider who specializes in the urinary tract (urologist). In rare cases, urinary tract infections can cause sepsis. Sepsis is a life-threatening condition that occurs when the body responds to an infection. Sepsis is treated in the hospital with IV antibiotics, fluids, and other medicines. Follow these instructions at home: Medicines Take over-the-counter and prescription medicines only as told by your health care provider. If you were prescribed an antibiotic medicine, take it as told by your health care provider. Do not stop  using the antibiotic even if you start to feel better. General instructions Make sure you: Empty your bladder often and completely. Do not hold urine for long periods of time. Empty your bladder after sex. Wipe from front to back after urinating or having a bowel movement if you are female. Use each tissue only one time when you wipe. Drink enough fluid to keep your urine pale yellow. Keep all follow-up visits. This is important. Contact a health care provider if: Your symptoms do not get better after 1-2 days. Your symptoms go away and then return. Get help right away if: You have severe pain in your back or your lower abdomen. You have a fever or chills. You have nausea or vomiting. Summary A urinary tract infection (UTI) is an infection of any part of the urinary tract, which includes the kidneys, ureters, bladder, and urethra. Most urinary tract infections are caused by bacteria in your genital area. Treatment for this condition often includes antibiotic medicines. If you were prescribed an antibiotic medicine, take it as told by your health care provider. Do not stop using the antibiotic even if you start to feel better. Keep all follow-up visits. This is important. This information is not intended to replace advice given to you by your health care provider. Make sure  you discuss any questions you have with your health care provider. Document Revised: 07/08/2020 Document Reviewed: 07/08/2020 Elsevier Patient Education  2022 [employer].    If you have been instructed to have an in-person evaluation today at a local Urgent Care facility, please use the link below. It will take you to a list of all of our available [facility], including address, phone number and hours of operation. Please do not delay care.  [facility]  If you or a family member do not have a primary care provider, use the link below to schedule a visit and establish care. When you  choose a [facility] primary care physician or advanced practice provider, you gain a long-term partner in health. Find a Primary Care Provider  Learn more about [facility]'s in-office and virtual care options: [facility]

## 2022-01-04 ENCOUNTER — Other Ambulatory Visit: Payer: Self-pay | Admitting: Internal Medicine

## 2022-01-30 ENCOUNTER — Ambulatory Visit (INDEPENDENT_AMBULATORY_CARE_PROVIDER_SITE_OTHER): Payer: PPO | Admitting: Internal Medicine

## 2022-01-30 ENCOUNTER — Encounter: Payer: Self-pay | Admitting: Internal Medicine

## 2022-01-30 ENCOUNTER — Other Ambulatory Visit: Payer: Self-pay

## 2022-01-30 VITALS — BP 110/70 | HR 66 | Temp 97.7°F | Ht 62.0 in | Wt 133.6 lb

## 2022-01-30 DIAGNOSIS — K219 Gastro-esophageal reflux disease without esophagitis: Secondary | ICD-10-CM

## 2022-01-30 DIAGNOSIS — F418 Other specified anxiety disorders: Secondary | ICD-10-CM

## 2022-01-30 DIAGNOSIS — E785 Hyperlipidemia, unspecified: Secondary | ICD-10-CM

## 2022-01-30 DIAGNOSIS — L720 Epidermal cyst: Secondary | ICD-10-CM

## 2022-01-30 DIAGNOSIS — I671 Cerebral aneurysm, nonruptured: Secondary | ICD-10-CM

## 2022-01-30 LAB — LIPID PANEL W/REFLEX DIRECT LDL
Cholesterol: 227 mg/dL — ABNORMAL HIGH (ref <200)
HDL: 78 mg/dL (ref >=50)
LDL Cholesterol (Calc): 121 mg/dL (calc) — ABNORMAL HIGH
Non-HDL Cholesterol (Calc): 149 mg/dL (calc) — ABNORMAL HIGH (ref <130)
Total CHOL/HDL Ratio: 2.9 (calc) (ref <5.0)
Triglycerides: 163 mg/dL — ABNORMAL HIGH (ref <150)

## 2022-01-30 MED ORDER — TIZANIDINE HCL 4 MG PO TABS: 4.0000 mg | ORAL_TABLET | Freq: Four times a day (QID) | ORAL | 0 refills | Status: DC | PRN

## 2022-01-30 MED ORDER — DULOXETINE HCL 40 MG PO CPEP: 40.0000 mg | ORAL_CAPSULE | Freq: Every day | ORAL | 2 refills | Status: DC

## 2022-01-30 MED ORDER — DEXLANSOPRAZOLE 60 MG PO CPDR: 60.0000 mg | DELAYED_RELEASE_CAPSULE | Freq: Every day | ORAL | 2 refills | Status: DC

## 2022-01-30 NOTE — Assessment & Plan Note (Signed)
Symptoms persistent and complicated by hiatal hernia .  Changing to Katrina Woods. Wedge pillow advised.

## 2022-01-30 NOTE — Assessment & Plan Note (Signed)
Taking Zetia . Checking ldl today

## 2022-01-30 NOTE — Assessment & Plan Note (Signed)
Second intervention attempted in December but not successful

## 2022-01-30 NOTE — Patient Instructions (Signed)
Cymbalta dose has been increased to 40 mg and sent to local pharmacy  Changing protonix to dexilant for GERD   You still have reflux though due to hiatal hernia  Get a wedge pillow  to manage the reflux

## 2022-01-30 NOTE — Progress Notes (Signed)
Subjective:  Patient ID: Katrina Woods, female    DOB: 05/15/1956  Age: 66 y.o. MRN: [id number]  CC: The primary encounter diagnosis was Epidermal cyst. Diagnoses of Depression with anxiety, Gastroesophageal reflux disease without esophagitis, Aneurysm, cerebral, nonruptured, and Hyperlipidemia LDL goal <100 were also pertinent to this visit.   This visit occurred during the SARS-CoV-2 public health emergency.  Safety protocols were in place, including screening questions prior to the visit, additional usage of staff PPE, and extensive cleaning of exam room while observing appropriate contact time as indicated for disinfecting solutions.    HPI Katrina Woods presents for  Chief Complaint  Patient presents with   Follow-up    3 month follow up    Had  another aneurysm surgery in December.   Coil not successfully placed.  Emotionally   fragile from the procedure.    Did feel better on the 40 mg cymbalta .  Days and nights mixed up  Still in conflict with partner/boyfriend.  Too emotionally exhausted to throw him out. She Just wants companionship; he craves sexual adventure .   GERD not controlled on 40 mg protonix.  Choking and coughing.  Prefers dexilant      Outpatient Medications Prior to Visit  Medication Sig Dispense Refill   acetaminophen (TYLENOL) 325 MG tablet Take 650 mg by mouth every 6 (six) hours as needed for moderate pain or headache.     ALPRAZolam (XANAX) 0.5 MG tablet TAKE 1 TABLET BY MOUTH AT BEDTIME AS NEEDED FOR ANXIETY OR SLEEP 30 tablet 2   aspirin EC 81 MG tablet Take 81 mg by mouth daily. Swallow whole.     celecoxib (CELEBREX) 200 MG capsule TAKE 1 CAPSULE BY MOUTH TWICE A DAY (Patient taking differently: Take 200 mg by mouth daily.) 60 capsule 2   ezetimibe (ZETIA) 10 MG tablet Take 1 tablet (10 mg total) by mouth daily. 30 tablet 3   ticagrelor (BRILINTA) 90 MG TABS tablet Take 1 tablet (90 mg total) by mouth 2 (two) times daily. 60  tablet 6   DULoxetine (CYMBALTA) 20 MG capsule Take 1 capsule (20 mg total) by mouth daily. 90 capsule 1   pantoprazole (PROTONIX) 40 MG tablet TAKE 1 TABLET BY MOUTH EVERY DAY 90 tablet 1   escitalopram (LEXAPRO) 10 MG tablet TAKE 1 TABLET BY MOUTH EVERY DAY (Patient not taking: Reported on 11/21/2021) 90 tablet 1   sulfamethoxazole-trimethoprim (BACTRIM DS) 800-160 MG tablet Take 1 tablet by mouth 2 (two) times daily. (Patient not taking: Reported on 01/30/2022) 14 tablet 0   Facility-Administered Medications Prior to Visit  Medication Dose Route Frequency Provider Last Rate Last Admin   aspirin tablet 325 mg  325 mg Oral Once Boisseau, Hayley, PA        Review of Systems;  Patient denies headache, fevers, malaise, unintentional weight loss, skin rash, eye pain, sinus congestion and sinus pain, sore throat, dysphagia,  hemoptysis , cough, dyspnea, wheezing, chest pain, palpitations, orthopnea, edema, abdominal pain, nausea, melena, diarrhea, constipation, flank pain, dysuria, hematuria, urinary  Frequency, nocturia, numbness, tingling, seizures,  Focal weakness, Loss of consciousness,  Tremor, insomnia, depression, anxiety, and suicidal ideation.      Objective:  BP 110/70 (BP Location: Left Arm, Patient Position: Sitting, Cuff Size: Normal)    Pulse 66    Temp 97.7 F (36.5 C) (Oral)    Ht 5\' 2"  (1.575 m)    Wt 133 lb 9.6 oz (60.6 kg)    SpO2 98%  BMI 24.44 kg/m   BP Readings from Last 3 Encounters:  01/30/22 110/70  11/24/21 (!) 123/98  10/30/21 126/78    Wt Readings from Last 3 Encounters:  01/30/22 133 lb 9.6 oz (60.6 kg)  11/23/21 134 lb 7.7 oz (61 kg)  10/30/21 136 lb 12.8 oz (62.1 kg)    General appearance: alert, cooperative and appears stated age Ears: normal TM's and external ear canals both ears Throat: lips, mucosa, and tongue normal; teeth and gums normal Neck: no adenopathy, no carotid bruit, supple, symmetrical, trachea midline and thyroid not enlarged,  symmetric, no tenderness/mass/nodules Back: symmetric, no curvature. ROM normal. No CVA tenderness. Lungs: clear to auscultation bilaterally Heart: regular rate and rhythm, S1, S2 normal, no murmur, click, rub or gallop Abdomen: soft, non-tender; bowel sounds normal; no masses,  no organomegaly Pulses: 2+ and symmetric Skin: Skin color, texture, turgor normal. No rashes or lesions Lymph nodes: Cervical, supraclavicular, and axillary nodes normal.  Lab Results  Component Value Date   HGBA1C 5.8 10/20/2021    Lab Results  Component Value Date   CREATININE 0.95 11/24/2021   CREATININE 0.82 11/23/2021   CREATININE 0.88 10/20/2021    Lab Results  Component Value Date   WBC 11.2 (H) 11/24/2021   HGB 11.3 (L) 11/24/2021   HCT 34.4 (L) 11/24/2021   PLT PLATELET CLUMPS NOTED ON SMEAR, UNABLE TO ESTIMATE 11/24/2021   GLUCOSE 97 11/24/2021   CHOL 243 (H) 10/20/2021   TRIG 133.0 10/20/2021   HDL 56.20 10/20/2021   LDLDIRECT 166.0 10/14/2017   LDLCALC 160 (H) 10/20/2021   ALT 5 10/20/2021   AST 13 10/20/2021   NA 136 11/24/2021   K 4.3 11/24/2021   CL 109 11/24/2021   CREATININE 0.95 11/24/2021   BUN 17 11/24/2021   CO2 21 (L) 11/24/2021   TSH 2.93 10/20/2021   INR 0.9 11/23/2021   HGBA1C 5.8 10/20/2021   MICROALBUR <0.7 10/26/2019    No results found.  Assessment & Plan:   Problem List Items Addressed This Visit     GERD (gastroesophageal reflux disease)    Symptoms persistent and complicated by hiatal hernia .  Changing to [place]. Wedge pillow advised.       Relevant Medications   dexlansoprazole (DEXILANT) 60 MG capsule   Aneurysm, cerebral, nonruptured    Second intervention attempted in December but not successful       Depression with anxiety    Increasing dose of cymbalta helped,  She would like to resume higher dose.        Relevant Medications   DULoxetine 40 MG CPEP   Epidermal cyst - Primary   Hyperlipidemia LDL goal <100    Taking Zetia .  Checking ldl today       Relevant Orders   Lipid Panel w/reflex Direct LDL    I spent 30 minutes dedicated to the care of this patient on the date of this encounter to include pre-visit review of patient's medical history,  most recent imaging studies, Face-to-face time with the patient , and post visit ordering of testing and therapeutics.    Follow-up: No follow-ups on file.   Crecencio Mc, MD

## 2022-01-30 NOTE — Assessment & Plan Note (Signed)
Increasing dose of cymbalta helped,  She would like to resume higher dose.

## 2022-02-02 ENCOUNTER — Other Ambulatory Visit: Payer: Self-pay | Admitting: Internal Medicine

## 2022-02-05 ENCOUNTER — Other Ambulatory Visit: Payer: Self-pay | Admitting: Internal Medicine

## 2022-02-05 ENCOUNTER — Encounter: Payer: Self-pay | Admitting: Internal Medicine

## 2022-02-05 DIAGNOSIS — F418 Other specified anxiety disorders: Secondary | ICD-10-CM

## 2022-02-05 DIAGNOSIS — K219 Gastro-esophageal reflux disease without esophagitis: Secondary | ICD-10-CM

## 2022-02-05 MED ORDER — LANSOPRAZOLE 30 MG PO CPDR: 30.0000 mg | DELAYED_RELEASE_CAPSULE | Freq: Two times a day (BID) | ORAL | 2 refills | Status: DC

## 2022-02-05 NOTE — Assessment & Plan Note (Signed)
cymbalta $100 OOP requesting alternative.  Suggesting return to lexapro

## 2022-02-05 NOTE — Assessment & Plan Note (Signed)
Prevacid bid prescribed in place of Dexilant due to $100  copay

## 2022-02-06 MED ORDER — ESCITALOPRAM OXALATE 10 MG PO TABS: 10.0000 mg | ORAL_TABLET | Freq: Every day | ORAL | 1 refills | Status: DC

## 2022-02-06 NOTE — Addendum Note (Signed)
Addended by: Crecencio Mc on: 02/06/2022 12:41 PM   Modules accepted: Orders

## 2022-02-06 NOTE — Telephone Encounter (Signed)
Lexapro sent as alternative to cymbalta

## 2022-02-06 NOTE — Assessment & Plan Note (Signed)
Lexapro sent as alternative to cymbalta

## 2022-02-08 ENCOUNTER — Ambulatory Visit
Admission: RE | Admit: 2022-02-08 | Discharge: 2022-02-08 | Disposition: A | Discharge status: Home / Self Care | Payer: PPO | Source: Ambulatory Visit | Attending: Internal Medicine | Admitting: Internal Medicine

## 2022-02-08 DIAGNOSIS — Z1231 Encounter for screening mammogram for malignant neoplasm of breast: Secondary | ICD-10-CM | POA: Diagnosis not present

## 2022-02-10 ENCOUNTER — Other Ambulatory Visit: Payer: Self-pay | Admitting: Internal Medicine

## 2022-03-19 ENCOUNTER — Other Ambulatory Visit: Payer: Self-pay | Admitting: Internal Medicine

## 2022-04-15 ENCOUNTER — Encounter: Payer: Self-pay | Admitting: Internal Medicine

## 2022-04-15 DIAGNOSIS — F418 Other specified anxiety disorders: Secondary | ICD-10-CM

## 2022-04-18 MED ORDER — ESCITALOPRAM OXALATE 20 MG PO TABS: 20.0000 mg | ORAL_TABLET | Freq: Every day | ORAL | 0 refills | Status: DC

## 2022-04-18 NOTE — Assessment & Plan Note (Signed)
Taking lexapro at 10 mg starting dose,  Has moved up to 20 mg daily  ?

## 2022-04-28 ENCOUNTER — Telehealth: Payer: PPO | Admitting: Nurse Practitioner

## 2022-04-28 DIAGNOSIS — R3989 Other symptoms and signs involving the genitourinary system: Secondary | ICD-10-CM | POA: Diagnosis not present

## 2022-04-28 MED ORDER — CEPHALEXIN 500 MG PO CAPS: 500.0000 mg | ORAL_CAPSULE | Freq: Two times a day (BID) | ORAL | 0 refills | Status: AC

## 2022-04-28 MED ORDER — PHENAZOPYRIDINE HCL 200 MG PO TABS: 200.0000 mg | ORAL_TABLET | Freq: Three times a day (TID) | ORAL | 0 refills | Status: DC | PRN

## 2022-04-28 NOTE — Progress Notes (Signed)
Virtual Visit Consent   Katrina Woods, you are scheduled for a virtual visit with a [facility] provider today. Just as with appointments in the office, your consent must be obtained to participate. Your consent will be active for this visit and any virtual visit you may have with one of our providers in the next 365 days. If you have a MyChart account, a copy of this consent can be sent to you electronically.  As this is a virtual visit, video technology does not allow for your provider to perform a traditional examination. This may limit your provider's ability to fully assess your condition. If your provider identifies any concerns that need to be evaluated in person or the need to arrange testing (such as labs, EKG, etc.), we will make arrangements to do so. Although advances in technology are sophisticated, we cannot ensure that it will always work on either your end or our end. If the connection with a video visit is poor, the visit may have to be switched to a telephone visit. With either a video or telephone visit, we are not always able to ensure that we have a secure connection.  By engaging in this virtual visit, you consent to the provision of healthcare and authorize for your insurance to be billed (if applicable) for the services provided during this visit. Depending on your insurance coverage, you may receive a charge related to this service.  I need to obtain your verbal consent now. Are you willing to proceed with your visit today? Charae Depaolis has provided verbal consent on 04/28/2022 for a virtual visit (video or telephone). Gildardo Pounds, NP  Date: 04/28/2022 3:51 PM  Virtual Visit via Video Note   I, Gildardo Pounds, connected with  Anu Stagner  ([id number], 08/02/1956) on 04/28/22 at  3:45 PM EDT by a video-enabled telemedicine application and verified that I am speaking with the correct person using two identifiers.  Location: Patient: Virtual  Visit Location Patient: Home Provider: Virtual Visit Location Provider: Home Office   I discussed the limitations of evaluation and management by telemedicine and the availability of in person appointments. The patient expressed understanding and agreed to proceed.    History of Present Illness: Katrina Woods is a 66 y.o. who identifies as a female who was assigned female at birth, and is being seen today for UTI symptoms.  Ms. Boutwell notes 2-day onset of pelvic pressure, fever with temperature 99.9-100, hematuria, dysuria and discomfort at the completion of urination  She denies flank pain, nausea vomiting, chills.   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   Encounter for Papanicolaou smear for cervical cancer screening 10/31/2021   Relationship dysfunction 10/30/2021   Shortened PR interval 03/14/2021   Neck pain, musculoskeletal 03/12/2021   Syncope and collapse 03/12/2021   Aortic atherosclerosis ([place]) 08/22/2020   Personal history of COVID-19 06/19/2020   Polyarthritis 10/26/2019   Orthostasis 10/26/2019   Weight loss 10/26/2019   OSA on CPAP 10/22/2018   Sciatica of right side 10/15/2017   Epidermal cyst 04/18/2017   Shingles 03/02/2016   Myalgia due to statin 10/11/2013   Fatty infiltration of liver 10/11/2013   Encounter for preventive health examination 04/17/2013   Tubular adenoma of colon 12/03/2012   GERD (gastroesophageal reflux disease) 10/16/2012   Tobacco abuse 09/12/2012   Depression with anxiety 09/12/2012   Back pain, thoracic 09/12/2012   Hiatal hernia    Hyperlipidemia LDL  goal <100    Aneurysm, cerebral, nonruptured     Allergies:  Allergies  Allergen Reactions   Etodolac     Unknown reaction   Pollen Extract     Other reaction(s): Unknown   Statins     Muscle aches   Medications:  Current Outpatient Medications:    cephALEXin (KEFLEX) 500 MG capsule, Take 1 capsule (500 mg total) by mouth 2  (two) times daily for 7 days., Disp: 14 capsule, Rfl: 0   phenazopyridine (PYRIDIUM) 200 MG tablet, Take 1 tablet (200 mg total) by mouth 3 (three) times daily as needed for pain., Disp: 6 tablet, Rfl: 0   acetaminophen (TYLENOL) 325 MG tablet, Take 650 mg by mouth every 6 (six) hours as needed for moderate pain or headache., Disp: , Rfl:    ALPRAZolam (XANAX) 0.5 MG tablet, TAKE 1 TABLET BY MOUTH AT BEDTIME AS NEEDED FOR ANXIETY OR SLEEP, Disp: 30 tablet, Rfl: 2   aspirin EC 81 MG tablet, Take 81 mg by mouth daily. Swallow whole., Disp: , Rfl:    celecoxib (CELEBREX) 200 MG capsule, TAKE 1 CAPSULE BY MOUTH TWICE A DAY, Disp: 60 capsule, Rfl: 2   escitalopram (LEXAPRO) 20 MG tablet, Take 1 tablet (20 mg total) by mouth daily., Disp: 90 tablet, Rfl: 0   ezetimibe (ZETIA) 10 MG tablet, TAKE 1 TABLET BY MOUTH EVERY DAY, Disp: 30 tablet, Rfl: 3   lansoprazole (PREVACID) 30 MG capsule, Take 1 capsule (30 mg total) by mouth 2 (two) times daily before a meal., Disp: 60 capsule, Rfl: 2   ticagrelor (BRILINTA) 90 MG TABS tablet, Take 1 tablet (90 mg total) by mouth 2 (two) times daily., Disp: 60 tablet, Rfl: 6   tiZANidine (ZANAFLEX) 4 MG tablet, Take 1 tablet (4 mg total) by mouth every 6 (six) hours as needed for muscle spasms., Disp: 30 tablet, Rfl: 0 No current facility-administered medications for this visit.  Facility-Administered Medications Ordered in Other Visits:    aspirin tablet 325 mg, 325 mg, Oral, Once, Boisseau, Hayley, PA  Observations/Objective: Patient is well-developed, well-nourished in no acute distress.  Resting comfortably  at home.  Head is normocephalic, atraumatic.  No labored breathing.  Speech is clear and coherent with logical content.  Patient is alert and oriented at baseline.  Lying on couch with heating pad on abdomen  Assessment and Plan: 1. Suspected UTI - cephALEXin (KEFLEX) 500 MG capsule; Take 1 capsule (500 mg total) by mouth 2 (two) times daily for 7 days.   Dispense: 14 capsule; Refill: 0 - phenazopyridine (PYRIDIUM) 200 MG tablet; Take 1 tablet (200 mg total) by mouth 3 (three) times daily as needed for pain.  Dispense: 6 tablet; Refill: 0  Remember to wipe from front to back after urination  Follow Up Instructions: I discussed the assessment and treatment plan with the patient. The patient was provided an opportunity to ask questions and all were answered. The patient agreed with the plan and demonstrated an understanding of the instructions.  A copy of instructions were sent to the patient via MyChart unless otherwise noted below.    The patient was advised to call back or seek an in-person evaluation if the symptoms worsen or if the condition fails to improve as anticipated.  Time:  I spent 11 minutes with the patient via telehealth technology discussing the above problems/concerns as well as review of pertinent medical information in chart.    Gildardo Pounds, NP

## 2022-04-28 NOTE — Patient Instructions (Signed)
[name], thank you for joining Gildardo Pounds, NP for today's virtual visit.  While this provider is not your primary care provider (PCP), if your PCP is located in our provider database this encounter information will be shared with them immediately following your visit.  Consent: (Patient) Mylin Gignac provided verbal consent for this virtual visit at the beginning of the encounter.  Current Medications:  Current Outpatient Medications:    cephALEXin (KEFLEX) 500 MG capsule, Take 1 capsule (500 mg total) by mouth 2 (two) times daily for 7 days., Disp: 14 capsule, Rfl: 0   phenazopyridine (PYRIDIUM) 200 MG tablet, Take 1 tablet (200 mg total) by mouth 3 (three) times daily as needed for pain., Disp: 6 tablet, Rfl: 0   acetaminophen (TYLENOL) 325 MG tablet, Take 650 mg by mouth every 6 (six) hours as needed for moderate pain or headache., Disp: , Rfl:    ALPRAZolam (XANAX) 0.5 MG tablet, TAKE 1 TABLET BY MOUTH AT BEDTIME AS NEEDED FOR ANXIETY OR SLEEP, Disp: 30 tablet, Rfl: 2   aspirin EC 81 MG tablet, Take 81 mg by mouth daily. Swallow whole., Disp: , Rfl:    celecoxib (CELEBREX) 200 MG capsule, TAKE 1 CAPSULE BY MOUTH TWICE A DAY, Disp: 60 capsule, Rfl: 2   escitalopram (LEXAPRO) 20 MG tablet, Take 1 tablet (20 mg total) by mouth daily., Disp: 90 tablet, Rfl: 0   ezetimibe (ZETIA) 10 MG tablet, TAKE 1 TABLET BY MOUTH EVERY DAY, Disp: 30 tablet, Rfl: 3   lansoprazole (PREVACID) 30 MG capsule, Take 1 capsule (30 mg total) by mouth 2 (two) times daily before a meal., Disp: 60 capsule, Rfl: 2   ticagrelor (BRILINTA) 90 MG TABS tablet, Take 1 tablet (90 mg total) by mouth 2 (two) times daily., Disp: 60 tablet, Rfl: 6   tiZANidine (ZANAFLEX) 4 MG tablet, Take 1 tablet (4 mg total) by mouth every 6 (six) hours as needed for muscle spasms., Disp: 30 tablet, Rfl: 0 No current facility-administered medications for this visit.  Facility-Administered Medications Ordered in  Other Visits:    aspirin tablet 325 mg, 325 mg, Oral, Once, Boisseau, Hayley, PA   Medications ordered in this encounter:  Meds ordered this encounter  Medications   cephALEXin (KEFLEX) 500 MG capsule    Sig: Take 1 capsule (500 mg total) by mouth 2 (two) times daily for 7 days.    Dispense:  14 capsule    Refill:  0    Order Specific Question:   Supervising Provider    Answer:   Sabra Heck, BRIAN [[id number]]   phenazopyridine (PYRIDIUM) 200 MG tablet    Sig: Take 1 tablet (200 mg total) by mouth 3 (three) times daily as needed for pain.    Dispense:  6 tablet    Refill:  0    Order Specific Question:   Supervising Provider    Answer:   Sabra Heck, BRIAN [[id number]]     *If you need refills on other medications prior to your next appointment, please contact your pharmacy*  Follow-Up: Call back or seek an in-person evaluation if the symptoms worsen or if the condition fails to improve as anticipated.  Other Instructions Remember to wipe from front to back after urination   If you have been instructed to have an in-person evaluation today at a local Urgent Care facility, please use the link below. It will take you to a list of all of our available  Urgent Cares, including address, phone number and  hours of operation. Please do not delay care.  [facility]  If you or a family member do not have a primary care provider, use the link below to schedule a visit and establish care. When you choose a [facility] primary care physician or advanced practice provider, you gain a long-term partner in health. Find a Primary Care Provider  Learn more about [facility]'s in-office and virtual care options: [facility]

## 2022-05-11 ENCOUNTER — Other Ambulatory Visit: Payer: Self-pay | Admitting: Internal Medicine

## 2022-05-30 ENCOUNTER — Other Ambulatory Visit (HOSPITAL_COMMUNITY): Payer: Self-pay | Admitting: Interventional Radiology

## 2022-05-30 DIAGNOSIS — I671 Cerebral aneurysm, nonruptured: Secondary | ICD-10-CM

## 2022-06-15 ENCOUNTER — Ambulatory Visit
Admission: RE | Admit: 2022-06-15 | Discharge: 2022-06-15 | Disposition: A | Discharge status: Home / Self Care | Payer: PPO | Source: Ambulatory Visit | Attending: Internal Medicine | Admitting: Internal Medicine

## 2022-06-15 DIAGNOSIS — Z780 Asymptomatic menopausal state: Secondary | ICD-10-CM

## 2022-06-15 DIAGNOSIS — M8589 Other specified disorders of bone density and structure, multiple sites: Secondary | ICD-10-CM | POA: Diagnosis not present

## 2022-06-16 ENCOUNTER — Other Ambulatory Visit: Payer: Self-pay | Admitting: Internal Medicine

## 2022-07-10 ENCOUNTER — Other Ambulatory Visit: Payer: Self-pay | Admitting: Internal Medicine

## 2022-07-11 ENCOUNTER — Telehealth (HOSPITAL_COMMUNITY): Payer: Self-pay

## 2022-07-11 ENCOUNTER — Telehealth (HOSPITAL_COMMUNITY): Payer: Self-pay | Admitting: Student

## 2022-07-11 MED ORDER — TICAGRELOR 90 MG PO TABS: 90.0000 mg | ORAL_TABLET | Freq: Two times a day (BID) | ORAL | 6 refills | Status: DC

## 2022-07-11 NOTE — Telephone Encounter (Signed)
Brilinta prescription refilled. E-prescribed to [facility] on file. Brilinta 90 mg BID x 30 days with 6 refills. Voice mail message left on patient's phone.  Soyla Dryer, [place] [phone] 07/11/2022, 10:01 AM

## 2022-07-11 NOTE — Telephone Encounter (Signed)
Pt called for a Brilinta refill. I've sent a message to our PA. AW

## 2022-07-14 ENCOUNTER — Other Ambulatory Visit: Payer: Self-pay | Admitting: Internal Medicine

## 2022-07-30 ENCOUNTER — Ambulatory Visit (HOSPITAL_COMMUNITY)
Admission: RE | Admit: 2022-07-30 | Discharge: 2022-07-30 | Disposition: A | Discharge status: Home / Self Care | Payer: PPO | Source: Ambulatory Visit | Attending: Interventional Radiology | Admitting: Interventional Radiology

## 2022-07-30 DIAGNOSIS — G3189 Other specified degenerative diseases of nervous system: Secondary | ICD-10-CM | POA: Diagnosis not present

## 2022-07-30 DIAGNOSIS — G9389 Other specified disorders of brain: Secondary | ICD-10-CM | POA: Diagnosis not present

## 2022-07-30 DIAGNOSIS — I671 Cerebral aneurysm, nonruptured: Secondary | ICD-10-CM | POA: Diagnosis not present

## 2022-08-01 ENCOUNTER — Telehealth: Payer: Self-pay | Admitting: Student

## 2022-08-01 NOTE — Telephone Encounter (Signed)
Patient is s/p endovascular tx of basilar aneurysm with Dr. Estanislado Pandy on 03/09/2016 and again on 11/27/21.   MR/MRA reviewed by Dr. Estanislado Pandy, no concerns.  Per Dr. Estanislado Pandy, patient to start taking Brilinta 45 mg (instead of 90) twice until she finishes what is left at home, continue to take aspirin 81 mg every day.  F/u MR/MRA brian in 6 months - our schedulers will call the patient to schedule the MR/MRA.   Patient was called to inform above, no answer.  Left VM with call back number. Please call [facility] for questions and concerns.    Armando Gang Azeez Dunker PA-C 08/01/2022 4:01 PM

## 2022-08-02 ENCOUNTER — Telehealth: Payer: Self-pay | Admitting: Student

## 2022-08-02 NOTE — Telephone Encounter (Signed)
Interventional Radiology Brief Note:  Patient returned call from yesterday.  Instructed to begin Brilinta '45mg'$  BID (a decrease in dose from '90mg'$ ) per documentation.  Patient confirmed understanding.  Reports occasional headaches without concerning features and is agreeable to repeat imaging in 6 months as planned.   Brynda Greathouse, MS RD PA-C 10:55 AM

## 2022-08-19 ENCOUNTER — Other Ambulatory Visit: Payer: Self-pay | Admitting: Internal Medicine

## 2022-09-17 ENCOUNTER — Telehealth: Payer: Self-pay | Admitting: *Deleted

## 2022-09-17 NOTE — Patient Outreach (Signed)
  Care Coordination   09/17/2022 Name: Shemika Robbs MRN: [id number] DOB: 1956-08-03   Care Coordination Outreach Attempts:  An unsuccessful telephone outreach was attempted today to offer the patient information about available care coordination services as a benefit of their health plan.   Follow Up Plan:  Additional outreach attempts will be made to offer the patient care coordination information and services.   Encounter Outcome:  No Answer  Care Coordination Interventions Activated:  Yes   Care Coordination Interventions:  No, not indicated    [facility] [phone]

## 2022-09-20 DIAGNOSIS — S93491A Sprain of other ligament of right ankle, initial encounter: Secondary | ICD-10-CM | POA: Diagnosis not present

## 2022-09-24 ENCOUNTER — Ambulatory Visit (INDEPENDENT_AMBULATORY_CARE_PROVIDER_SITE_OTHER): Payer: PPO

## 2022-09-24 VITALS — Ht 62.0 in | Wt 133.0 lb

## 2022-09-24 DIAGNOSIS — Z0000 Encounter for general adult medical examination without abnormal findings: Secondary | ICD-10-CM

## 2022-09-24 NOTE — Progress Notes (Addendum)
Subjective:   Katrina Woods is a 66 y.o. female who presents for an Initial Medicare Annual Wellness Visit.  Review of Systems    No ROS.  Medicare Wellness Virtual Visit.  Visual/audio telehealth visit, UTA vital signs.   See social history for additional risk factors.   Cardiac Risk Factors include: advanced age (>72mn, >>36women)     Objective:    Today's Vitals   09/24/22 1436  Weight: 133 lb (60.3 kg)  Height: '5\' 2"'$  (1.575 m)   Body mass index is 24.33 kg/m.     09/24/2022    2:40 PM 11/23/2021    1:44 PM 11/22/2021   10:06 AM 03/12/2017    4:17 PM 01/27/2016    7:21 AM 05/11/2015    2:00 PM 05/05/2015   10:12 AM  Advanced Directives  Does Patient Have a Medical Advance Directive? Yes Yes Yes No Yes Yes Yes  Type of AParamedicof ALittle [place] will [place]Living will [place]Living will  Healthcare Power of AM[place]villeLiving will H[place] Canyon CityLiving will  Does patient want to make changes to medical advance directive? No - Patient declined No - Patient declined No - Patient declined  No - Patient declined No - Patient declined   Copy of HClarence Centerin Chart? No - copy requested No - copy requested No - copy requested  No - copy requested No - copy requested No - copy requested  Would patient like information on creating a medical advance directive?    No - Patient declined       Current Medications (verified) Outpatient Encounter Medications as of 09/24/2022  Medication Sig   acetaminophen (TYLENOL) 325 MG tablet Take 650 mg by mouth every 6 (six) hours as needed for moderate pain or headache.   ALPRAZolam (XANAX) 0.5 MG tablet TAKE 1 TABLET BY MOUTH AT BEDTIME AS NEEDED FOR ANXIETY OR SLEEP   aspirin EC 81 MG tablet Take 81 mg by mouth daily. Swallow whole.   celecoxib (CELEBREX) 200 MG capsule TAKE 1 CAPSULE BY MOUTH TWICE A DAY    escitalopram (LEXAPRO) 20 MG tablet TAKE 1 TABLET BY MOUTH EVERY DAY   ezetimibe (ZETIA) 10 MG tablet TAKE 1 TABLET BY MOUTH EVERY DAY   lansoprazole (PREVACID) 30 MG capsule TAKE 1 CAPSULE (30 MG TOTAL) BY MOUTH 2 (TWO) TIMES DAILY BEFORE A MEAL.   phenazopyridine (PYRIDIUM) 200 MG tablet Take 1 tablet (200 mg total) by mouth 3 (three) times daily as needed for pain.   ticagrelor (BRILINTA) 90 MG TABS tablet Take 1 tablet (90 mg total) by mouth 2 (two) times daily.   tiZANidine (ZANAFLEX) 4 MG tablet Take 1 tablet (4 mg total) by mouth every 6 (six) hours as needed for muscle spasms.   Facility-Administered Encounter Medications as of 09/24/2022  Medication   aspirin tablet 325 mg    Allergies (verified) Etodolac, Pollen extract, and Statins   History: Past Medical History:  Diagnosis Date   Allergy    Aneurysm (HMystic    cerebral   Back pain    Bursitis of left hip 07/2012   takes Meloxicam daily    Depression    takes Lexapro daily   GERD (gastroesophageal reflux disease)    takes Omeprazole daily   Headache    Hiatal hernia 01/2008   History of bronchitis 2015   History of colon polyps    benign  History of kidney stones    History of shingles    Hyperlipidemia    takes Atorvastatin daily   Insomnia    takes Xanax nightly   Sleep apnea    can not tolerate Cpap   Weakness    numbness and tingling-thinks its carpal tunnel   Past Surgical History:  Procedure Laterality Date   ABDOMINAL HYSTERECTOMY  2004   total   CEREBRAL ANEURYSM REPAIR  2006   Deveshwar, coil    CESAREAN SECTION  11/25/1982   COLONOSCOPY     DILATION AND CURETTAGE OF UTERUS     ESOPHAGOGASTRODUODENOSCOPY     IR 3D INDEPENDENT WKST  11/23/2021   IR ANGIO VERTEBRAL SEL VERTEBRAL UNI R MOD SED  11/23/2021   IR CT HEAD LTD  11/23/2021   IR GENERIC HISTORICAL  10/23/2016   IR RADIOLOGIST EVAL & MGMT 10/23/2016 [facility]   IR RADIOLOGIST EVAL & MGMT  09/18/2021   IR RADIOLOGIST EVAL &  MGMT  12/18/2021   IR TRANSCATH/EMBOLIZ  11/23/2021   IR US GUIDE VASC ACCESS RIGHT  11/23/2021   KNEE CARTILAGE SURGERY Left 1987   POLYPECTOMY     RADIOLOGY WITH ANESTHESIA N/A 05/11/2015   Procedure: ANEURYSM EMBOLIZATION;  Surgeon: Luanne Bras, MD;  Location: [place];  Service: Radiology;  Laterality: N/A;   RADIOLOGY WITH ANESTHESIA N/A 11/23/2021   Procedure: IR WITH ANESTHESIA EMBOLIZATION;  Surgeon: Luanne Bras, MD;  Location: [place];  Service: Radiology;  Laterality: N/A;   [place]   all four extracted   Family History  Problem Relation Age of Onset   Diabetes Mother        type 2   Hypertension Mother    Heart disease Mother        CHF   Cancer Mother        lung- previous smoker- had quit 13 yrs before   Appendicitis Father        acute   Cancer Father        prostate?   Breast cancer Maternal Grandmother        34s   Cancer Maternal Grandmother        breast, uterine   Hypertension Maternal Grandmother    Heart disease Maternal Grandmother        CHF   Diabetes Maternal Grandmother    Stroke Maternal Grandfather    Heart block Paternal Grandfather        massive   Diabetes Brother 9       type 2   Hypertension Brother    Colon cancer Neg Hx    Stomach cancer Neg Hx    Colon polyps Neg Hx    Esophageal cancer Neg Hx    Rectal cancer Neg Hx    Social History   Socioeconomic History   Marital status: Single    Spouse name: Not on file   Number of children: Not on file   Years of education: Not on file   Highest education level: Not on file  Occupational History   Not on file  Tobacco Use   Smoking status: Former    Types: Cigarettes   Smokeless tobacco: Never   Tobacco comments:    quit smoking in Nov 2015  Vaping Use   Vaping Use: Some days  Substance and Sexual Activity   Alcohol use: Yes    Alcohol/week: 3.0 standard drinks of alcohol    Types: 3 Glasses of wine per week  Comment: occasional wine   Drug  use: No   Sexual activity: Not Currently  Other Topics Concern   Not on file  Social History Narrative   Not on file   Social Determinants of Health   Financial Resource Strain: Low Risk  (09/24/2022)   Overall Financial Resource Strain (CARDIA)    Difficulty of Paying Living Expenses: Not hard at all  Food Insecurity: No Food Insecurity (09/24/2022)   Hunger Vital Sign    Worried About Running Out of Food in the Last Year: Never true    Ran Out of Food in the Last Year: Never true  Transportation Needs: No Transportation Needs (09/24/2022)   PRAPARE - Hydrologist (Medical): No    Lack of Transportation (Non-Medical): No  Physical Activity: Not on file  Stress: No Stress Concern Present (09/24/2022)   [place]    Feeling of Stress : Not at all  Social Connections: Unknown (09/24/2022)   Social Connection and Isolation Panel [NHANES]    Frequency of Communication with Friends and Family: More than three times a week    Frequency of Social Gatherings with Friends and Family: More than three times a week    Attends Religious Services: Not on file    Active Member of [place] or Organizations: Not on file    Attends Archivist Meetings: Not on file    Marital Status: Not on file    Tobacco Counseling Counseling given: Not Answered Tobacco comments: quit smoking in Nov 2015   Clinical Intake:  Pre-visit preparation completed: Yes        Diabetes: No  How often do you need to have someone help you when you read instructions, pamphlets, or other written materials from your doctor or pharmacy?: 1 - Never    Interpreter Needed?: No      Activities of Daily Living    09/24/2022    2:37 PM 11/23/2021    1:44 PM  In your present state of health, do you have any difficulty performing the following activities:  Hearing? 0 0  Vision? 0 0  Difficulty concentrating  or making decisions? 0 0  Walking or climbing stairs? 1 0  Comment L ankle sprain. Wearing UNA BOOT for support. Followed by Ortho.   Dressing or bathing? 0 0  Doing errands, shopping? 0 0  Preparing Food and eating ? N   Using the Toilet? N   In the past six months, have you accidently leaked urine? N   Do you have problems with loss of bowel control? N   Managing your Medications? N   Managing your Finances? N   Housekeeping or managing your Housekeeping? N     Patient Care Team: Crecencio Mc, MD as PCP - General (Internal Medicine) Crecencio Mc, MD (Internal Medicine)  Indicate any recent Medical Services you may have received from other than [facility] providers in the past year (date may be approximate).     Assessment:   This is a routine wellness examination for Sharnell.  I connected with  Hamda Klutts on 09/24/22 by a audio enabled telemedicine application and verified that I am speaking with the correct person using two identifiers.  Patient Location: Home  Provider Location: Office/Clinic  I discussed the limitations of evaluation and management by telemedicine. The patient expressed understanding and agreed to proceed.   Hearing/Vision screen Hearing Screening - Comments:: Patient is able to  hear conversational tones without difficulty.  No issues reported.   Vision Screening - Comments:: Followed by Dr. Ellin Mayhew Wears corrective lenses They have seen their ophthalmologist in the last 12 months.    Dietary issues and exercise activities discussed: Current Exercise Habits: Home exercise routine Regular diet Good fluid intake   Goals Addressed             This Visit's Progress    Maintain healthy lifestyle         Depression Screen    09/24/2022    2:41 PM 01/30/2022    2:45 PM 10/17/2021    1:32 PM 03/13/2021    9:20 AM 06/16/2020   11:38 AM 10/15/2017    9:17 PM 10/15/2016    3:43 PM  PHQ 2/9 Scores  PHQ - 2 Score 0 '3 3 2 '$ 0 2 0  PHQ- 9  Score  '10 10 10 6 7     '$ Fall Risk    09/24/2022    2:45 PM 01/30/2022    2:45 PM 10/17/2021    1:29 PM 03/14/2021   12:15 PM 03/10/2021    4:20 PM  Fall Risk   Falls in the past year? 0 0 1 1   Number falls in past yr: 0  0 1   Injury with Fall? 0  1 0   Risk for fall due to : No Fall Risks No Fall Risks History of fall(s)    Follow up Falls evaluation completed Falls evaluation completed Falls evaluation completed Falls evaluation completed Falls evaluation completed    [place]: Home free of loose throw rugs in walkways, pet beds, electrical cords, etc? Yes  Adequate lighting in your home to reduce risk of falls? Yes   ASSISTIVE DEVICES UTILIZED TO PREVENT FALLS: Life alert? No  Use of a cane, walker or w/c? No  Grab bars in the bathroom? Yes Shower chair or bench in shower? No  Elevated toilet seat or a handicapped toilet? No   TIMED UP AND GO: Was the test performed? No .   Cognitive Function: Patient is alert and oriented x3.  Denies difficulty focusing, making decisions, memory loss.  100% independent.         09/24/2022    2:45 PM  6CIT Screen  Months in reverse 0 points    Immunizations Immunization History  Administered Date(s) Administered   Influenza Split 09/11/2012   Influenza,inj,Quad PF,6+ Mos 10/09/2013, 10/03/2015, 10/15/2016, 10/14/2017, 10/22/2018, 10/26/2019   Influenza-Unspecified 11/18/2021   PFIZER(Purple Top)SARS-COV-2 Vaccination 01/25/2020, 02/15/2020, 09/07/2020   PNEUMOCOCCAL CONJUGATE-20 10/30/2021   Tdap 04/15/2013   Flu Vaccine status: Due, Education has been provided regarding the importance of this vaccine. Advised may receive this vaccine at local pharmacy or Health Dept. Aware to provide a copy of the vaccination record if obtained from local pharmacy or Health Dept. Verbalized acceptance and understanding.Deferred.   Covid-19 vaccine status: Completed vaccines x3.   Shingrix Completed?: No.     Education has been provided regarding the importance of this vaccine. Patient has been advised to call insurance company to determine out of pocket expense if they have not yet received this vaccine. Advised may also receive vaccine at local pharmacy or Health Dept. Verbalized acceptance and understanding.  Screening Tests Health Maintenance  Topic Date Due   COVID-19 Vaccine (4 - Pfizer series) 10/10/2022 (Originally 11/02/2020)   Zoster Vaccines- Shingrix (1 of 2) 12/25/2022 (Originally 11/12/2005)   INFLUENZA VACCINE  03/10/2023 (Originally 07/10/2022)  MAMMOGRAM  02/09/2023   COLONOSCOPY (Pts 45-6yr Insurance coverage will need to be confirmed)  03/27/2023   TETANUS/TDAP  04/16/2023   Pneumonia Vaccine 67 Years old  Completed   DEXA SCAN  Completed   Hepatitis C Screening  Completed   HPV VACCINES  Aged Out    Health Maintenance There are no preventive care reminders to display for this patient.  Lung Cancer Screening: (Low Dose CT Chest recommended if Age 67-80years, 30 pack-year currently smoking OR have quit w/in 15years.) does not qualify.   Vision Screening: Recommended annual ophthalmology exams for early detection of glaucoma and other disorders of the eye.  Dental Screening: Recommended annual dental exams for proper oral hygiene  Community Resource Referral / Chronic Care Management: CRR required this visit?  No   CCM required this visit?  No      Plan:   Recent left ankle roll while at home. No fall. Followed by [facility]. UNA boot in use.   [facility] called patient back to offer lab appointment prior to scheduled cpe, no answer. Left message to call office back as needed for lab scheduling request. Keep all routine scheduled appointments.   I have personally reviewed and noted the following in the patient's chart:   Medical and social history Use of alcohol, tobacco or illicit drugs  Current medications and supplements including opioid prescriptions. Patient is  not currently taking opioid prescriptions. Functional ability and status Nutritional status Physical activity Advanced directives List of other physicians Hospitalizations, surgeries, and ER visits in previous 12 months Vitals Screenings to include cognitive, depression, and falls Referrals and appointments  In addition, I have reviewed and discussed with patient certain preventive protocols, quality metrics, and best practice recommendations. A written personalized care plan for preventive services as well as general preventive health recommendations were provided to patient.     OBrien-Blaney, Aaniya Sterba L, LPN   [id number]       I have reviewed the above information and agree with above.   TDeborra Medina MD

## 2022-09-24 NOTE — Patient Instructions (Addendum)
Katrina Woods , Thank you for taking time to come for your Medicare Wellness Visit. I appreciate your ongoing commitment to your health goals. Please review the following plan we discussed and let me know if I can assist you in the future.   These are the goals we discussed:  Goals      Maintain healthy lifestyle        This is a list of the screening recommended for you and due dates:  Health Maintenance  Topic Date Due   COVID-19 Vaccine (4 - Pfizer series) 10/10/2022*   Zoster (Shingles) Vaccine (1 of 2) 12/25/2022*   Flu Shot  03/10/2023*   Mammogram  02/09/2023   Colon Cancer Screening  03/27/2023   Tetanus Vaccine  04/16/2023   Pneumonia Vaccine  Completed   DEXA scan (bone density measurement)  Completed   Hepatitis C Screening: USPSTF Recommendation to screen - Ages 11-79 yo.  Completed   HPV Vaccine  Aged Out  *Topic was postponed. The date shown is not the original due date.    Advanced directives: End of life planning; Advance aging; Advanced directives discussed.  Copy of current HCPOA/Living Will requested.    Conditions/risks identified: none new  Next appointment: Follow up in one year for your annual wellness visit    Preventive Care 65 Years and Older, Female Preventive care refers to lifestyle choices and visits with your health care provider that can promote health and wellness. What does preventive care include? A yearly physical exam. This is also called an annual well check. Dental exams once or twice a year. Routine eye exams. Ask your health care provider how often you should have your eyes checked. Personal lifestyle choices, including: Daily care of your teeth and gums. Regular physical activity. Eating a healthy diet. Avoiding tobacco and drug use. Limiting alcohol use. Practicing safe sex. Taking low-dose aspirin every day. Taking vitamin and mineral supplements as recommended by your health care provider. What happens during an annual well  check? The services and screenings done by your health care provider during your annual well check will depend on your age, overall health, lifestyle risk factors, and family history of disease. Counseling  Your health care provider may ask you questions about your: Alcohol use. Tobacco use. Drug use. Emotional well-being. Home and relationship well-being. Sexual activity. Eating habits. History of falls. Memory and ability to understand (cognition). Work and work Statistician. Reproductive health. Screening  You may have the following tests or measurements: Height, weight, and BMI. Blood pressure. Lipid and cholesterol levels. These may be checked every 5 years, or more frequently if you are over 4 years old. Skin check. Lung cancer screening. You may have this screening every year starting at age 9 if you have a 30-pack-year history of smoking and currently smoke or have quit within the past 15 years. Fecal occult blood test (FOBT) of the stool. You may have this test every year starting at age 82. Flexible sigmoidoscopy or colonoscopy. You may have a sigmoidoscopy every 5 years or a colonoscopy every 10 years starting at age 71. Hepatitis C blood test. Hepatitis B blood test. Sexually transmitted disease (STD) testing. Diabetes screening. This is done by checking your blood sugar (glucose) after you have not eaten for a while (fasting). You may have this done every 1-3 years. Bone density scan. This is done to screen for osteoporosis. You may have this done starting at age 34. Mammogram. This may be done every 1-2 years. Talk to  your health care provider about how often you should have regular mammograms. Talk with your health care provider about your test results, treatment options, and if necessary, the need for more tests. Vaccines  Your health care provider may recommend certain vaccines, such as: Influenza vaccine. This is recommended every year. Tetanus, diphtheria, and  acellular pertussis (Tdap, Td) vaccine. You may need a Td booster every 10 years. Zoster vaccine. You may need this after age 103. Pneumococcal 13-valent conjugate (PCV13) vaccine. One dose is recommended after age 45. Pneumococcal polysaccharide (PPSV23) vaccine. One dose is recommended after age 62. Talk to your health care provider about which screenings and vaccines you need and how often you need them. This information is not intended to replace advice given to you by your health care provider. Make sure you discuss any questions you have with your health care provider. Document Released: 12/23/2015 Document Revised: 08/15/2016 Document Reviewed: 09/27/2015 Elsevier Interactive Patient Education  2017 [place] Prevention in the Home Falls can cause injuries. They can happen to people of all ages. There are many things you can do to make your home safe and to help prevent falls. What can I do on the outside of my home? Regularly fix the edges of walkways and driveways and fix any cracks. Remove anything that might make you trip as you walk through a door, such as a raised step or threshold. Trim any bushes or trees on the path to your home. Use bright outdoor lighting. Clear any walking paths of anything that might make someone trip, such as rocks or tools. Regularly check to see if handrails are loose or broken. Make sure that both sides of any steps have handrails. Any raised decks and porches should have guardrails on the edges. Have any leaves, snow, or ice cleared regularly. Use sand or salt on walking paths during winter. Clean up any spills in your garage right away. This includes oil or grease spills. What can I do in the bathroom? Use night lights. Install grab bars by the toilet and in the tub and shower. Do not use towel bars as grab bars. Use non-skid mats or decals in the tub or shower. If you need to sit down in the shower, use a plastic, non-slip stool. Keep  the floor dry. Clean up any water that spills on the floor as soon as it happens. Remove soap buildup in the tub or shower regularly. Attach bath mats securely with double-sided non-slip rug tape. Do not have throw rugs and other things on the floor that can make you trip. What can I do in the bedroom? Use night lights. Make sure that you have a light by your bed that is easy to reach. Do not use any sheets or blankets that are too big for your bed. They should not hang down onto the floor. Have a firm chair that has side arms. You can use this for support while you get dressed. Do not have throw rugs and other things on the floor that can make you trip. What can I do in the kitchen? Clean up any spills right away. Avoid walking on wet floors. Keep items that you use a lot in easy-to-reach places. If you need to reach something above you, use a strong step stool that has a grab bar. Keep electrical cords out of the way. Do not use floor polish or wax that makes floors slippery. If you must use wax, use non-skid floor wax. Do not  have throw rugs and other things on the floor that can make you trip. What can I do with my stairs? Do not leave any items on the stairs. Make sure that there are handrails on both sides of the stairs and use them. Fix handrails that are broken or loose. Make sure that handrails are as long as the stairways. Check any carpeting to make sure that it is firmly attached to the stairs. Fix any carpet that is loose or worn. Avoid having throw rugs at the top or bottom of the stairs. If you do have throw rugs, attach them to the floor with carpet tape. Make sure that you have a light switch at the top of the stairs and the bottom of the stairs. If you do not have them, ask someone to add them for you. What else can I do to help prevent falls? Wear shoes that: Do not have high heels. Have rubber bottoms. Are comfortable and fit you well. Are closed at the toe. Do not  wear sandals. If you use a stepladder: Make sure that it is fully opened. Do not climb a closed stepladder. Make sure that both sides of the stepladder are locked into place. Ask someone to hold it for you, if possible. Clearly mark and make sure that you can see: Any grab bars or handrails. First and last steps. Where the edge of each step is. Use tools that help you move around (mobility aids) if they are needed. These include: Canes. Walkers. Scooters. Crutches. Turn on the lights when you go into a dark area. Replace any light bulbs as soon as they burn out. Set up your furniture so you have a clear path. Avoid moving your furniture around. If any of your floors are uneven, fix them. If there are any pets around you, be aware of where they are. Review your medicines with your doctor. Some medicines can make you feel dizzy. This can increase your chance of falling. Ask your doctor what other things that you can do to help prevent falls. This information is not intended to replace advice given to you by your health care provider. Make sure you discuss any questions you have with your health care provider. Document Released: 09/22/2009 Document Revised: 05/03/2016 Document Reviewed: 12/31/2014 Elsevier Interactive Patient Education  2017 [employer].

## 2022-10-18 ENCOUNTER — Other Ambulatory Visit: Payer: Self-pay | Admitting: Internal Medicine

## 2022-11-05 ENCOUNTER — Encounter: Payer: Self-pay | Admitting: Internal Medicine

## 2022-11-05 ENCOUNTER — Ambulatory Visit (INDEPENDENT_AMBULATORY_CARE_PROVIDER_SITE_OTHER): Payer: PPO | Admitting: Internal Medicine

## 2022-11-05 VITALS — BP 112/70 | HR 54 | Temp 98.6°F | Ht 62.0 in | Wt 123.0 lb

## 2022-11-05 DIAGNOSIS — D649 Anemia, unspecified: Secondary | ICD-10-CM | POA: Diagnosis not present

## 2022-11-05 DIAGNOSIS — M130 Polyarthritis, unspecified: Secondary | ICD-10-CM

## 2022-11-05 DIAGNOSIS — E559 Vitamin D deficiency, unspecified: Secondary | ICD-10-CM | POA: Diagnosis not present

## 2022-11-05 DIAGNOSIS — I700 Atherosclerosis of aorta: Secondary | ICD-10-CM | POA: Diagnosis not present

## 2022-11-05 DIAGNOSIS — Z23 Encounter for immunization: Secondary | ICD-10-CM

## 2022-11-05 DIAGNOSIS — E538 Deficiency of other specified B group vitamins: Secondary | ICD-10-CM | POA: Diagnosis not present

## 2022-11-05 DIAGNOSIS — H9191 Unspecified hearing loss, right ear: Secondary | ICD-10-CM

## 2022-11-05 DIAGNOSIS — I671 Cerebral aneurysm, nonruptured: Secondary | ICD-10-CM

## 2022-11-05 DIAGNOSIS — E785 Hyperlipidemia, unspecified: Secondary | ICD-10-CM

## 2022-11-05 DIAGNOSIS — Z0000 Encounter for general adult medical examination without abnormal findings: Secondary | ICD-10-CM | POA: Diagnosis not present

## 2022-11-05 DIAGNOSIS — R634 Abnormal weight loss: Secondary | ICD-10-CM | POA: Diagnosis not present

## 2022-11-05 DIAGNOSIS — F418 Other specified anxiety disorders: Secondary | ICD-10-CM

## 2022-11-05 DIAGNOSIS — M5387 Other specified dorsopathies, lumbosacral region: Secondary | ICD-10-CM

## 2022-11-05 LAB — LIPID PANEL
Cholesterol: 208 mg/dL — ABNORMAL HIGH (ref 0–200)
HDL: 66.2 mg/dL (ref >39.00)
LDL Cholesterol: 121 mg/dL — ABNORMAL HIGH (ref 0–99)
NonHDL: 141.57
Total CHOL/HDL Ratio: 3
Triglycerides: 102 mg/dL (ref 0.0–149.0)
VLDL: 20.4 mg/dL (ref 0.0–40.0)

## 2022-11-05 LAB — CBC WITH DIFFERENTIAL/PLATELET
Basophils Absolute: 0.1 10*3/uL (ref 0.0–0.1)
Basophils Relative: 1.1 % (ref 0.0–3.0)
Eosinophils Absolute: 0.1 10*3/uL (ref 0.0–0.7)
Eosinophils Relative: 1.5 % (ref 0.0–5.0)
HCT: 43.7 % (ref 36.0–46.0)
Hemoglobin: 14.6 g/dL (ref 12.0–15.0)
Lymphocytes Relative: 21.9 % (ref 12.0–46.0)
Lymphs Abs: 1.8 10*3/uL (ref 0.7–4.0)
MCHC: 33.4 g/dL (ref 30.0–36.0)
MCV: 90.6 fl (ref 78.0–100.0)
Monocytes Absolute: 0.6 10*3/uL (ref 0.1–1.0)
Monocytes Relative: 6.9 % (ref 3.0–12.0)
Neutro Abs: 5.7 10*3/uL (ref 1.4–7.7)
Neutrophils Relative %: 68.6 % (ref 43.0–77.0)
Platelets: 279 10*3/uL (ref 150.0–400.0)
RBC: 4.82 Mil/uL (ref 3.87–5.11)
RDW: 15.9 % — ABNORMAL HIGH (ref 11.5–15.5)
WBC: 8.4 10*3/uL (ref 4.0–10.5)

## 2022-11-05 LAB — TSH: TSH: 3.92 u[IU]/mL (ref 0.35–5.50)

## 2022-11-05 LAB — COMPREHENSIVE METABOLIC PANEL
ALT: 6 U/L (ref 0–35)
AST: 15 U/L (ref 0–37)
Albumin: 4.1 g/dL (ref 3.5–5.2)
Alkaline Phosphatase: 58 U/L (ref 39–117)
BUN: 13 mg/dL (ref 6–23)
CO2: 26 mEq/L (ref 19–32)
Calcium: 9.8 mg/dL (ref 8.4–10.5)
Chloride: 100 mEq/L (ref 96–112)
Creatinine, Ser: 0.86 mg/dL (ref 0.40–1.20)
GFR: 70.12 mL/min (ref >60.00)
Glucose, Bld: 98 mg/dL (ref 70–99)
Potassium: 4.5 mEq/L (ref 3.5–5.1)
Sodium: 136 mEq/L (ref 135–145)
Total Bilirubin: 0.4 mg/dL (ref 0.2–1.2)
Total Protein: 6.8 g/dL (ref 6.0–8.3)

## 2022-11-05 LAB — IBC + FERRITIN
Ferritin: 23.1 ng/mL (ref 10.0–291.0)
Iron: 71 ug/dL (ref 42–145)
Saturation Ratios: 20.3 % (ref 20.0–50.0)
TIBC: 350 ug/dL (ref 250.0–450.0)
Transferrin: 250 mg/dL (ref 212.0–360.0)

## 2022-11-05 LAB — VITAMIN D 25 HYDROXY (VIT D DEFICIENCY, FRACTURES): VITD: 41.74 ng/mL (ref 30.00–100.00)

## 2022-11-05 LAB — HEMOGLOBIN A1C: Hgb A1c MFr Bld: 5.8 % (ref 4.6–6.5)

## 2022-11-05 LAB — B12 AND FOLATE PANEL
Folate: 9.7 ng/mL (ref >5.9)
Vitamin B-12: 231 pg/mL (ref 211–911)

## 2022-11-05 MED ORDER — SERTRALINE HCL 100 MG PO TABS: 100.0000 mg | ORAL_TABLET | Freq: Every day | ORAL | 3 refills | Status: DC

## 2022-11-05 MED ORDER — TRAMADOL HCL 50 MG PO TABS: 50.0000 mg | ORAL_TABLET | Freq: Four times a day (QID) | ORAL | 0 refills | Status: DC | PRN

## 2022-11-05 NOTE — Assessment & Plan Note (Signed)
Managed with tylenol and tramadol, added today.  Stop celebrex

## 2022-11-05 NOTE — Assessment & Plan Note (Addendum)
Resolved,  but b12 deficient.  Given use of celebrex twice daily and Brilinta,  she was  warned to stop using celebrex due to risk of GI bleed

## 2022-11-05 NOTE — Assessment & Plan Note (Signed)
Reviewed findings of prior CT scan today..  Patient has a history of statin myalgias,  But  is tolerating Zetia given  her concurrent tobacco abuse.

## 2022-11-05 NOTE — Patient Instructions (Addendum)
You should not be taking celebrex daily if you are taking a blood thinner like Brilinta because your risk of GI bleed is dramatically increased   You can add up to 2000 mg of acetominophen (tylenol) every day safely  In divided doses (500 mg every 6 hours  Or 1000 mg every 12 hours.)   I will add a prescription for tramadol to use not more than twice daily .  The initial prescription needs to last you a month   Referral for hearing test is in progress

## 2022-11-05 NOTE — Assessment & Plan Note (Signed)
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

## 2022-11-05 NOTE — Assessment & Plan Note (Signed)

## 2022-11-05 NOTE — Assessment & Plan Note (Signed)
Advised to stop celebrex given concurrent use of Brilinta

## 2022-11-05 NOTE — Progress Notes (Signed)
Patient ID: Katrina Woods, female    DOB: [birth year]  Age: 66 y.o. MRN: [id number]  The patient is here for annual preventive examination and management of other chronic and acute problems.   The risk factors are reflected in the social history.  The roster of all physicians providing medical care to patient - is listed in the Snapshot section of the chart.  Activities of daily living:  The patient is 100% independent in all ADLs: dressing, toileting, feeding as well as independent mobility  Home safety : The patient has smoke detectors in the home. They wear seatbelts.  There are no firearms at home. There is no violence in the home.   There is no risks for hepatitis, STDs or HIV. There is no   history of blood transfusion. They have no travel history to infectious disease endemic areas of the world.  The patient has seen their dentist in the last six month. They have seen their eye doctor in the last year. They admit to slight hearing difficulty with regard to whispered voices and some television programs.  They have deferred audiologic testing in the last year.  They do not  have excessive sun exposure. Discussed the need for sun protection: hats, long sleeves and use of sunscreen if there is significant sun exposure.   Diet: the importance of a healthy diet is discussed. They do have a healthy diet.  The benefits of regular aerobic exercise were discussed. She walks 5 times per week ,  60 minutes.   Depression screen: there are no signs or vegative symptoms of depression- irritability, change in appetite, anhedonia, sadness/tearfullness.  Cognitive assessment: the patient manages all their financial and personal affairs and is actively engaged. They could relate day,date,year and events; recalled 2/3 objects at 3 minutes; performed clock-face test normally.  The following portions of the patient's history were reviewed and updated as appropriate: allergies, current medications,  past family history, past medical history,  past surgical history, past social history  and problem list.  Visual acuity was not assessed per patient preference since she has regular follow up with her ophthalmologist. Hearing and body mass index were assessed and reviewed.   During the course of the visit the patient was educated and counseled about appropriate screening and preventive services including : fall prevention , diabetes screening, nutrition counseling, colorectal cancer screening, and recommended immunizations.    CC: The primary encounter diagnosis was Anemia, unspecified type. 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  1) depression: complicated by grief.  Lost 3 dear friends in the last 6 months. she has developed increased compulsivity manifested in nail biting.   2) right lateral foot pain secondary to an injury when she rolled her ankle  at the end of summer.  Saw [facility] in October.  No fractures.  Wore a boot for a while. No better,    3) neuropathy:  describes a numb area on plantar surface of both feet under the toes   3) has chronic low back pain with left sided radiculopathy  to the knee.    3) taking celebrex bid along with brilinta  for several months   5)  history of cerebral aneurysm.   New one  was found,  stent placed during  procedure in Jan 2023.  Taking 1/2 brilinta twice daily      history Muriel has a past medical history of Allergy, Aneurysm ([place]), Back pain, Bursitis of left hip (07/2012), Depression, GERD (gastroesophageal reflux disease), Headache, Hiatal hernia (01/2008), History of  bronchitis (2015), History of colon polyps, History of kidney stones, History of shingles, Hyperlipidemia, Insomnia, Sleep apnea, and Weakness.   She has a past surgical history that includes Abdominal hysterectomy (2004); Knee cartilage surgery (Left, 1987); Cesarean section (11/25/1982); Wisdom tooth extraction (1986); Cerebral aneurysm repair (2006); Colonoscopy; Esophagogastroduodenoscopy; Radiology with anesthesia (N/A, 05/11/2015); ir generic historical (10/23/2016); Polypectomy; IR Radiologist Eval & Mgmt (09/18/2021); Dilation and curettage of uterus; Radiology with anesthesia (N/A, 11/23/2021); IR Transcath/Emboliz (11/23/2021); IR 3D Independent Wkst (11/23/2021); IR US Guide Vasc Access Right (11/23/2021); IR ANGIO VERTEBRAL SEL VERTEBRAL UNI R MOD SED (11/23/2021); Sombrillo (11/23/2021); and IR Radiologist Eval & Mgmt (12/18/2021).   Her family history includes Appendicitis in her father; Breast cancer in her maternal grandmother; Cancer in her father, maternal grandmother, and mother; Diabetes in her maternal grandmother and mother; Diabetes (age of onset: 29) in her brother; Heart block in her paternal grandfather; Heart disease in her maternal grandmother and mother; Hypertension in her brother, maternal grandmother, and mother; Stroke in her maternal grandfather.She reports that she has quit smoking. Her smoking use included cigarettes. She has never used smokeless tobacco. She reports current alcohol use of about 3.0 standard drinks of alcohol per week. She reports that she does not use drugs.  Outpatient Medications Prior to Visit  Medication Sig Dispense Refill   acetaminophen (TYLENOL) 325 MG tablet Take 650 mg by mouth every 6 (six) hours as needed for moderate pain or headache.     ALPRAZolam (XANAX) 0.5 MG tablet TAKE 1 TABLET BY MOUTH AT BEDTIME AS NEEDED FOR ANXIETY OR SLEEP 30 tablet 2   aspirin EC 81 MG tablet Take 81 mg by mouth daily. Swallow whole.     celecoxib  (CELEBREX) 200 MG capsule TAKE 1 CAPSULE BY MOUTH TWICE A DAY 60 capsule 2   ezetimibe (ZETIA) 10 MG tablet TAKE 1 TABLET BY MOUTH EVERY DAY 90 tablet 3   lansoprazole (PREVACID) 30 MG capsule TAKE 1 CAPSULE (30 MG TOTAL) BY MOUTH 2 (TWO) TIMES DAILY BEFORE A MEAL. 60 capsule 2   ticagrelor (BRILINTA) 90 MG TABS tablet Take 1 tablet (90 mg total) by mouth 2 (two) times daily. 60 tablet 6   tiZANidine (ZANAFLEX) 4 MG tablet Take 1 tablet (4 mg total) by mouth every 6 (six) hours as needed for muscle spasms. 30 tablet 0   escitalopram (LEXAPRO) 20 MG tablet TAKE 1 TABLET BY MOUTH EVERY DAY 90 tablet 0   phenazopyridine (PYRIDIUM) 200 MG tablet Take 1 tablet (200 mg total) by mouth 3 (three) times daily as needed for pain. 6 tablet 0   Facility-Administered Medications Prior to Visit  Medication Dose Route Frequency Provider Last Rate Last Admin   aspirin tablet 325 mg  325 mg Oral Once Boisseau, Hayley, PA        Review of Systems  Patient denies headache, fevers, malaise, unintentional weight loss, skin rash, eye pain, sinus congestion and sinus pain, sore throat, dysphagia,  hemoptysis , cough, dyspnea, wheezing, chest pain, palpitations, orthopnea, edema, abdominal pain, nausea, melena, diarrhea, constipation, flank pain, dysuria, hematuria, urinary  Frequency, nocturia, numbness, tingling, seizures,  Focal weakness, Loss of consciousness,  Tremor, insomnia, depression,  anxiety, and suicidal ideation.    Objective:  BP 112/70 (BP Location: Left Arm, Patient Position: Sitting, Cuff Size: Normal)   Pulse (!) 54   Temp 98.6 F (37 C) (Oral)   Ht '5\' 2"'$  (1.575 m)   Wt 123 lb (55.8 kg)   SpO2 98%   BMI 22.50 kg/m   Physical Exam   General appearance: alert, cooperative and appears stated age Head: Normocephalic, without obvious abnormality, atraumatic Eyes: conjunctivae/corneas clear. PERRL, EOM's intact. Fundi benign. Ears: normal TM's and external ear canals both ears Nose: Nares  normal. Septum midline. Mucosa normal. No drainage or sinus tenderness. Throat: lips, mucosa, and tongue normal; teeth and gums normal Neck: no adenopathy, no carotid bruit, no JVD, supple, symmetrical, trachea midline and thyroid not enlarged, symmetric, no tenderness/mass/nodules Lungs: clear to auscultation bilaterally Breasts: normal appearance, no masses or tenderness Heart: regular rate and rhythm, S1, S2 normal, no murmur, click, rub or gallop Abdomen: soft, non-tender; bowel sounds normal; no masses,  no organomegaly Extremities: extremities normal, atraumatic, no cyanosis or edema Pulses: 2+ and symmetric Skin: Skin color, texture, turgor normal. No rashes or lesions Neurologic: Alert and oriented X 3, normal strength and tone. Normal symmetric reflexes. Normal coordination and gait.    Assessment & Plan:   Problem List Items Addressed This Visit     Anemia, unspecified - Primary    Given use of celebrex twice daily ad bilateral neuropathy,  screening for iron, B12, folate, DM and thyroid dysfunction .  Slo warned to stop using celebrex due to risk of GI bleed       Relevant Orders   CBC with Differential/Platelet (Completed)   IBC + Ferritin (Completed)   B12 and Folate Panel (Completed)   Aneurysm, cerebral, nonruptured    ENDOVASCULAR EMBOLIZATION OF NEW SMALL BASILAR APEX ANEURYSM USING LVIS JR BRAIDED STENT  December 2022      Aortic atherosclerosis [facility])    Reviewed findings of prior CT scan today..  Patient has a history of statin myalgias,  But  is tolerating Zetia given  her concurrent tobacco abuse.       Depression with anxiety    Changing from lexapro to sertraline 100 mg for management of compulsivity (nail biting)      Relevant Medications   sertraline (ZOLOFT) 100 MG tablet   Encounter for preventive health examination    age appropriate education and counseling updated, referrals for preventative services and immunizations addressed, dietary and smoking  counseling addressed, most recent labs reviewed.  I have personally reviewed and have noted:   1) the patient's medical and social history 2) The pt's use of alcohol, tobacco, and illicit drugs 3) The patient's current medications and supplements 4) Functional ability including ADL's, fall risk, home safety risk, hearing and visual impairment 5) Diet and physical activities 6) Evidence for depression or mood disorder 7) The patient's height, weight, and BMI have been recorded in the chart  I have made referrals, and provided counseling and education based on review of the above       Hyperlipidemia LDL goal <100   Relevant Orders   Comprehensive metabolic panel (Completed)   Lipid panel (Completed)   Polyarthritis    Advised to stop celebrex given concurrent use of Brilinta       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  Stop celebrex      Relevant Medications   sertraline (ZOLOFT)  100 MG tablet   Weight loss   Relevant Orders   Hemoglobin A1c (Completed)   TSH (Completed)   Other Visit Diagnoses     Vitamin D deficiency       Relevant Orders   VITAMIN D 25 Hydroxy (Vit-D Deficiency, Fractures) (Completed)   Decreased hearing of right ear       Relevant Orders   Ambulatory referral to Audiology   Need for immunization against influenza       Relevant Orders   Flu Vaccine QUAD High Dose(Fluad) (Completed)       I have discontinued Verne Carrow. Tsuchiya's phenazopyridine and escitalopram. I am also having her start on sertraline and traMADol. Additionally, I am having her maintain her ALPRAZolam, aspirin EC, acetaminophen, tiZANidine, ezetimibe, ticagrelor, celecoxib, and lansoprazole.    I provided 40 minutes of  face-to-face time during this encounter reviewing patient's current problems and past surgeries,  recent labs and imaging studies, providing counseling on the above mentioned problems ,  and coordination  of care .   Follow-up: Return in about 3 months (around 02/05/2023).   Crecencio Mc, MD

## 2022-11-05 NOTE — Assessment & Plan Note (Addendum)
Changing from lexapro to sertraline 100 mg for management of compulsivity (nail biting)

## 2022-11-06 DIAGNOSIS — E538 Deficiency of other specified B group vitamins: Secondary | ICD-10-CM | POA: Insufficient documentation

## 2022-11-06 NOTE — Addendum Note (Signed)
Addended by: Crecencio Mc on: 11/06/2022 12:37 PM   Modules accepted: Orders

## 2022-11-13 ENCOUNTER — Ambulatory Visit (INDEPENDENT_AMBULATORY_CARE_PROVIDER_SITE_OTHER): Payer: PPO

## 2022-11-13 DIAGNOSIS — E538 Deficiency of other specified B group vitamins: Secondary | ICD-10-CM | POA: Diagnosis not present

## 2022-11-13 MED ORDER — CYANOCOBALAMIN 1000 MCG/ML IJ SOLN
1000.0000 ug | Freq: Once | INTRAMUSCULAR | Status: AC
  Administered 2022-11-13: 1000 ug via INTRAMUSCULAR

## 2022-11-13 NOTE — Progress Notes (Signed)
Pt presented for their vitamin B12 injection. Pt was identified through two identifiers. Pt tolerated shot well in their right deltoid.  

## 2022-11-20 ENCOUNTER — Ambulatory Visit (INDEPENDENT_AMBULATORY_CARE_PROVIDER_SITE_OTHER): Payer: PPO

## 2022-11-20 ENCOUNTER — Ambulatory Visit: Payer: PPO

## 2022-11-20 DIAGNOSIS — E538 Deficiency of other specified B group vitamins: Secondary | ICD-10-CM

## 2022-11-20 MED ORDER — CYANOCOBALAMIN 1000 MCG/ML IJ SOLN
1000.0000 ug | Freq: Once | INTRAMUSCULAR | Status: AC
  Administered 2022-11-20: 1000 ug via INTRAMUSCULAR

## 2022-11-20 NOTE — Progress Notes (Signed)
Pt presented for their vitamin B12 injection. Pt was identified through two identifiers. Pt tolerated shot well in their left  deltoid.  

## 2022-11-21 NOTE — Telephone Encounter (Signed)
MyChart messgae sent to patient. 

## 2022-11-22 LAB — METHYLMALONIC ACID, SERUM: Methylmalonic Acid, Quant: 175 nmol/L (ref 87–318)

## 2022-11-22 LAB — INTRINSIC FACTOR ANTIBODIES: Intrinsic Factor: POSITIVE — AB

## 2022-11-27 ENCOUNTER — Ambulatory Visit (INDEPENDENT_AMBULATORY_CARE_PROVIDER_SITE_OTHER): Payer: PPO

## 2022-11-27 DIAGNOSIS — E538 Deficiency of other specified B group vitamins: Secondary | ICD-10-CM | POA: Diagnosis not present

## 2022-11-27 MED ORDER — CYANOCOBALAMIN 1000 MCG/ML IJ SOLN
1000.0000 ug | Freq: Once | INTRAMUSCULAR | Status: AC
  Administered 2022-11-27: 1000 ug via INTRAMUSCULAR

## 2022-11-27 NOTE — Progress Notes (Signed)
Pt presented for their vitamin B12 injection. Pt was identified through two identifiers. Pt tolerated shot well in their left  deltoid.  

## 2022-12-04 ENCOUNTER — Ambulatory Visit: Payer: PPO

## 2022-12-07 ENCOUNTER — Encounter: Payer: Self-pay | Admitting: Internal Medicine

## 2022-12-21 ENCOUNTER — Other Ambulatory Visit: Payer: Self-pay | Admitting: Internal Medicine

## 2022-12-21 DIAGNOSIS — F418 Other specified anxiety disorders: Secondary | ICD-10-CM

## 2022-12-21 MED ORDER — ESCITALOPRAM OXALATE 20 MG PO TABS: 20.0000 mg | ORAL_TABLET | Freq: Every day | ORAL | 1 refills | Status: DC

## 2022-12-21 NOTE — Progress Notes (Signed)
Lexapro 20 mg  sent to pharmacy and sertraline discontinued per patient request

## 2022-12-21 NOTE — Assessment & Plan Note (Signed)
Lexapro 20 mg  sent to pharmacy and sertraline discontinued per patient request

## 2022-12-28 DIAGNOSIS — H40013 Open angle with borderline findings, low risk, bilateral: Secondary | ICD-10-CM | POA: Diagnosis not present

## 2022-12-28 DIAGNOSIS — H2513 Age-related nuclear cataract, bilateral: Secondary | ICD-10-CM | POA: Diagnosis not present

## 2022-12-28 DIAGNOSIS — H43393 Other vitreous opacities, bilateral: Secondary | ICD-10-CM | POA: Diagnosis not present

## 2022-12-28 DIAGNOSIS — H5203 Hypermetropia, bilateral: Secondary | ICD-10-CM | POA: Diagnosis not present

## 2023-02-06 ENCOUNTER — Encounter: Payer: Self-pay | Admitting: Internal Medicine

## 2023-02-06 ENCOUNTER — Ambulatory Visit (INDEPENDENT_AMBULATORY_CARE_PROVIDER_SITE_OTHER): Payer: PPO | Admitting: Internal Medicine

## 2023-02-06 VITALS — BP 134/78 | HR 52 | Temp 98.0°F | Resp 14 | Ht 62.0 in | Wt 117.4 lb

## 2023-02-06 DIAGNOSIS — E785 Hyperlipidemia, unspecified: Secondary | ICD-10-CM | POA: Diagnosis not present

## 2023-02-06 DIAGNOSIS — E538 Deficiency of other specified B group vitamins: Secondary | ICD-10-CM | POA: Diagnosis not present

## 2023-02-06 DIAGNOSIS — T466X5A Adverse effect of antihyperlipidemic and antiarteriosclerotic drugs, initial encounter: Secondary | ICD-10-CM | POA: Diagnosis not present

## 2023-02-06 DIAGNOSIS — M542 Cervicalgia: Secondary | ICD-10-CM

## 2023-02-06 DIAGNOSIS — D126 Benign neoplasm of colon, unspecified: Secondary | ICD-10-CM

## 2023-02-06 DIAGNOSIS — M791 Myalgia, unspecified site: Secondary | ICD-10-CM | POA: Diagnosis not present

## 2023-02-06 DIAGNOSIS — F418 Other specified anxiety disorders: Secondary | ICD-10-CM

## 2023-02-06 DIAGNOSIS — Z1231 Encounter for screening mammogram for malignant neoplasm of breast: Secondary | ICD-10-CM

## 2023-02-06 MED ORDER — TRAMADOL HCL 50 MG PO TABS: 50.0000 mg | ORAL_TABLET | Freq: Two times a day (BID) | ORAL | 5 refills | Status: DC | PRN

## 2023-02-06 MED ORDER — CYANOCOBALAMIN 1000 MCG/ML IJ SOLN
1000.0000 ug | Freq: Once | INTRAMUSCULAR | Status: AC
  Administered 2023-02-06: 1000 ug via INTRAMUSCULAR

## 2023-02-06 NOTE — Assessment & Plan Note (Signed)
Did not tolerate increaesed dose of sertraline due to agitaiton.  Continue lexapro.   No panic attacks reported , recommending trial of Relaxium for insomnia

## 2023-02-06 NOTE — Progress Notes (Unsigned)
Subjective:  Patient ID: Katrina Woods, female    DOB: November 09, 1956  Age: 67 y.o. MRN: [id number]  CC: The primary encounter diagnosis was Encounter for screening mammogram for malignant neoplasm of breast. Diagnoses of Tubular adenoma of colon, Neck pain, musculoskeletal, Depression with anxiety, B12 deficiency, Hyperlipidemia LDL goal <100, and Myalgia due to statin were also pertinent to this visit.   HPI Katrina Woods presents for  Chief Complaint  Patient presents with   Medical Management of Chronic Issues   Depression   Anxiety   2) GAD:  Did not tolerate sertraline due to agitation.  Tolerating lexapro better.  Has not had any panic attacks .   2) Insomnia:   doesn't sleep without alprazolam but wants to avoid daily use. Chronic, with no improvement using over-the-counter first generation antihistamines. Reviewed principles of good sleep hygiene.  Complicated by OSA.  Tried melatonin.    ) B12 deficiency;  IF ab positive ;  had 3 weekly doses in December,  then stopped  taking shots   OA/DDD  cervical disk disease , chronic pain in neck on the left side,  becomes tight and progressively more painful and stiff .  Using MR  , heat , tylenol, and celebrex  but NSAID's  are C/I secondar y to  use of Brilinta.  Has been participating in activities like sewing which she now realizes are likely  aggravating it  due to positioning of head/neck  Outpatient Medications Prior to Visit  Medication Sig Dispense Refill   acetaminophen (TYLENOL) 325 MG tablet Take 650 mg by mouth every 6 (six) hours as needed for moderate pain or headache.     aspirin EC 81 MG tablet Take 81 mg by mouth daily. Swallow whole.     escitalopram (LEXAPRO) 20 MG tablet Take 1 tablet (20 mg total) by mouth daily. 90 tablet 1   ezetimibe (ZETIA) 10 MG tablet TAKE 1 TABLET BY MOUTH EVERY DAY 90 tablet 3   lansoprazole (PREVACID) 30 MG capsule TAKE 1 CAPSULE (30 MG TOTAL) BY MOUTH 2 (TWO) TIMES DAILY  BEFORE A MEAL. 60 capsule 2   ticagrelor (BRILINTA) 90 MG TABS tablet Take 1 tablet (90 mg total) by mouth 2 (two) times daily. 60 tablet 6   celecoxib (CELEBREX) 200 MG capsule TAKE 1 CAPSULE BY MOUTH TWICE A DAY (Patient not taking: Reported on 02/06/2023) 60 capsule 2   tiZANidine (ZANAFLEX) 4 MG tablet Take 1 tablet (4 mg total) by mouth every 6 (six) hours as needed for muscle spasms. (Patient not taking: Reported on 02/06/2023) 30 tablet 0   Facility-Administered Medications Prior to Visit  Medication Dose Route Frequency Provider Last Rate Last Admin   aspirin tablet 325 mg  325 mg Oral Once Boisseau, Hayley, PA        Review of Systems;  Patient denies  fevers, malaise, unintentional weight loss, skin rash, eye pain, sinus congestion and sinus pain, sore throat, dysphagia,  hemoptysis , cough, dyspnea, wheezing, chest pain, palpitations, orthopnea, edema, abdominal pain, nausea, melena, diarrhea, constipation, flank pain, dysuria, hematuria, urinary  Frequency, nocturia, numbness, tingling, seizures,  Focal weakness, recent syncope,   Tremor, , and suicidal ideation.      Objective:  BP 134/78   Pulse (!) 52   Temp 98 F (36.7 C) (Oral)   Resp 14   Ht '5\' 2"'$  (1.575 m)   Wt 117 lb 6.4 oz (53.3 kg)   SpO2 98%   BMI 21.47 kg/m  BP Readings from Last 3 Encounters:  02/06/23 134/78  11/05/22 112/70  01/30/22 110/70    Wt Readings from Last 3 Encounters:  02/06/23 117 lb 6.4 oz (53.3 kg)  11/05/22 123 lb (55.8 kg)  09/24/22 133 lb (60.3 kg)    Physical Exam Vitals reviewed.  Constitutional:      General: She is not in acute distress.    Appearance: Normal appearance. She is normal weight. She is not ill-appearing, toxic-appearing or diaphoretic.  HENT:     Head: Normocephalic.  Eyes:     General: No scleral icterus.       Right eye: No discharge.        Left eye: No discharge.     Conjunctiva/sclera: Conjunctivae normal.  Cardiovascular:     Rate and Rhythm:  Normal rate and regular rhythm.     Heart sounds: Normal heart sounds.  Pulmonary:     Effort: Pulmonary effort is normal. No respiratory distress.     Breath sounds: Normal breath sounds.  Musculoskeletal:        General: Normal range of motion.  Skin:    General: Skin is warm and dry.  Neurological:     General: No focal deficit present.     Mental Status: She is alert and oriented to person, place, and time. Mental status is at baseline.  Psychiatric:        Mood and Affect: Mood normal.        Behavior: Behavior normal.        Thought Content: Thought content normal.        Judgment: Judgment normal.   Lab Results  Component Value Date   HGBA1C 5.8 11/05/2022   HGBA1C 5.8 10/20/2021    Lab Results  Component Value Date   CREATININE 0.86 11/05/2022   CREATININE 0.95 11/24/2021   CREATININE 0.82 11/23/2021    Lab Results  Component Value Date   WBC 8.4 11/05/2022   HGB 14.6 11/05/2022   HCT 43.7 11/05/2022   PLT 279.0 11/05/2022   GLUCOSE 98 11/05/2022   CHOL 208 (H) 11/05/2022   TRIG 102.0 11/05/2022   HDL 66.20 11/05/2022   LDLDIRECT 166.0 10/14/2017   LDLCALC 121 (H) 11/05/2022   ALT 6 11/05/2022   AST 15 11/05/2022   NA 136 11/05/2022   K 4.5 11/05/2022   CL 100 11/05/2022   CREATININE 0.86 11/05/2022   BUN 13 11/05/2022   CO2 26 11/05/2022   TSH 3.92 11/05/2022   INR 0.9 11/23/2021   HGBA1C 5.8 11/05/2022   MICROALBUR <0.7 10/26/2019    MR BRAIN WO CONTRAST  Result Date: 07/31/2022 CLINICAL DATA:  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 EXAM: MRI HEAD WITHOUT CONTRAST MRA HEAD WITHOUT CONTRAST TECHNIQUE: Multiplanar, multi-echo pulse sequences of the brain and surrounding structures were acquired without intravenous contrast. Angiographic images of the Circle of Willis were acquired using MRA technique without intravenous contrast. COMPARISON:  MRI head and MRA head 09/08/2021. Cerebral arteriogram and  intervention images 11/23/21 FINDINGS: MRI HEAD FINDINGS Brain: No acute infarct, hemorrhage, or mass lesion is present. Scattered subcortical T2 hyperintensities are stable. Focal gliosis in the anterior right frontal lobe is stable. The ventricles are of normal size. Basal ganglia are within normal limits. The internal auditory canals are within normal limits. The brainstem and cerebellum are within normal limits. Vascular: Flow is present in the major intracranial arteries. Susceptibility artifact evident from the coil mass and stent. Skull and upper cervical  spine: The craniocervical junction is normal. Upper cervical spine is within normal limits. Marrow signal is unremarkable. Sinuses/Orbits: The paranasal sinuses and mastoid air cells are clear. The globes and orbits are within normal limits. MRA HEAD FINDINGS Anterior circulation: The internal carotid arteries are within normal limits from the high cervical segments through the ICA termini. The A1 and M1 segments are normal. The anterior communicating artery is patent. MCA bifurcations are within normal limits. ACA and MCA branch vessels are unremarkable. Posterior circulation: The vertebral arteries are codominant. Right PICA origin is visualized and normal. Prominent AICA vessels are noted bilaterally. Signal loss is present scratched at signal loss in the distal basilar artery and left P1 and P2 segments is secondary to artifact from the stent. Flow signal along the right base of the coil mass measures 1.5 mm, decreased in size from the prior exam. Anatomic variants: None IMPRESSION: 1. Decreased size of residual flow signal, aneurysm remnant, along the right base of the coil mass. The residual aneurysm now measures 1.5 mm. 2. Stable atrophy and white matter disease likely reflecting the sequela of chronic microvascular ischemia. 3. No acute intracranial abnormality or significant interval change. Electronically Signed   By: San Morelle M.D.    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 EXAM: MRI HEAD WITHOUT CONTRAST MRA HEAD WITHOUT CONTRAST TECHNIQUE: Multiplanar, multi-echo pulse sequences of the brain and surrounding structures were acquired without intravenous contrast. Angiographic images of the Circle of Willis were acquired using MRA technique without intravenous contrast. COMPARISON:  MRI head and MRA head 09/08/2021. Cerebral arteriogram and intervention images 11/23/21 FINDINGS: MRI HEAD FINDINGS Brain: No acute infarct, hemorrhage, or mass lesion is present. Scattered subcortical T2 hyperintensities are stable. Focal gliosis in the anterior right frontal lobe is stable. The ventricles are of normal size. Basal ganglia are within normal limits. The internal auditory canals are within normal limits. The brainstem and cerebellum are within normal limits. Vascular: Flow is present in the major intracranial arteries. Susceptibility artifact evident from the coil mass and stent. Skull and upper cervical spine: The craniocervical junction is normal. Upper cervical spine is within normal limits. Marrow signal is unremarkable. Sinuses/Orbits: The paranasal sinuses and mastoid air cells are clear. The globes and orbits are within normal limits. MRA HEAD FINDINGS Anterior circulation: The internal carotid arteries are within normal limits from the high cervical segments through the ICA termini. The A1 and M1 segments are normal. The anterior communicating artery is patent. MCA bifurcations are within normal limits. ACA and MCA branch vessels are unremarkable. Posterior circulation: The vertebral arteries are codominant. Right PICA origin is visualized and normal. Prominent AICA vessels are noted bilaterally. Signal loss is present scratched at signal loss in the distal basilar artery and left P1 and P2 segments is  secondary to artifact from the stent. Flow signal along the right base of the coil mass measures 1.5 mm, decreased in size from the prior exam. Anatomic variants: None IMPRESSION: 1. Decreased size of residual flow signal, aneurysm remnant, along the right base of the coil mass. The residual aneurysm now measures 1.5 mm. 2. Stable atrophy and white matter disease likely reflecting the sequela of chronic microvascular ischemia. 3. No acute intracranial abnormality or significant interval change. Electronically Signed   By: San Morelle M.D.   On: 07/31/2022 16:18    Assessment & Plan:  .Encounter for screening  mammogram for malignant neoplasm of breast -     3D Screening Mammogram, Left and Right; Future  Tubular adenoma of colon Assessment & Plan: REFERRAL MADE TO DR Alphonsus Sias FOR 5 YR FOLLOW UP  Orders: -     Ambulatory referral to Gastroenterology  Neck pain, musculoskeletal Assessment & Plan: reviewed plain films:  she has degenerative at multiple levels . she has a mild radiculopathy  Resulting in pain rediating to her elbow without weakness of distal arm.  She cannot continue celebrex given her chronic use of Brilinta s/p coiling of cerebral aneurysm.  Continue MR and tylenol , adding  tramadol . Narcotics contract signed.    Orders: -     traMADol HCl; Take 1 tablet (50 mg total) by mouth every 12 (twelve) hours as needed for up to 5 days.  Dispense: 60 tablet; Refill: 5  Depression with anxiety Assessment & Plan: Did not tolerate increaesed dose of sertraline due to agitaiton.  Continue lexapro.   No panic attacks reported , recommending trial of Relaxium for insomnia   B12 deficiency Assessment & Plan: She has been advised to start monthly injections,  and was given her first one  today.    Orders: -     Cyanocobalamin  Hyperlipidemia LDL goal <100 Assessment & Plan: Managed with Zetia due to statin myalgia    Myalgia due to statin Assessment & Plan: She is  tolerating zetia  after recurrent statin trials were not tolerated.      Follow-up: Return in about 6 months (around 08/07/2023) for chronic pain management.   Crecencio Mc, MD

## 2023-02-06 NOTE — Assessment & Plan Note (Signed)
REFERRAL MADE TO DR Alphonsus Sias FOR 5 YR FOLLOW UP

## 2023-02-06 NOTE — Assessment & Plan Note (Signed)
She has been advised to start monthly injections,  and was given her first one  today.

## 2023-02-06 NOTE — Patient Instructions (Addendum)
A mammogram has been ordered for you. Please contact [facility] at [phone] to schedule  Please set up your colonoscopy. You dare due NOW. REFERRAL HAS BEEN MADE TO DR Fuller Plan   I will refill  tramadol  for twice daily use. It can be combined with tylenol.  NO MORE CELEBREX, ALEVE OR MOTRIN.     each rx is for  30 days, 5 refills can be done  but you need to be seen  Before any additional  Refills can be done because tramadol is a controlled substance. 6 month follow ups are needed per your controlled substance contract.    Your still need your tetanus-diptheria-pertussis vaccine (TDaP) but you can get it for FREE  at a local pharmacy   You might want to try using Relaxium for insomnia  (as seen on TV commercials) . It contains:  Melatonin 5 mg  Chamomile 25 mg Passionflower extract 75 mg GABA 100 mg Ashwaganda extract 125 mg Magnesium citrate, glycinate, oxide (100 mg)  L tryptophan 500 mg Valerest (proprietary  ingredient ; probably valeria root extract)

## 2023-02-07 NOTE — Assessment & Plan Note (Signed)
reviewed plain films:  she has degenerative at multiple levels . she has a mild radiculopathy  Resulting in pain rediating to her elbow without weakness of distal arm.  She cannot continue celebrex given her chronic use of Brilinta s/p coiling of cerebral aneurysm.  Continue MR and tylenol , adding  tramadol . Narcotics contract signed.

## 2023-02-07 NOTE — Assessment & Plan Note (Signed)
Managed with Zetia due to statin myalgia

## 2023-02-07 NOTE — Assessment & Plan Note (Signed)
She is tolerating zetia  after recurrent statin trials were not tolerated.

## 2023-02-18 ENCOUNTER — Ambulatory Visit: Payer: PPO

## 2023-02-18 NOTE — Progress Notes (Deleted)
Pt presented for their vitamin B12 injection. Pt was identified through two identifiers. Pt tolerated shot well in their left or right deltoid.

## 2023-04-13 ENCOUNTER — Other Ambulatory Visit: Payer: Self-pay | Admitting: Family

## 2023-05-06 ENCOUNTER — Other Ambulatory Visit: Payer: Self-pay | Admitting: Internal Medicine

## 2023-07-10 ENCOUNTER — Encounter (INDEPENDENT_AMBULATORY_CARE_PROVIDER_SITE_OTHER): Payer: Self-pay

## 2023-07-11 ENCOUNTER — Other Ambulatory Visit: Payer: Self-pay | Admitting: Internal Medicine

## 2023-07-24 ENCOUNTER — Encounter: Payer: Self-pay | Admitting: Internal Medicine

## 2023-08-07 ENCOUNTER — Ambulatory Visit (INDEPENDENT_AMBULATORY_CARE_PROVIDER_SITE_OTHER): Payer: PPO | Admitting: Internal Medicine

## 2023-08-07 ENCOUNTER — Encounter: Payer: Self-pay | Admitting: Internal Medicine

## 2023-08-07 VITALS — BP 110/66 | HR 53 | Ht 62.0 in | Wt 111.0 lb

## 2023-08-07 DIAGNOSIS — G629 Polyneuropathy, unspecified: Secondary | ICD-10-CM | POA: Diagnosis not present

## 2023-08-07 DIAGNOSIS — R030 Elevated blood-pressure reading, without diagnosis of hypertension: Secondary | ICD-10-CM | POA: Diagnosis not present

## 2023-08-07 DIAGNOSIS — M79672 Pain in left foot: Secondary | ICD-10-CM

## 2023-08-07 DIAGNOSIS — D510 Vitamin B12 deficiency anemia due to intrinsic factor deficiency: Secondary | ICD-10-CM

## 2023-08-07 DIAGNOSIS — R634 Abnormal weight loss: Secondary | ICD-10-CM | POA: Diagnosis not present

## 2023-08-07 DIAGNOSIS — E785 Hyperlipidemia, unspecified: Secondary | ICD-10-CM

## 2023-08-07 DIAGNOSIS — M79671 Pain in right foot: Secondary | ICD-10-CM

## 2023-08-07 DIAGNOSIS — R200 Anesthesia of skin: Secondary | ICD-10-CM | POA: Diagnosis not present

## 2023-08-07 DIAGNOSIS — M542 Cervicalgia: Secondary | ICD-10-CM | POA: Diagnosis not present

## 2023-08-07 DIAGNOSIS — R202 Paresthesia of skin: Secondary | ICD-10-CM | POA: Diagnosis not present

## 2023-08-07 DIAGNOSIS — Z720 Tobacco use: Secondary | ICD-10-CM | POA: Diagnosis not present

## 2023-08-07 HISTORY — DX: Vitamin B12 deficiency anemia due to intrinsic factor deficiency: D51.0

## 2023-08-07 LAB — MICROALBUMIN / CREATININE URINE RATIO
Creatinine,U: 142.1 mg/dL
Microalb Creat Ratio: 0.8 mg/g (ref 0.0–30.0)
Microalb, Ur: 1.1 mg/dL (ref 0.0–1.9)

## 2023-08-07 LAB — CBC WITH DIFFERENTIAL/PLATELET
Basophils Absolute: 0.1 10*3/uL (ref 0.0–0.1)
Basophils Relative: 0.8 % (ref 0.0–3.0)
Eosinophils Absolute: 0.1 10*3/uL (ref 0.0–0.7)
Eosinophils Relative: 0.8 % (ref 0.0–5.0)
HCT: 44.4 % (ref 36.0–46.0)
Hemoglobin: 14.4 g/dL (ref 12.0–15.0)
Lymphocytes Relative: 24.3 % (ref 12.0–46.0)
Lymphs Abs: 1.7 10*3/uL (ref 0.7–4.0)
MCHC: 32.5 g/dL (ref 30.0–36.0)
MCV: 94.3 fl (ref 78.0–100.0)
Monocytes Absolute: 0.5 10*3/uL (ref 0.1–1.0)
Monocytes Relative: 6.9 % (ref 3.0–12.0)
Neutro Abs: 4.8 10*3/uL (ref 1.4–7.7)
Neutrophils Relative %: 67.2 % (ref 43.0–77.0)
Platelets: 262 10*3/uL (ref 150.0–400.0)
RBC: 4.71 Mil/uL (ref 3.87–5.11)
RDW: 14.5 % (ref 11.5–15.5)
WBC: 7.1 10*3/uL (ref 4.0–10.5)

## 2023-08-07 LAB — COMPREHENSIVE METABOLIC PANEL
ALT: 6 U/L (ref 0–35)
AST: 14 U/L (ref 0–37)
Albumin: 4 g/dL (ref 3.5–5.2)
Alkaline Phosphatase: 57 U/L (ref 39–117)
BUN: 11 mg/dL (ref 6–23)
CO2: 27 mEq/L (ref 19–32)
Calcium: 10 mg/dL (ref 8.4–10.5)
Chloride: 106 mEq/L (ref 96–112)
Creatinine, Ser: 0.77 mg/dL (ref 0.40–1.20)
GFR: 79.65 mL/min (ref >60.00)
Glucose, Bld: 97 mg/dL (ref 70–99)
Potassium: 4.3 mEq/L (ref 3.5–5.1)
Sodium: 140 mEq/L (ref 135–145)
Total Bilirubin: 0.4 mg/dL (ref 0.2–1.2)
Total Protein: 6.6 g/dL (ref 6.0–8.3)

## 2023-08-07 LAB — LIPID PANEL
Cholesterol: 206 mg/dL — ABNORMAL HIGH (ref 0–200)
HDL: 64.3 mg/dL (ref >39.00)
LDL Cholesterol: 116 mg/dL — ABNORMAL HIGH (ref 0–99)
NonHDL: 141.46
Total CHOL/HDL Ratio: 3
Triglycerides: 128 mg/dL (ref 0.0–149.0)
VLDL: 25.6 mg/dL (ref 0.0–40.0)

## 2023-08-07 LAB — LDL CHOLESTEROL, DIRECT: Direct LDL: 131 mg/dL

## 2023-08-07 LAB — HEMOGLOBIN A1C: Hgb A1c MFr Bld: 5.7 % (ref 4.6–6.5)

## 2023-08-07 LAB — TSH: TSH: 4.32 u[IU]/mL (ref 0.35–5.50)

## 2023-08-07 LAB — B12 AND FOLATE PANEL
Folate: 12.7 ng/mL (ref >5.9)
Vitamin B-12: 341 pg/mL (ref 211–911)

## 2023-08-07 MED ORDER — LANSOPRAZOLE 30 MG PO CPDR: 30.0000 mg | DELAYED_RELEASE_CAPSULE | Freq: Two times a day (BID) | ORAL | 2 refills | Status: DC

## 2023-08-07 NOTE — Assessment & Plan Note (Signed)
Guidelines for lung ca screening recommend screening any individual with a 35 or higher pack year history and history of smoking within 15 yrs

## 2023-08-07 NOTE — Patient Instructions (Addendum)
1) Weight loss:  You have lost 20 lbs since last October!  This is concerning.  You need  60 g protein daily   Please try a premixed protein drink called Premier Protein shake in the morning.  It is less $$$ and very low sugar.    160 cal  30 g protein  1 g sugar 50% calcium needs    ALTERNATIVE IS ENSURE COMPLETE    2)For your pain;  you didn't give the medications a fair shot!    PLEASE RESUME TYLENOL (ACETOMINOPhen) AND TRAMADOL  as follows:  Acetaminophen  1000 mg every 12 hours  PLUS 50 MG TRAMADOL every 6 hours     3) Your foot pain is likely due to b12 deficiency and a morton's neuromq.  Podiatry referral in progress  4) Referral to [facility] for LUNG CANCER SCREENING  IS IN PROGRESS

## 2023-08-07 NOTE — Progress Notes (Signed)
In review of your chart I noticed that you have not hd your mammogram which was due in march,  and your colonoscopy referral which was mae in February doesn't seem to have been processed yet?  Have you been called for either?

## 2023-08-07 NOTE — Assessment & Plan Note (Addendum)
Radiates to trap muscles and upper back.  No relief with trial  of  tramadol .  Hands will go numb with sewing and driving /  previous trial of gabapentin   by her previous pain  management   doctor in [place] made her "feel like a vegetable" but she was started at 800 mg . Not using tylenol consistebtky

## 2023-08-07 NOTE — Assessment & Plan Note (Signed)
Untreated bc her pharmacy ([facility])  does not administer b12 injections .  Repeating levels today

## 2023-08-07 NOTE — Progress Notes (Signed)
Subjective:  Patient ID: Katrina Woods, female    DOB: Aug 13, 1956  Age: 67 y.o. MRN: [id number]  CC: The primary encounter diagnosis was Hyperlipidemia LDL goal <100. Diagnoses of Elevated blood pressure reading, Neck pain, musculoskeletal, Pernicious anemia, Unintentional weight loss, Tobacco abuse, Numbness and tingling of both feet, Bilateral foot pain, Elevated blood pressure reading without diagnosis of hypertension, Weight loss, and Neuropathy were also pertinent to this visit.   HPI Katrina Woods presents for  Chief Complaint  Patient presents with   Medical Management of Chronic Issues    6 month follow up    1) chronic pain:  at last visit she as prescribed tramadol , but states that it did  not help her neck pain (arthritic with restricted ROM)  has not tried consistnet se of tramadol and tylenol (last refill 30 tramadol in February)  2) new onset foot pain : pain  is localized  to the balls of her feet .e has HAS not danced since April or gone walking bc of foot pain    3) unintentional weight loss of 22 lbs since Oct 2023:  Denies post prandial abd pain but reports nocturnal indigestion  and tooth pain due to bad teeth. Has been avoiding meat because she   Can't chew meat without mouth pain.  Kayren Eaves to a dental clinic to have a bridge  on the top (due to sinus cavities interfering with implants.)  eviewed diet:  2 meals daily.  Jimmy dean egg/sausage biscuit at noon,   oatmeal for dinner at night.  Usually nothing in between.    4) BILATERAL NEUROPATHIC PAIN IN FEET, AFFECTING THE FOREFOOT HAS BEEN OCCURRING FOR THE PAST SEVERAL 6 MONTHS.   [place]   Outpatient Medications Prior to Visit  Medication Sig Dispense Refill   escitalopram (LEXAPRO) 20 MG tablet TAKE 1 TABLET BY MOUTH EVERY DAY 90 tablet 1   ezetimibe (ZETIA) 10 MG tablet TAKE 1 TABLET BY MOUTH EVERY DAY 90 tablet 3   ticagrelor (BRILINTA) 90 MG TABS tablet Take 1 tablet (90 mg total)  by mouth 2 (two) times daily. 60 tablet 6   traMADol (ULTRAM) 50 MG tablet Take 50 mg by mouth 2 (two) times daily as needed for moderate pain.     lansoprazole (PREVACID) 30 MG capsule TAKE 1 CAPSULE (30 MG TOTAL) BY MOUTH 2 (TWO) TIMES DAILY BEFORE A MEAL. 60 capsule 2   acetaminophen (TYLENOL) 325 MG tablet Take 650 mg by mouth every 6 (six) hours as needed for moderate pain or headache. (Patient not taking: Reported on 08/07/2023)     aspirin EC 81 MG tablet Take 81 mg by mouth daily. Swallow whole. (Patient not taking: Reported on 08/07/2023)     celecoxib (CELEBREX) 200 MG capsule TAKE 1 CAPSULE BY MOUTH TWICE A DAY (Patient not taking: Reported on 02/06/2023) 60 capsule 2   tiZANidine (ZANAFLEX) 4 MG tablet Take 1 tablet (4 mg total) by mouth every 6 (six) hours as needed for muscle spasms. (Patient not taking: Reported on 02/06/2023) 30 tablet 0   Facility-Administered Medications Prior to Visit  Medication Dose Route Frequency Provider Last Rate Last Admin   aspirin tablet 325 mg  325 mg Oral Once Boisseau, Hayley, PA        Review of Systems;  Patient denies headache, fevers, malaise, unintentional weight loss, skin rash, eye pain, sinus congestion and sinus pain, sore throat, dysphagia,  hemoptysis , cough, dyspnea, wheezing, chest pain,  palpitations, orthopnea, edema, abdominal pain, nausea, melena, diarrhea, constipation, flank pain, dysuria, hematuria, urinary  Frequency, nocturia, numbness, tingling, seizures,  Focal weakness, Loss of consciousness,  Tremor, insomnia, depression, anxiety, and suicidal ideation.      Objective:  BP 110/66   Pulse (!) 53   Ht 5\' 2"  (1.575 m)   Wt 111 lb (50.3 kg)   SpO2 98%   BMI 20.30 kg/m   BP Readings from Last 3 Encounters:  08/07/23 110/66  02/06/23 134/78  11/05/22 112/70    Wt Readings from Last 3 Encounters:  08/07/23 111 lb (50.3 kg)  02/06/23 117 lb 6.4 oz (53.3 kg)  11/05/22 123 lb (55.8 kg)    Physical Exam Vitals  reviewed.  Constitutional:      General: She is not in acute distress.    Appearance: Normal appearance. She is normal weight. She is not ill-appearing, toxic-appearing or diaphoretic.  HENT:     Head: Normocephalic.  Eyes:     General: No scleral icterus.       Right eye: No discharge.        Left eye: No discharge.     Conjunctiva/sclera: Conjunctivae normal.  Cardiovascular:     Rate and Rhythm: Normal rate and regular rhythm.     Heart sounds: Normal heart sounds.  Pulmonary:     Effort: Pulmonary effort is normal. No respiratory distress.     Breath sounds: Normal breath sounds.  Musculoskeletal:        General: Normal range of motion.  Skin:    General: Skin is warm and dry.  Neurological:     General: No focal deficit present.     Mental Status: She is alert and oriented to person, place, and time. Mental status is at baseline.  Psychiatric:        Mood and Affect: Mood normal.        Behavior: Behavior normal.        Thought Content: Thought content normal.        Judgment: Judgment normal.    Lab Results  Component Value Date   HGBA1C 5.7 08/07/2023   HGBA1C 5.8 11/05/2022   HGBA1C 5.8 10/20/2021    Lab Results  Component Value Date   CREATININE 0.77 08/07/2023   CREATININE 0.86 11/05/2022   CREATININE 0.95 11/24/2021    Lab Results  Component Value Date   WBC 7.1 08/07/2023   HGB 14.4 08/07/2023   HCT 44.4 08/07/2023   PLT 262.0 08/07/2023   GLUCOSE 97 08/07/2023   CHOL 206 (H) 08/07/2023   TRIG 128.0 08/07/2023   HDL 64.30 08/07/2023   LDLDIRECT 131.0 08/07/2023   LDLCALC 116 (H) 08/07/2023   ALT 6 08/07/2023   AST 14 08/07/2023   NA 140 08/07/2023   K 4.3 08/07/2023   CL 106 08/07/2023   CREATININE 0.77 08/07/2023   BUN 11 08/07/2023   CO2 27 08/07/2023   TSH 4.32 08/07/2023   INR 0.9 11/23/2021   HGBA1C 5.7 08/07/2023   MICROALBUR 1.1 08/07/2023    MR BRAIN WO CONTRAST  Result Date: 07/31/2022 CLINICAL DATA:  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 EXAM: MRI HEAD WITHOUT CONTRAST MRA HEAD WITHOUT CONTRAST TECHNIQUE: Multiplanar, multi-echo pulse sequences of the brain and surrounding structures were acquired without intravenous contrast. Angiographic images of the Circle of Willis were acquired using MRA technique without intravenous contrast. COMPARISON:  MRI head and MRA head 09/08/2021. Cerebral arteriogram and intervention images 11/23/21 FINDINGS:  MRI HEAD FINDINGS Brain: No acute infarct, hemorrhage, or mass lesion is present. Scattered subcortical T2 hyperintensities are stable. Focal gliosis in the anterior right frontal lobe is stable. The ventricles are of normal size. Basal ganglia are within normal limits. The internal auditory canals are within normal limits. The brainstem and cerebellum are within normal limits. Vascular: Flow is present in the major intracranial arteries. Susceptibility artifact evident from the coil mass and stent. Skull and upper cervical spine: The craniocervical junction is normal. Upper cervical spine is within normal limits. Marrow signal is unremarkable. Sinuses/Orbits: The paranasal sinuses and mastoid air cells are clear. The globes and orbits are within normal limits. MRA HEAD FINDINGS Anterior circulation: The internal carotid arteries are within normal limits from the high cervical segments through the ICA termini. The A1 and M1 segments are normal. The anterior communicating artery is patent. MCA bifurcations are within normal limits. ACA and MCA branch vessels are unremarkable. Posterior circulation: The vertebral arteries are codominant. Right PICA origin is visualized and normal. Prominent AICA vessels are noted bilaterally. Signal loss is present scratched at signal loss in the distal basilar artery and left P1 and P2 segments is secondary to artifact from the stent. Flow signal along the right base of the coil mass measures 1.5 mm, decreased in  size from the prior exam. Anatomic variants: None IMPRESSION: 1. Decreased size of residual flow signal, aneurysm remnant, along the right base of the coil mass. The residual aneurysm now measures 1.5 mm. 2. Stable atrophy and white matter disease likely reflecting the sequela of chronic microvascular ischemia. 3. No acute intracranial abnormality or significant interval change. Electronically Signed   By: Marin Roberts M.D.   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 EXAM: MRI HEAD WITHOUT CONTRAST MRA HEAD WITHOUT CONTRAST TECHNIQUE: Multiplanar, multi-echo pulse sequences of the brain and surrounding structures were acquired without intravenous contrast. Angiographic images of the Circle of Willis were acquired using MRA technique without intravenous contrast. COMPARISON:  MRI head and MRA head 09/08/2021. Cerebral arteriogram and intervention images 11/23/21 FINDINGS: MRI HEAD FINDINGS Brain: No acute infarct, hemorrhage, or mass lesion is present. Scattered subcortical T2 hyperintensities are stable. Focal gliosis in the anterior right frontal lobe is stable. The ventricles are of normal size. Basal ganglia are within normal limits. The internal auditory canals are within normal limits. The brainstem and cerebellum are within normal limits. Vascular: Flow is present in the major intracranial arteries. Susceptibility artifact evident from the coil mass and stent. Skull and upper cervical spine: The craniocervical junction is normal. Upper cervical spine is within normal limits. Marrow signal is unremarkable. Sinuses/Orbits: The paranasal sinuses and mastoid air cells are clear. The globes and orbits are within normal limits. MRA HEAD FINDINGS Anterior circulation: The internal carotid arteries are within normal limits from the high cervical segments through the ICA  termini. The A1 and M1 segments are normal. The anterior communicating artery is patent. MCA bifurcations are within normal limits. ACA and MCA branch vessels are unremarkable. Posterior circulation: The vertebral arteries are codominant. Right PICA origin is visualized and normal. Prominent AICA vessels are noted bilaterally. Signal loss is present scratched at signal loss in the distal basilar artery and left P1 and P2 segments is secondary to artifact from the stent. Flow signal along the right base of the coil mass measures 1.5 mm, decreased in size from  the prior exam. Anatomic variants: None IMPRESSION: 1. Decreased size of residual flow signal, aneurysm remnant, along the right base of the coil mass. The residual aneurysm now measures 1.5 mm. 2. Stable atrophy and white matter disease likely reflecting the sequela of chronic microvascular ischemia. 3. No acute intracranial abnormality or significant interval change. Electronically Signed   By: Marin Roberts M.D.   On: 07/31/2022 16:18    Assessment & Plan:  .Hyperlipidemia LDL goal <100 -     Lipid panel -     LDL cholesterol, direct -     Comprehensive metabolic panel  Elevated blood pressure reading -     Comprehensive metabolic panel  Neck pain, musculoskeletal Assessment & Plan: Radiates to trap muscles and upper back.  No relief with trial  of  tramadol .  Hands will go numb with sewing and driving /  previous trial of gabapentin   by her previous pain  management   doctor in [place] made her "feel like a vegetable" but she was started at 800 mg . Not using tylenol consistebtky   Pernicious anemia Assessment & Plan: Untreated bc her pharmacy ([facility])  does not administer b12 injections .  Repeating levels today  Orders: -     B12 and Folate Panel -     Methylmalonic acid, serum  Unintentional weight loss -     Hemoglobin A1c -     TSH -     CBC with Differential/Platelet  Tobacco abuse Assessment & Plan: Guidelines for  lung ca screening recommend screening any individual with a 35 or higher pack year history and history of smoking within 15 yrs    Orders: -     Ambulatory Referral for Lung Cancer Scre  Numbness and tingling of both feet -     Heavy Metals Profile, Urine; Future  Bilateral foot pain -     Ambulatory referral to Podiatry  Elevated blood pressure reading without diagnosis of hypertension -     Microalbumin / creatinine urine ratio  Weight loss Assessment & Plan: She reports decrease po intake fue to dental issues.  Screening labs normal .  She is over due for mammogram and due for 5 yr  colonoscopy (,referral was made in February) as well as for lung Ca screening  Encouraged to increase protein intake.  Referring for lung CA screening    Neuropathy Assessment & Plan: Screening for heavy metals In progress.  Referring for EMG/[place] studies recommended.    Other orders -     Lansoprazole; Take 1 capsule (30 mg total) by mouth 2 (two) times daily before a meal.  Dispense: 60 capsule; Refill: 2     I provided  40 minutes on the day of this face-to-face  encounter reviewing patient's last visit with me, patient's  most recent visit with gastroenterology, , prior  surgical and non surgical procedures, previous  labs and imaging studies, counseling on currently addressed issues,  and post visit ordering to diagnostics and therapeutics .    Follow-up: Return in about 4 weeks (around 09/04/2023) for chronic pain management.   Sherlene Shams, MD

## 2023-08-07 NOTE — Assessment & Plan Note (Signed)
Screening for heavy metals In progress.  Referring for EMG/[place] studies recommended.

## 2023-08-07 NOTE — Assessment & Plan Note (Addendum)
She reports decrease po intake fue to dental issues.  Screening labs normal .  She is over due for mammogram and due for 5 yr  colonoscopy (,referral was made in February) as well as for lung Ca screening  Encouraged to increase protein intake.  Referring for lung CA screening

## 2023-08-08 ENCOUNTER — Encounter: Payer: Self-pay | Admitting: *Deleted

## 2023-08-10 LAB — METHYLMALONIC ACID, SERUM: Methylmalonic Acid, Quant: 153 nmol/L (ref 69–390)

## 2023-08-13 ENCOUNTER — Telehealth: Payer: Self-pay | Admitting: Student

## 2023-08-13 ENCOUNTER — Telehealth (HOSPITAL_COMMUNITY): Payer: Self-pay

## 2023-08-13 ENCOUNTER — Telehealth: Payer: Self-pay

## 2023-08-13 DIAGNOSIS — R200 Anesthesia of skin: Secondary | ICD-10-CM | POA: Diagnosis not present

## 2023-08-13 DIAGNOSIS — R202 Paresthesia of skin: Secondary | ICD-10-CM | POA: Diagnosis not present

## 2023-08-13 MED ORDER — TICAGRELOR 90 MG PO TABS: 90.0000 mg | ORAL_TABLET | Freq: Two times a day (BID) | ORAL | 6 refills | Status: DC

## 2023-08-13 NOTE — Telephone Encounter (Signed)
Patient states she wants to start back with her B12 shots.  Patient states she would like for this prescription to be sent to [facility].  Patient states she would like for all her prescriptions, going forward, to be sent to [facility].  Patient states she has been referred to Dr. Claudette Head and has not heard from their office.  I looked up referral and let patient know that I did see a note in the system that states she will reach out to their office to schedule.  Patient states she thought they would be calling her, so she misunderstood.  Patient states she will call them.Patient notified that their request is being sent to the clinical staff for review and that they should receive a response within 2 business days.

## 2023-08-13 NOTE — Telephone Encounter (Signed)
Pt called for refill of Brilinta. I have sent a message to our PA, Molli Hazard to call. AB

## 2023-08-13 NOTE — Telephone Encounter (Signed)
NIR team contacted for medication refill for Brilinta 90 mg BID. Refill sent to patient's preferred pharmacy. Kennieth Francois, PA-C 08/13/2023

## 2023-08-14 ENCOUNTER — Other Ambulatory Visit: Payer: Self-pay | Admitting: Family

## 2023-08-14 MED ORDER — TICAGRELOR 90 MG PO TABS: 90.0000 mg | ORAL_TABLET | Freq: Two times a day (BID) | ORAL | 6 refills | Status: DC

## 2023-08-14 NOTE — Telephone Encounter (Signed)
Pharmacy has been updated.

## 2023-08-14 NOTE — Telephone Encounter (Signed)
Pt has not heard from GI. Referral placed in February. Pt is overdue for Colonscopy.

## 2023-08-15 ENCOUNTER — Encounter: Payer: Self-pay | Admitting: Gastroenterology

## 2023-08-15 NOTE — Telephone Encounter (Signed)
noted 

## 2023-08-16 LAB — HEAVY METALS PROFILE, URINE
Arsenic, 24H Ur: <10 ug/L (ref <=80)
Lead, 24 hr urine: <10 ug/L (ref <80)
Mercury, 24H Ur: <4 ug/L (ref <=20)

## 2023-08-16 NOTE — Telephone Encounter (Signed)
noted 

## 2023-08-27 ENCOUNTER — Telehealth: Payer: Self-pay | Admitting: *Deleted

## 2023-08-27 NOTE — Telephone Encounter (Signed)
Call placed to pt. To verify if she is taking  Brilinta which is a blood thinner,if pt.is on she will need ov and procedure and pre-visit will need to be canceled.

## 2023-09-02 ENCOUNTER — Encounter: Payer: Self-pay | Admitting: Internal Medicine

## 2023-09-02 ENCOUNTER — Telehealth: Payer: Self-pay | Admitting: Internal Medicine

## 2023-09-02 ENCOUNTER — Ambulatory Visit (INDEPENDENT_AMBULATORY_CARE_PROVIDER_SITE_OTHER): Payer: PPO

## 2023-09-02 ENCOUNTER — Encounter: Payer: Self-pay | Admitting: Podiatry

## 2023-09-02 ENCOUNTER — Ambulatory Visit: Payer: PPO | Admitting: Podiatry

## 2023-09-02 DIAGNOSIS — M2010 Hallux valgus (acquired), unspecified foot: Secondary | ICD-10-CM

## 2023-09-02 DIAGNOSIS — M21621 Bunionette of right foot: Secondary | ICD-10-CM

## 2023-09-02 DIAGNOSIS — D492 Neoplasm of unspecified behavior of bone, soft tissue, and skin: Secondary | ICD-10-CM | POA: Diagnosis not present

## 2023-09-02 DIAGNOSIS — M2012 Hallux valgus (acquired), left foot: Secondary | ICD-10-CM

## 2023-09-02 DIAGNOSIS — M2011 Hallux valgus (acquired), right foot: Secondary | ICD-10-CM

## 2023-09-02 DIAGNOSIS — M21622 Bunionette of left foot: Secondary | ICD-10-CM | POA: Diagnosis not present

## 2023-09-02 DIAGNOSIS — M778 Other enthesopathies, not elsewhere classified: Secondary | ICD-10-CM

## 2023-09-02 MED ORDER — TRIAMCINOLONE ACETONIDE 40 MG/ML IJ SUSP
40.0000 mg | Freq: Once | INTRAMUSCULAR | Status: AC
Start: 2023-09-02 — End: 2023-09-02
  Administered 2023-09-02: 40 mg

## 2023-09-02 NOTE — Telephone Encounter (Signed)
Pt would like to be called concerning her B12 script

## 2023-09-02 NOTE — Telephone Encounter (Signed)
Attempted to contact pt.to verification of blood thinner message left with call back #.

## 2023-09-02 NOTE — Telephone Encounter (Signed)
Pt.confirmed that she is taking Brillinta,scheduled for ov 11/12/23,procedure and pre-visit canceled,pt.aware.all questions answered.

## 2023-09-02 NOTE — Progress Notes (Signed)
Subjective:  Patient ID: Katrina Woods, female    DOB: [birth year],  MRN: [id number] HPI Chief Complaint  Patient presents with   Foot Pain    Plantar forefoot right - aching x few months, slight swelling, small callused area, very tender walking, can't wear but certain shoes   New Patient (Initial Visit)    67 y.o. female presents with the above complaint.   ROS: Denies fever chills nausea vomiting muscle aches pains calf pain back pain chest pain shortness of breath  Past Medical History:  Diagnosis Date   Allergy    Aneurysm (HCC)    cerebral   Back pain    Bursitis of left hip 07/2012   takes Meloxicam daily    Depression    takes Lexapro daily   GERD (gastroesophageal reflux disease)    takes Omeprazole daily   Headache    Hiatal hernia 01/2008   History of bronchitis 2015   History of colon polyps    benign   History of kidney stones    History of shingles    Hyperlipidemia    takes Atorvastatin daily   Insomnia    takes Xanax nightly   Sleep apnea    can not tolerate Cpap   Weakness    numbness and tingling-thinks its carpal tunnel   Past Surgical History:  Procedure Laterality Date   ABDOMINAL HYSTERECTOMY  2004   total   CEREBRAL ANEURYSM REPAIR  2006   Deveshwar, coil    CESAREAN SECTION  11/25/1982   COLONOSCOPY     DILATION AND CURETTAGE OF UTERUS     ESOPHAGOGASTRODUODENOSCOPY     IR 3D INDEPENDENT WKST  11/23/2021   IR ANGIO VERTEBRAL SEL VERTEBRAL UNI R MOD SED  11/23/2021   IR CT HEAD LTD  11/23/2021   IR GENERIC HISTORICAL  10/23/2016   IR RADIOLOGIST EVAL & MGMT 10/23/2016 [facility]   IR RADIOLOGIST EVAL & MGMT  09/18/2021   IR RADIOLOGIST EVAL & MGMT  12/18/2021   IR TRANSCATH/EMBOLIZ  11/23/2021   IR US GUIDE VASC ACCESS RIGHT  11/23/2021   KNEE CARTILAGE SURGERY Left 1987   POLYPECTOMY     RADIOLOGY WITH ANESTHESIA N/A 05/11/2015   Procedure: ANEURYSM EMBOLIZATION;  Surgeon: Julieanne Cotton, MD;  Location: [facility] OR;   Service: Radiology;  Laterality: N/A;   RADIOLOGY WITH ANESTHESIA N/A 11/23/2021   Procedure: IR WITH ANESTHESIA EMBOLIZATION;  Surgeon: Julieanne Cotton, MD;  Location: [facility] OR;  Service: Radiology;  Laterality: N/A;   WISDOM TOOTH EXTRACTION  1986   all four extracted    Current Outpatient Medications:    escitalopram (LEXAPRO) 20 MG tablet, TAKE 1 TABLET BY MOUTH EVERY DAY, Disp: 90 tablet, Rfl: 1   ezetimibe (ZETIA) 10 MG tablet, TAKE 1 TABLET BY MOUTH EVERY DAY, Disp: 90 tablet, Rfl: 3   lansoprazole (PREVACID) 30 MG capsule, Take 1 capsule (30 mg total) by mouth 2 (two) times daily before a meal., Disp: 60 capsule, Rfl: 2   ticagrelor (BRILINTA) 90 MG TABS tablet, Take 1 tablet (90 mg total) by mouth 2 (two) times daily., Disp: 60 tablet, Rfl: 6   traMADol (ULTRAM) 50 MG tablet, Take 50 mg by mouth 2 (two) times daily as needed for moderate pain., Disp: , Rfl:  No current facility-administered medications for this visit.  Facility-Administered Medications Ordered in Other Visits:    aspirin tablet 325 mg, 325 mg, Oral, Once, Boisseau, Hayley, PA  Allergies  Allergen Reactions   Etodolac  Unknown reaction   Pollen Extract     Other reaction(s): Unknown   Sertraline     Agitation    Statins     Muscle aches   Wellbutrin [Bupropion]     intolerance   Review of Systems Objective:  There were no vitals filed for this visit.  General: Well developed, nourished, in no acute distress, alert and oriented x3   Dermatological: Skin is warm, dry and supple bilateral. Nails x 10 are well maintained; remaining integument appears unremarkable at this time. There are no open sores, no preulcerative lesions, no rash or signs of infection present.  She has benign skin lesion plantar aspect of the forefoot right just beneath the second metatarsal phalangeal joint.  She also has a small verrucoid lesion just distal to her right heel pad.  It is nontender looks like it has been around for a  long time.  Vascular: Dorsalis Pedis artery and Posterior Tibial artery pedal pulses are 2/4 bilateral with immedate capillary fill time. Pedal hair growth present. No varicosities and no lower extremity edema present bilateral.   Neruologic: Grossly intact via light touch bilateral. Vibratory intact via tuning fork bilateral. Protective threshold with Semmes Wienstein monofilament intact to all pedal sites bilateral. Patellar and Achilles deep tendon reflexes 2+ bilateral. No Babinski or clonus noted bilateral.   Musculoskeletal: No gross boney pedal deformities bilateral. No pain, crepitus, or limitation noted with foot and ankle range of motion bilateral. Muscular strength 5/5 in all groups tested bilateral.  Pain on palpation and range of motion of the second metatarsophalangeal joints bilaterally right worse than left  Gait: Unassisted, Nonantalgic.    Radiographs  Radiographs taken today demonstrate osseously mature individual hallux valgus deformities bilateral.  Slightly elongated second metatarsal.  Assessment & Plan:   Assessment: Capsulitis second metatarsophalangeal joint bilateral.  Benign soft tissue lesion plantar aspect forefoot right  Plan: Discussed appropriate shoe gear.  Etiology pathology conservative surgical therapies were discussed.  We injected the second metatarsophalangeal joint area today dorsally with 10 mg of Kenalog and local anesthetic.  I debrided the benign skin lesion follow-up with her 6 weeks     [name] T. [place], [place]

## 2023-09-03 ENCOUNTER — Encounter: Payer: Self-pay | Admitting: Internal Medicine

## 2023-09-03 MED ORDER — CYANOCOBALAMIN 1000 MCG/ML IJ SOLN: 1000.0000 ug | INTRAMUSCULAR | 2 refills | Status: DC

## 2023-09-03 MED ORDER — "SYRINGE 25G X 1"" 3 ML MISC": 3 refills | Status: DC

## 2023-09-03 NOTE — Telephone Encounter (Signed)
B12 with syringes and needles have been sent to [facility]. Pt is still needing a refill on Tramadol.  Refilled: historical medication Last OV: 08/07/2023 Next OV: not scheduled

## 2023-09-03 NOTE — Telephone Encounter (Signed)
Sent mychart message

## 2023-09-04 ENCOUNTER — Ambulatory Visit
Admission: RE | Admit: 2023-09-04 | Discharge: 2023-09-04 | Disposition: A | Discharge status: Home / Self Care | Payer: PPO | Source: Ambulatory Visit | Attending: Internal Medicine | Admitting: Internal Medicine

## 2023-09-04 DIAGNOSIS — Z1231 Encounter for screening mammogram for malignant neoplasm of breast: Secondary | ICD-10-CM | POA: Diagnosis not present

## 2023-09-05 MED ORDER — TRAMADOL HCL 50 MG PO TABS: 50.0000 mg | ORAL_TABLET | Freq: Every day | ORAL | 5 refills | Status: DC

## 2023-09-24 ENCOUNTER — Encounter: Payer: PPO | Admitting: Gastroenterology

## 2023-10-14 ENCOUNTER — Ambulatory Visit: Payer: PPO | Admitting: Podiatry

## 2023-10-14 ENCOUNTER — Encounter: Payer: Self-pay | Admitting: Podiatry

## 2023-10-14 DIAGNOSIS — M778 Other enthesopathies, not elsewhere classified: Secondary | ICD-10-CM

## 2023-10-14 NOTE — Progress Notes (Signed)
She presents today for follow-up of her capsulitis second metatarsophalangeal joint right foot.  She states that the pain is gone but is still have some numbness.  Objective: There is no erythema edema salines drainage or odor she has no reproducible pain on palpation of the second metatarsal phalangeal joint.  Assessment: Well-healing capsulitis second metatarsophalangeal joint neuritis persists.  Plan: Follow-up with her as on an as-needed basis.  Explaining that the nerve deprivation may always be present.

## 2023-10-21 ENCOUNTER — Other Ambulatory Visit: Payer: Self-pay | Admitting: Internal Medicine

## 2023-11-12 ENCOUNTER — Encounter: Payer: Self-pay | Admitting: Gastroenterology

## 2023-11-12 ENCOUNTER — Ambulatory Visit: Payer: PPO | Admitting: Gastroenterology

## 2023-11-12 VITALS — BP 120/62 | HR 68 | Ht 62.0 in | Wt 122.0 lb

## 2023-11-12 DIAGNOSIS — Z860101 Personal history of adenomatous and serrated colon polyps: Secondary | ICD-10-CM | POA: Diagnosis not present

## 2023-11-12 DIAGNOSIS — I671 Cerebral aneurysm, nonruptured: Secondary | ICD-10-CM | POA: Diagnosis not present

## 2023-11-12 DIAGNOSIS — K219 Gastro-esophageal reflux disease without esophagitis: Secondary | ICD-10-CM | POA: Diagnosis not present

## 2023-11-12 DIAGNOSIS — Z7902 Long term (current) use of antithrombotics/antiplatelets: Secondary | ICD-10-CM

## 2023-11-12 DIAGNOSIS — K760 Fatty (change of) liver, not elsewhere classified: Secondary | ICD-10-CM

## 2023-11-12 NOTE — Patient Instructions (Signed)
We have changed your colonoscopy recall to 03/2025. We will send you a letter when it gets closer to that time. If you decide you want to have your colonoscopy sooner then contact our office.  The [facility] providers would like to encourage you to use [facility] to communicate with providers for non-urgent requests or questions.  Due to long hold times on the telephone, sending your provider a message by [facility] may be a faster and more efficient way to get a response.  Please allow 48 business hours for a response.  Please remember that this is for non-urgent requests.   Thank you for choosing me and [facility].  Venita Lick. Pleas Koch., MD., Clementeen Graham

## 2023-11-12 NOTE — Progress Notes (Signed)
Assessment    Personal history of adenomatous colon polyps GERD, controlled Hepatic steatosis Brain aneurysm S/P embolization on Brilinta   Recommendations   Change polyp surveillance interval to 7 years in April 2026. If she decides to proceed earlier that is fine too since the initial recommendation was for 5 years which she passed in April.  Continue Prevacid 30 mg daily and follow antireflux measures. When she proceeds with colonoscopy hold Brilinta 5 days before procedure - will instruct when and how to resume after procedure. Low but real risk of cardiovascular event such as heart attack, stroke, embolism, thrombosis or ischemia/infarct of other organs off Brilinta explained and need to seek urgent help if this occurs. The patient consents to proceed. Will communicate by phone or EMR with patient's prescribing provider to confirm that holding Brilinta is reasonable in this case.     HPI   Chief complaint: Personal history of adenomatous colon polyps  Patient profile:  Katrina Woods is a 67 y.o. female referred by Sherlene Shams, MD for personal history of adenomatous colon polyps.  She was initially recommended for a 5-year interval surveillance colonoscopy.  She returns today with no gastrointestinal complaints to discuss surveillance colonoscopy.  She is maintained on Brilinta for a brain aneurysm S/P embolization.  We discussed the practice guidelines colonoscopy surveillance interval recommendation changes to 7 to 10 years in her situation.  Her reflux symptoms are well-controlled on her current regimen.  Denies weight loss, abdominal pain, constipation, diarrhea, change in stool caliber, melena, hematochezia, nausea, vomiting, dysphagia, chest pain.   Previous Labs / Imaging::    Latest Ref Rng & Units 08/07/2023    9:57 AM 11/05/2022    8:54 AM 11/24/2021    4:43 AM  CBC  WBC 4.0 - 10.5 K/uL 7.1  8.4  11.2   Hemoglobin 12.0 - 15.0 g/dL 41.3  24.4  01.0    Hematocrit 36.0 - 46.0 % 44.4  43.7  34.4   Platelets 150.0 - 400.0 K/uL 262.0  279.0  PLATELET CLUMPS NOTED ON SMEAR, UNABLE TO ESTIMATE     No results found for: "LIPASE"    Latest Ref Rng & Units 08/07/2023    9:57 AM 11/05/2022    8:54 AM 11/24/2021    4:43 AM  CMP  Glucose 70 - 99 mg/dL 97  98  97   BUN 6 - 23 mg/dL 11  13  17    Creatinine 0.40 - 1.20 mg/dL 2.72  5.36  6.44   Sodium 135 - 145 mEq/L 140  136  136   Potassium 3.5 - 5.1 mEq/L 4.3  4.5  4.3   Chloride 96 - 112 mEq/L 106  100  109   CO2 19 - 32 mEq/L 27  26  21    Calcium 8.4 - 10.5 mg/dL 03.4  9.8  8.9   Total Protein 6.0 - 8.3 g/dL 6.6  6.8    Total Bilirubin 0.2 - 1.2 mg/dL 0.4  0.4    Alkaline Phos 39 - 117 U/L 57  58    AST 0 - 37 U/L 14  15    ALT 0 - 35 U/L 6  6       Previous GI evaluation    Endoscopies:  Colonoscopy April 2019 - Two 6 to 7 mm polyps in the sigmoid colon, removed with a cold snare. Resected and retrieved.  - Internal hemorrhoids.  - The examination was otherwise normal on direct and retroflexion views.  Path: Tubular adenomas  Imaging:     Past Medical History:  Diagnosis Date   Allergy    Aneurysm (HCC)    cerebral   Back pain    Bursitis of left hip 07/2012   takes Meloxicam daily    Depression    takes Lexapro daily   GERD (gastroesophageal reflux disease)    takes Omeprazole daily   Headache    Hiatal hernia 01/2008   History of bronchitis 2015   History of colon polyps    benign   History of kidney stones    History of shingles    Hyperlipidemia    takes Atorvastatin daily   Insomnia    takes Xanax nightly   Sleep apnea    can not tolerate Cpap   Weakness    numbness and tingling-thinks its carpal tunnel   Past Surgical History:  Procedure Laterality Date   ABDOMINAL HYSTERECTOMY  2004   total   CEREBRAL ANEURYSM REPAIR  2006   Deveshwar, coil    CESAREAN SECTION  11/25/1982   COLONOSCOPY     DILATION AND CURETTAGE OF UTERUS      ESOPHAGOGASTRODUODENOSCOPY     IR 3D INDEPENDENT WKST  11/23/2021   IR ANGIO VERTEBRAL SEL VERTEBRAL UNI R MOD SED  11/23/2021   IR CT HEAD LTD  11/23/2021   IR GENERIC HISTORICAL  10/23/2016   IR RADIOLOGIST EVAL & MGMT 10/23/2016 [facility]   IR RADIOLOGIST EVAL & MGMT  09/18/2021   IR RADIOLOGIST EVAL & MGMT  12/18/2021   IR TRANSCATH/EMBOLIZ  11/23/2021   IR US GUIDE VASC ACCESS RIGHT  11/23/2021   KNEE CARTILAGE SURGERY Left 1987   POLYPECTOMY     RADIOLOGY WITH ANESTHESIA N/A 05/11/2015   Procedure: ANEURYSM EMBOLIZATION;  Surgeon: Julieanne Cotton, MD;  [facility];  Service: Radiology;  Laterality: N/A;   RADIOLOGY WITH ANESTHESIA N/A 11/23/2021   Procedure: IR WITH ANESTHESIA EMBOLIZATION;  Surgeon: Julieanne Cotton, MD;  Location[facility];  Service: Radiology;  Laterality: N/A;   WISDOM TOOTH EXTRACTION  1986   all four extracted   Family History  Problem Relation Age of Onset   Diabetes Mother        type 2   Hypertension Mother    Heart disease Mother        CHF   Cancer Mother        lung- previous smoker- had quit 12 yrs before   Appendicitis Father        acute   Cancer Father        prostate?   Breast cancer Maternal Grandmother        88s   Cancer Maternal Grandmother        breast, uterine   Hypertension Maternal Grandmother    Heart disease Maternal Grandmother        CHF   Diabetes Maternal Grandmother    Stroke Maternal Grandfather    Heart block Paternal Grandfather        massive   Diabetes Brother 81       type 2   Hypertension Brother    Colon cancer Neg Hx    Stomach cancer Neg Hx    Colon polyps Neg Hx    Esophageal cancer Neg Hx    Rectal cancer Neg Hx    Social History   Tobacco Use   Smoking status: Former    Types: Cigarettes   Smokeless tobacco: Never   Tobacco comments:  quit smoking in Nov 2015  Vaping Use   Vaping status: Some Days  Substance Use Topics   Alcohol use: Yes    Alcohol/week: 3.0 standard drinks of  alcohol    Types: 3 Glasses of wine per week    Comment: occasional wine   Drug use: No   Current Outpatient Medications  Medication Sig Dispense Refill   cyanocobalamin (VITAMIN B12) 1000 MCG/ML injection Inject 1 mL (1,000 mcg total) into the muscle every 30 (thirty) days. 3 mL 2   escitalopram (LEXAPRO) 20 MG tablet TAKE 1 TABLET BY MOUTH EVERY DAY 90 tablet 1   ezetimibe (ZETIA) 10 MG tablet TAKE 1 TABLET BY MOUTH EVERY DAY 90 tablet 3   lansoprazole (PREVACID) 30 MG capsule TAKE 1 CAPSULE BY MOUTH TWICE DAILY BEFORE A MEAL 60 capsule 2   Syringe/Needle, Disp, (SYRINGE 3CC/25GX1") 25G X 1" 3 ML MISC Use to give b12 injections. 3 each 3   ticagrelor (BRILINTA) 90 MG TABS tablet Take 1 tablet (90 mg total) by mouth 2 (two) times daily. 60 tablet 6   traMADol (ULTRAM) 50 MG tablet Take 1 tablet (50 mg total) by mouth daily at 4 PM. For back pain 30 tablet 5   No current facility-administered medications for this visit.   Facility-Administered Medications Ordered in Other Visits  Medication Dose Route Frequency Provider Last Rate Last Admin   aspirin tablet 325 mg  325 mg Oral Once Boisseau, Hayley, PA       Allergies  Allergen Reactions   Etodolac     Unknown reaction   Pollen Extract     Other reaction(s): Unknown   Sertraline     Agitation    Statins     Muscle aches   Wellbutrin [Bupropion]     intolerance    Review of Systems: All other systems reviewed and negative except where noted in HPI.    Physical Exam    Wt Readings from Last 3 Encounters:  11/12/23 122 lb (55.3 kg)  08/07/23 111 lb (50.3 kg)  02/06/23 117 lb 6.4 oz (53.3 kg)    Ht 5\' 2"  (1.575 m)   Wt 122 lb (55.3 kg)   BMI 22.31 kg/m  Constitutional:  Generally well appearing female in no acute distress. HEENT: Pupils normal.  Conjunctivae are normal. No scleral icterus. No oral lesions or deformities noted.  Neck: Supple.  Cardiac: Normal rate, regular rhythm without murmurs. Pulmonary/chest:  Effort normal and breath sounds normal. No wheezing, rales or rhonchi. Abdominal: Soft, nondistended, nontender. Active bowel sounds. No palpable HSM, masses or hernias. Rectal: Not done Extremities: No edema or deformities noted Neurological: Alert and oriented to person, place and time. Psychiatric: Pleasant. Normal mood and affect. Behavior is normal. Skin: Skin is warm and dry. No rashes noted.  Claudette Head, MD   cc:  Referring Provider Sherlene Shams, MD

## 2023-12-16 ENCOUNTER — Telehealth: Payer: Self-pay | Admitting: Internal Medicine

## 2023-12-16 NOTE — Telephone Encounter (Signed)
 Copied from CRM [id number]. Topic: Medicare AWV >> Dec 16, 2023  9:17 AM Nathanel DEL wrote: Reason for CRM: Called LVM 12/16/2023 to schedule AWV. Please schedule office or virtual visits.  Nathanel Paschal; Care Guide Ambulatory Clinical Support  l [facility] Direct Dial: [phone]

## 2024-01-13 ENCOUNTER — Ambulatory Visit: Payer: Self-pay | Admitting: Internal Medicine

## 2024-01-13 NOTE — Telephone Encounter (Signed)
 Noted

## 2024-01-13 NOTE — Telephone Encounter (Signed)
Copied from CRM [id number]. Topic: Clinical - Red Word Triage >> Jan 13, 2024  3:28 PM Orinda Kenner C wrote: Red Word that prompted transfer to Nurse Triage: Patient [phone] positive for Covid at home test, symptoms are headaches, sore throat, headaches, congestion, low grade fever 99.8, sweats, and feeling weak. Patient denies shortness of breath, wheezing, dizziness, or pain. Would like consult and meds sent to [facility] - [place], [place] -  [address] [place] [address] Phone: [phone] Fax: [phone]   Chief Complaint: Covid positive Symptoms: headache, body aches, weak,  Frequency: constant Pertinent Negatives: Patient denies cough or shortness of breath Disposition: [] ED /[] Urgent Care (no appt availability in office) / [x] Appointment(In office/virtual)/ []  [place] [facility]/ [] Home Care/ [] Refused Recommended Disposition /[] [place] [facility]/ []  Follow-up with PCP Additional Notes: Patient just returning from cruise over the weekend, started having symptoms yesterday, tested positive for COVID today at home and was positive. Pt is wanting to be seen to possibly get medication so symptoms dont progress.    Reason for Disposition  [1] COVID-19 diagnosed by positive lab test (e.g., PCR, rapid self-test kit) AND [2] mild symptoms (e.g., cough, fever, others) AND [3] no complications or SOB  Answer Assessment - Initial Assessment Questions 1. COVID-19 DIAGNOSIS: "How do you know that you have COVID?" (e.g., positive lab test or self-test, diagnosed by doctor or NP/PA, symptoms after exposure).     Took home test  2. COVID-19 EXPOSURE: "Was there any known exposure to COVID before the symptoms began?" CDC Definition of close contact: within 6 feet (2 meters) for a total of 15 minutes or more over a 24-hour period.      Unsure, just returned from cruise 2 days  3. ONSET: "When did the COVID-19 symptoms start?"      Symptoms started yesterday  4. WORST SYMPTOM:  "What is your worst symptom?" (e.g., cough, fever, shortness of breath, muscle aches)     Headache,  5. COUGH: "Do you have a cough?" If Yes, ask: "How bad is the cough?"       Occasional cough  6. FEVER: "Do you have a fever?" If Yes, ask: "What is your temperature, how was it measured, and when did it start?"     Low-grades  7. RESPIRATORY STATUS: "Describe your breathing?" (e.g., normal; shortness of breath, wheezing, unable to speak)      Normal   8. BETTER-SAME-WORSE: "Are you getting better, staying the same or getting worse compared to yesterday?"  If getting worse, ask, "In what way?"     Getting worse  9. OTHER SYMPTOMS: "Do you have any other symptoms?"  (e.g., chills, fatigue, headache, loss of smell or taste, muscle pain, sore throat)     Headache, sore throat, congestion, weakness  10. HIGH RISK DISEASE: "Do you have any chronic medical problems?" (e.g., asthma, heart or lung disease, weak immune system, obesity, etc.)       No  11. VACCINE: "Have you had the COVID-19 vaccine?" If Yes, ask: "Which one, how many shots, when did you get it?"       Yes, just had the first shot  12. PREGNANCY: "Is there any chance you are pregnant?" "When was your last menstrual period?"       N/a  13. O2 SATURATION MONITOR:  "Do you use an oxygen saturation monitor (pulse oximeter) at home?" If Yes, ask "What is your reading (oxygen level) today?" "What is your usual oxygen saturation reading?" (e.g., 95%)  No  Protocols used: Coronavirus (COVID-19) Diagnosed or Suspected-A-AH

## 2024-01-14 ENCOUNTER — Encounter: Payer: Self-pay | Admitting: Family Medicine

## 2024-01-14 ENCOUNTER — Telehealth (INDEPENDENT_AMBULATORY_CARE_PROVIDER_SITE_OTHER): Payer: PPO | Admitting: Family Medicine

## 2024-01-14 VITALS — Ht 62.0 in | Wt 120.0 lb

## 2024-01-14 DIAGNOSIS — U071 COVID-19: Secondary | ICD-10-CM | POA: Diagnosis not present

## 2024-01-14 MED ORDER — MOLNUPIRAVIR EUA 200MG CAPSULE
4.0000 | ORAL_CAPSULE | Freq: Two times a day (BID) | ORAL | 0 refills | Status: AC
Start: 2024-01-14 — End: 2024-01-19

## 2024-01-14 NOTE — Assessment & Plan Note (Addendum)
 Patient with COVID symptoms and positive home COVID test.  Given her medical history and age we will proceed with COVID-specific treatment.  She is unable to use Paxlovid given that she is on Brilinta .  Will start her on molnupiravir .  Discussed the purpose of molnupiravir  is to reduce her risk of progression to severe illness requiring hospitalization.  Advise she may start to feel better quicker on the molnupiravir  though may not.  Advised to monitor for diarrhea on this medication.  Discussed use of Mucinex to help with her congestion.  Advised against use of Sudafed given her medical history.  Advised if she develops shortness of breath she should go to the emergency room.  If she develops fevers or progressively feels worse she needs to let us  know.  Discussed staying home and away from others until she is feeling better and has been without low-grade fevers for at least 24 hours.  Discussed wearing a mask around those that she lives with.

## 2024-01-14 NOTE — Progress Notes (Signed)
 Virtual Visit via video Note  I connected with Katrina Woods today at  9:00 AM EST by a video enabled telemedicine application or telephone and verified that I am speaking with the correct person using two identifiers. Location patient: home Location provider: work Persons participating in the virtual visit: patient, provider  I discussed the limitations, risks, security and privacy concerns of performing an evaluation and management service by telephone and the availability of in person appointments. I also discussed with the patient that there may be a patient responsible charge related to this service. The patient expressed understanding and agreed to proceed.  Reason for visit: COVID  HPI: Patient notes onset of symptoms 3 days ago.  Noted fatigue.  Then developed nasal congestion and sinus congestion.  Reports low-grade fevers in the evening.  Some mild cough.  Some headache.  No shortness of breath.  Does have some sore throat.  She is use Tylenol  with mild help for her discomfort.  Notes she was traveling on a cruise just before developing symptoms.   ROS: See pertinent positives and negatives per HPI.  Past Medical History:  Diagnosis Date   Allergy    Aneurysm (HCC)    cerebral   Back pain    Bursitis of left hip 07/2012   takes Meloxicam  daily    Depression    takes Lexapro  daily   GERD (gastroesophageal reflux disease)    takes Omeprazole  daily   Headache    Hiatal hernia 01/2008   History of bronchitis 2015   History of colon polyps    benign   History of kidney stones    History of shingles    Hyperlipidemia    takes Atorvastatin  daily   Insomnia    takes Xanax  nightly   Sleep apnea    can not tolerate Cpap   Weakness    numbness and tingling-thinks its carpal tunnel    Past Surgical History:  Procedure Laterality Date   ABDOMINAL HYSTERECTOMY  2004   total   CEREBRAL ANEURYSM REPAIR  2006   Deveshwar, coil    CESAREAN SECTION  11/25/1982    COLONOSCOPY     DILATION AND CURETTAGE OF UTERUS     ESOPHAGOGASTRODUODENOSCOPY     IR 3D INDEPENDENT WKST  11/23/2021   IR ANGIO VERTEBRAL SEL VERTEBRAL UNI R MOD SED  11/23/2021   IR CT HEAD LTD  11/23/2021   IR GENERIC HISTORICAL  10/23/2016   IR RADIOLOGIST EVAL & MGMT 10/23/2016 [facility]   IR RADIOLOGIST EVAL & MGMT  09/18/2021   IR RADIOLOGIST EVAL & MGMT  12/18/2021   IR TRANSCATH/EMBOLIZ  11/23/2021   IR US  GUIDE VASC ACCESS RIGHT  11/23/2021   KNEE CARTILAGE SURGERY Left 1987   POLYPECTOMY     RADIOLOGY WITH ANESTHESIA N/A 05/11/2015   Procedure: ANEURYSM EMBOLIZATION;  Surgeon: Thyra Nash, MD;  Location[facility];  Service: Radiology;  Laterality: N/A;   RADIOLOGY WITH ANESTHESIA N/A 11/23/2021   Procedure: IR WITH ANESTHESIA EMBOLIZATION;  Surgeon: Nash Thyra, MD;  Location[facility];  Service: Radiology;  Laterality: N/A;   WISDOM TOOTH EXTRACTION  1986   all four extracted    Family History  Problem Relation Age of Onset   Diabetes Mother        type 2   Hypertension Mother    Heart disease Mother        CHF   Cancer Mother        lung- previous smoker- had  quit 12 yrs before   Appendicitis Father        acute   Cancer Father        prostate?   Breast cancer Maternal Grandmother        80s   Cancer Maternal Grandmother        breast, uterine   Hypertension Maternal Grandmother    Heart disease Maternal Grandmother        CHF   Diabetes Maternal Grandmother    Stroke Maternal Grandfather    Heart block Paternal Grandfather        massive   Diabetes Brother 82       type 2   Hypertension Brother    Colon cancer Neg Hx    Stomach cancer Neg Hx    Colon polyps Neg Hx    Esophageal cancer Neg Hx    Rectal cancer Neg Hx     SOCIAL HX: Former smoker   Current Outpatient Medications:    cyanocobalamin  (VITAMIN B12) 1000 MCG/ML injection, Inject 1 mL (1,000 mcg total) into the muscle every 30 (thirty) days., Disp: 3 mL, Rfl: 2    escitalopram  (LEXAPRO ) 20 MG tablet, TAKE 1 TABLET BY MOUTH EVERY DAY, Disp: 90 tablet, Rfl: 1   ezetimibe  (ZETIA ) 10 MG tablet, TAKE 1 TABLET BY MOUTH EVERY DAY, Disp: 90 tablet, Rfl: 3   lansoprazole  (PREVACID ) 30 MG capsule, TAKE 1 CAPSULE BY MOUTH TWICE DAILY BEFORE A MEAL, Disp: 60 capsule, Rfl: 2   molnupiravir  EUA (LAGEVRIO ) 200 mg CAPS capsule, Take 4 capsules (800 mg total) by mouth 2 (two) times daily for 5 days., Disp: 40 capsule, Rfl: 0   Syringe/Needle, Disp, (SYRINGE 3CC/25GX1) 25G X 1 3 ML MISC, Use to give b12 injections., Disp: 3 each, Rfl: 3   ticagrelor  (BRILINTA ) 90 MG TABS tablet, Take 1 tablet (90 mg total) by mouth 2 (two) times daily., Disp: 60 tablet, Rfl: 6   traMADol  (ULTRAM ) 50 MG tablet, Take 1 tablet (50 mg total) by mouth daily at 4 PM. For back pain, Disp: 30 tablet, Rfl: 5 No current facility-administered medications for this visit.  Facility-Administered Medications Ordered in Other Visits:    aspirin  tablet 325 mg, 325 mg, Oral, Once, Boisseau, Hayley, PA  EXAM:  VITALS per patient if applicable:  GENERAL: alert, oriented, appears tired, in no acute distress  HEENT: atraumatic, conjunttiva clear, no obvious abnormalities on inspection of external nose and ears  NECK: normal movements of the head and neck  LUNGS: on inspection no signs of respiratory distress, breathing rate appears normal, no obvious gross SOB, gasping or wheezing  CV: no obvious cyanosis  MS: moves all visible extremities without noticeable abnormality  PSYCH/NEURO: pleasant and cooperative, no obvious depression or anxiety, speech and thought processing grossly intact  ASSESSMENT AND PLAN:  Discussed the following assessment and plan:  Problem List Items Addressed This Visit     COVID-19 - Primary   Patient with COVID symptoms and positive home COVID test.  Given her medical history and age we will proceed with COVID-specific treatment.  She is unable to use Paxlovid given  that she is on Brilinta .  Will start her on molnupiravir .  Discussed the purpose of molnupiravir  is to reduce her risk of progression to severe illness requiring hospitalization.  Advise she may start to feel better quicker on the molnupiravir  though may not.  Advised to monitor for diarrhea on this medication.  Discussed use of Mucinex to help with her congestion.  Advised against use of Sudafed given her medical history.  Advised if she develops shortness of breath she should go to the emergency room.  If she develops fevers or progressively feels worse she needs to let us  know.  Discussed staying home and away from others until she is feeling better and has been without low-grade fevers for at least 24 hours.  Discussed wearing a mask around those that she lives with.      Relevant Medications   molnupiravir  EUA (LAGEVRIO ) 200 mg CAPS capsule    Return if symptoms worsen or fail to improve.   I discussed the assessment and treatment plan with the patient. The patient was provided an opportunity to ask questions and all were answered. The patient agreed with the plan and demonstrated an understanding of the instructions.   The patient was advised to call back or seek an in-person evaluation if the symptoms worsen or if the condition fails to improve as anticipated.  Camellia Her, MD

## 2024-01-23 DIAGNOSIS — H1045 Other chronic allergic conjunctivitis: Secondary | ICD-10-CM | POA: Diagnosis not present

## 2024-01-23 DIAGNOSIS — H40013 Open angle with borderline findings, low risk, bilateral: Secondary | ICD-10-CM | POA: Diagnosis not present

## 2024-01-23 DIAGNOSIS — H2513 Age-related nuclear cataract, bilateral: Secondary | ICD-10-CM | POA: Diagnosis not present

## 2024-01-23 DIAGNOSIS — H43393 Other vitreous opacities, bilateral: Secondary | ICD-10-CM | POA: Diagnosis not present

## 2024-01-23 DIAGNOSIS — H5203 Hypermetropia, bilateral: Secondary | ICD-10-CM | POA: Diagnosis not present

## 2024-01-27 ENCOUNTER — Telehealth: Payer: Self-pay | Admitting: Internal Medicine

## 2024-01-27 NOTE — Telephone Encounter (Signed)
Copied from CRM [id number]. Topic: Medicare AWV >> Jan 27, 2024 10:43 AM Payton Doughty wrote: Reason for CRM: Called LVM 01/27/2024 to schedule AWV. Please schedule office or virtual visits.  Verlee Rossetti; Care Guide Ambulatory Clinical Support [facility] Direct Dial: [phone]

## 2024-02-27 ENCOUNTER — Other Ambulatory Visit: Payer: Self-pay | Admitting: Internal Medicine

## 2024-04-14 ENCOUNTER — Other Ambulatory Visit: Payer: Self-pay | Admitting: Internal Medicine

## 2024-05-12 DIAGNOSIS — H2513 Age-related nuclear cataract, bilateral: Secondary | ICD-10-CM | POA: Diagnosis not present

## 2024-05-12 DIAGNOSIS — H02834 Dermatochalasis of left upper eyelid: Secondary | ICD-10-CM | POA: Diagnosis not present

## 2024-05-12 DIAGNOSIS — H40013 Open angle with borderline findings, low risk, bilateral: Secondary | ICD-10-CM | POA: Diagnosis not present

## 2024-05-12 DIAGNOSIS — H2511 Age-related nuclear cataract, right eye: Secondary | ICD-10-CM | POA: Diagnosis not present

## 2024-05-12 DIAGNOSIS — H25043 Posterior subcapsular polar age-related cataract, bilateral: Secondary | ICD-10-CM | POA: Diagnosis not present

## 2024-05-22 ENCOUNTER — Other Ambulatory Visit: Payer: Self-pay | Admitting: Internal Medicine

## 2024-06-06 ENCOUNTER — Encounter (HOSPITAL_COMMUNITY): Payer: Self-pay | Admitting: Interventional Radiology

## 2024-06-14 ENCOUNTER — Other Ambulatory Visit: Payer: Self-pay | Admitting: Internal Medicine

## 2024-06-18 ENCOUNTER — Ambulatory Visit: Admitting: Internal Medicine

## 2024-06-18 DIAGNOSIS — Z1211 Encounter for screening for malignant neoplasm of colon: Secondary | ICD-10-CM

## 2024-06-18 DIAGNOSIS — Z1231 Encounter for screening mammogram for malignant neoplasm of breast: Secondary | ICD-10-CM

## 2024-06-18 DIAGNOSIS — R5383 Other fatigue: Secondary | ICD-10-CM

## 2024-06-18 DIAGNOSIS — R7301 Impaired fasting glucose: Secondary | ICD-10-CM

## 2024-06-18 DIAGNOSIS — E785 Hyperlipidemia, unspecified: Secondary | ICD-10-CM

## 2024-06-18 DIAGNOSIS — D510 Vitamin B12 deficiency anemia due to intrinsic factor deficiency: Secondary | ICD-10-CM

## 2024-06-27 ENCOUNTER — Other Ambulatory Visit: Payer: Self-pay | Admitting: Internal Medicine

## 2024-06-30 ENCOUNTER — Telehealth: Payer: Self-pay | Admitting: Internal Medicine

## 2024-06-30 NOTE — Telephone Encounter (Signed)
 Called patient and left message to schedule a physical with Dr Marylynn. Please schedule next available appointment in Sept or Oct.

## 2024-07-06 ENCOUNTER — Ambulatory Visit: Admitting: *Deleted

## 2024-07-06 VITALS — Ht 62.0 in | Wt 120.0 lb

## 2024-07-06 DIAGNOSIS — Z0000 Encounter for general adult medical examination without abnormal findings: Secondary | ICD-10-CM

## 2024-07-06 NOTE — Patient Instructions (Signed)
 Katrina Woods , Thank you for taking time out of your busy schedule to complete your Annual Wellness Visit with me. I enjoyed our conversation and look forward to speaking with you again next year. I, as well as your care team,  appreciate your ongoing commitment to your health goals. Please review the following plan we discussed and let me know if I can assist you in the future. Your Game plan/ To Do List    Referrals: If you haven't heard from the office you've been referred to, please reach out to them at the phone provided.  Consider updating your tetanus and shingles vaccines at your pharmacy.  Follow up Visits: Next Medicare AWV with our clinical staff: 07/09/25 @ 8:50   Have you seen your provider in the last 6 months (3 months if uncontrolled diabetes)? Yes Next Office Visit with your provider: 08/19/24  Clinician Recommendations:  Aim for 30 minutes of exercise or brisk walking, 6-8 glasses of water, and 5 servings of fruits and vegetables each day.       This is a list of the screening recommended for you and due dates:  Health Maintenance  Topic Date Due   Zoster (Shingles) Vaccine (1 of 2) Never done   Colon Cancer Screening  03/27/2023   DTaP/Tdap/Td vaccine (2 - Td or Tdap) 04/16/2023   COVID-19 Vaccine (4 - 2024-25 season) 08/11/2023   Flu Shot  07/10/2024   Mammogram  09/03/2024   Medicare Annual Wellness Visit  07/06/2025   Pneumococcal Vaccine for age over 40  Completed   DEXA scan (bone density measurement)  Completed   Hepatitis C Screening  Completed   Hepatitis B Vaccine  Aged Out   HPV Vaccine  Aged Out   Meningitis B Vaccine  Aged Out    Advanced directives: (Copy Requested) Please bring a copy of your health care power of attorney and living will to the office to be added to your chart at your convenience. You can mail to [facility] [address] [place], [place] [address] or email to [email or web address] Advance Care Planning is important  because it:  [x]  Makes sure you receive the medical care that is consistent with your values, goals, and preferences  [x]  It provides guidance to your family and loved ones and reduces their decisional burden about whether or not they are making the right decisions based on your wishes.

## 2024-07-06 NOTE — Progress Notes (Signed)
 Subjective:   Katrina Woods is a 68 y.o. who presents for a Medicare Wellness preventive visit.  As a reminder, Annual Wellness Visits don't include a physical exam, and some assessments may be limited, especially if this visit is performed virtually. We may recommend an in-person follow-up visit with your provider if needed.  Visit Complete: Virtual I connected with  [name] on 07/06/24 by a audio enabled telemedicine application and verified that I am speaking with the correct person using two identifiers.  Patient Location: Home  Provider Location: Home Office  I discussed the limitations of evaluation and management by telemedicine. The patient expressed understanding and agreed to proceed.  Vital Signs: Because this visit was a virtual/telehealth visit, some criteria may be missing or patient reported. Any vitals not documented were not able to be obtained and vitals that have been documented are patient reported.  VideoDeclined- This patient declined Librarian, academic. Therefore the visit was completed with audio only.  Persons Participating in Visit: Patient.  AWV Questionnaire: No: Patient Medicare AWV questionnaire was not completed prior to this visit.  Cardiac Risk Factors include: advanced age (>26men, >13 women);dyslipidemia;smoking/ tobacco exposure     Objective:    Today's Vitals   07/06/24 1304  Weight: 120 lb (54.4 kg)  Height: 5' 2 (1.575 m)   Body mass index is 21.95 kg/m.     07/06/2024    1:20 PM 09/24/2022    2:40 PM 11/23/2021    1:44 PM 11/22/2021   10:06 AM 03/12/2017    4:17 PM 01/27/2016    7:21 AM 05/11/2015    2:00 PM  Advanced Directives  Does Patient Have a Medical Advance Directive? Yes Yes Yes Yes No  Yes  Yes   Type of Estate agent of [place];Living will Healthcare Power of [place];Living will Healthcare Power of [place];Living will Healthcare Power of  [place];Living will  Healthcare Power of [employer] of [place];Living will   Does patient want to make changes to medical advance directive?  No - Patient declined No - Patient declined No - Patient declined  No - Patient declined  No - Patient declined   Copy of Healthcare Power of Attorney in Chart? No - copy requested No - copy requested No - copy requested No - copy requested  No - copy requested  No - copy requested   Would patient like information on creating a medical advance directive?     No - Patient declined        Data saved with a previous flowsheet row definition    Current Medications (verified) Outpatient Encounter Medications as of 07/06/2024  Medication Sig   cyanocobalamin  (VITAMIN B12) 1000 MCG/ML injection Inject 1 mL (1,000 mcg total) into the muscle every 30 (thirty) days.   escitalopram  (LEXAPRO ) 20 MG tablet TAKE 1 TABLET BY MOUTH ONCE DAILY   ezetimibe  (ZETIA ) 10 MG tablet TAKE 1 TABLET BY MOUTH ONCE DAILY   lansoprazole  (PREVACID ) 30 MG capsule TAKE ONE CAPSULE TWICE A DAY BEFORE MEALS   Syringe/Needle, Disp, (SYRINGE 3CC/25GX1) 25G X 1 3 ML MISC Use to give b12 injections.   ticagrelor  (BRILINTA ) 90 MG TABS tablet Take 1 tablet (90 mg total) by mouth 2 (two) times daily.   traMADol  (ULTRAM ) 50 MG tablet Take 1 tablet (50 mg total) by mouth daily at 4 PM. For back pain (Patient not taking: Reported on 07/06/2024)   Facility-Administered Encounter Medications as of 07/06/2024  Medication   aspirin  tablet 325 mg    Allergies (verified) Etodolac, Pollen extract, Sertraline , Statins, and Wellbutrin  [bupropion ]   History: Past Medical History:  Diagnosis Date   Allergy    Aneurysm (HCC)    cerebral   Back pain    Bursitis of left hip 07/2012   takes Meloxicam  daily    Depression    takes Lexapro  daily   GERD (gastroesophageal reflux disease)    takes Omeprazole  daily   Headache    Hiatal hernia 01/2008   History of bronchitis 2015    History of colon polyps    benign   History of kidney stones    History of shingles    Hyperlipidemia    takes Atorvastatin  daily   Insomnia    takes Xanax  nightly   Sleep apnea    can not tolerate Cpap   Weakness    numbness and tingling-thinks its carpal tunnel   Past Surgical History:  Procedure Laterality Date   ABDOMINAL HYSTERECTOMY  2004   total   CEREBRAL ANEURYSM REPAIR  2006   Deveshwar, coil    CESAREAN SECTION  11/25/1982   COLONOSCOPY     DILATION AND CURETTAGE OF UTERUS     ESOPHAGOGASTRODUODENOSCOPY     IR 3D INDEPENDENT WKST  11/23/2021   IR ANGIO VERTEBRAL SEL VERTEBRAL UNI R MOD SED  11/23/2021   IR CT HEAD LTD  11/23/2021   IR GENERIC HISTORICAL  10/23/2016   IR RADIOLOGIST EVAL & MGMT 10/23/2016 [facility]   IR RADIOLOGIST EVAL & MGMT  09/18/2021   IR RADIOLOGIST EVAL & MGMT  12/18/2021   IR TRANSCATH/EMBOLIZ  11/23/2021   IR US  GUIDE VASC ACCESS RIGHT  11/23/2021   KNEE CARTILAGE SURGERY Left 1987   POLYPECTOMY     RADIOLOGY WITH ANESTHESIA N/A 05/11/2015   Procedure: ANEURYSM EMBOLIZATION;  Surgeon: Thyra Nash, MD;  Location: [facility] OR;  Service: Radiology;  Laterality: N/A;   RADIOLOGY WITH ANESTHESIA N/A 11/23/2021   Procedure: IR WITH ANESTHESIA EMBOLIZATION;  Surgeon: Nash Thyra, MD;  Location: [facility] OR;  Service: Radiology;  Laterality: N/A;   WISDOM TOOTH EXTRACTION  1986   all four extracted   Family History  Problem Relation Age of Onset   Diabetes Mother        type 2   Hypertension Mother    Heart disease Mother        CHF   Cancer Mother        lung- previous smoker- had quit 12 yrs before   Appendicitis Father        acute   Cancer Father        prostate?   Breast cancer Maternal Grandmother        42s   Cancer Maternal Grandmother        breast, uterine   Hypertension Maternal Grandmother    Heart disease Maternal Grandmother        CHF   Diabetes Maternal Grandmother    Stroke Maternal Grandfather    Heart  block Paternal Grandfather        massive   Diabetes Brother 20       type 2   Hypertension Brother    Colon cancer Neg Hx    Stomach cancer Neg Hx    Colon polyps Neg Hx    Esophageal cancer Neg Hx    Rectal cancer Neg Hx    Social History   Socioeconomic History   Marital status: Single  Spouse name: Not on file   Number of children: Not on file   Years of education: Not on file   Highest education level: Associate degree: academic program  Occupational History   Not on file  Tobacco Use   Smoking status: Former    Types: Cigarettes   Smokeless tobacco: Never   Tobacco comments:    quit smoking in Nov 2015  Vaping Use   Vaping status: Some Days  Substance and Sexual Activity   Alcohol use: Yes    Alcohol/week: 3.0 standard drinks of alcohol    Types: 3 Glasses of wine per week    Comment: occasional wine   Drug use: No   Sexual activity: Not Currently  Other Topics Concern   Not on file  Social History Narrative   Not on file   Social Drivers of Health   Financial Resource Strain: Low Risk  (07/06/2024)   Overall Financial Resource Strain (CARDIA)    Difficulty of Paying Living Expenses: Not hard at all  Food Insecurity: No Food Insecurity (07/06/2024)   Hunger Vital Sign    Worried About Running Out of Food in the Last Year: Never true    Ran Out of Food in the Last Year: Never true  Transportation Needs: No Transportation Needs (07/06/2024)   PRAPARE - Administrator, Civil Service (Medical): No    Lack of Transportation (Non-Medical): No  Physical Activity: Inactive (07/06/2024)   Exercise Vital Sign    Days of Exercise per Week: 0 days    Minutes of Exercise per Session: 0 min  Stress: Stress Concern Present (07/06/2024)   [employer] of Occupational Health - Occupational Stress Questionnaire    Feeling of Stress: To some extent  Social Connections: Socially Integrated (07/06/2024)   Social Connection and Isolation Panel    Frequency  of Communication with Friends and Family: More than three times a week    Frequency of Social Gatherings with Friends and Family: More than three times a week    Attends Religious Services: More than 4 times per year    Active Member of [employer] or Organizations: Yes    Attends Banker Meetings: More than 4 times per year    Marital Status: Living with partner    Tobacco Counseling Counseling given: Not Answered Tobacco comments: quit smoking in Nov 2015    Clinical Intake:  Pre-visit preparation completed: Yes  Pain : No/denies pain     BMI - recorded: 21.95 Nutritional Status: BMI of 19-24  Normal Nutritional Risks: None Diabetes: No  Lab Results  Component Value Date   HGBA1C 5.7 08/07/2023   HGBA1C 5.8 11/05/2022   HGBA1C 5.8 10/20/2021     How often do you need to have someone help you when you read instructions, pamphlets, or other written materials from your doctor or pharmacy?: 1 - Never  Interpreter Needed?: No  Information entered by :: R. Phoua Hoadley LPN   Activities of Daily Living     07/06/2024    1:08 PM  In your present state of health, do you have any difficulty performing the following activities:  Hearing? 0  Vision? 0  Comment glasses  Difficulty concentrating or making decisions? 0  Walking or climbing stairs? 0  Dressing or bathing? 0  Doing errands, shopping? 0  Preparing Food and eating ? N  Using the Toilet? N  In the past six months, have you accidently leaked urine? N  Do you have problems with  loss of bowel control? N  Managing your Medications? N  Managing your Finances? N  Housekeeping or managing your Housekeeping? N    Patient Care Team: Marylynn Verneita CROME, MD as PCP - General (Internal Medicine) Marylynn Verneita CROME, MD (Internal Medicine)  I have updated your Care Teams any recent Medical Services you may have received from other providers in the past year.     Assessment:   This is a routine wellness examination for  Dashonna.  Hearing/Vision screen Hearing Screening - Comments:: No issues Vision Screening - Comments:: glasses   Goals Addressed             This Visit's Progress    Patient Stated       Wants to be more active       Depression Screen     07/06/2024    1:14 PM 01/14/2024    8:58 AM 08/07/2023    9:08 AM 02/06/2023    8:08 AM 11/05/2022    8:58 AM 11/05/2022    8:13 AM 09/24/2022    2:41 PM  PHQ 2/9 Scores  PHQ - 2 Score 0 0 2 2 3 3  0  PHQ- 9 Score 3  8 6 10       Fall Risk     07/06/2024    1:10 PM 08/07/2023    9:08 AM 02/06/2023    8:08 AM 11/05/2022    8:12 AM 09/24/2022    2:45 PM  Fall Risk   Falls in the past year? 0 0 0 0 0  Number falls in past yr: 0 0 0 0 0  Injury with Fall? 0 0 0 0 0  Risk for fall due to : No Fall Risks No Fall Risks No Fall Risks No Fall Risks No Fall Risks  Follow up Falls evaluation completed;Falls prevention discussed Falls evaluation completed Falls evaluation completed Falls evaluation completed  Falls evaluation completed      Data saved with a previous flowsheet row definition    MEDICARE RISK AT HOME:  Medicare Risk at Home Any stairs in or around the home?: Yes If so, are there any without handrails?: No Home free of loose throw rugs in walkways, pet beds, electrical cords, etc?: Yes Adequate lighting in your home to reduce risk of falls?: Yes Life alert?: No Use of a cane, walker or w/c?: No Grab bars in the bathroom?: Yes Shower chair or bench in shower?: Yes Elevated toilet seat or a handicapped toilet?: No  TIMED UP AND GO:  Was the test performed?  No  Cognitive Function: 6CIT completed        07/06/2024    1:21 PM 09/24/2022    2:45 PM  6CIT Screen  What Year? 0 points   What month? 0 points   What time? 0 points   Count back from 20 0 points   Months in reverse 0 points 0 points  Repeat phrase 0 points   Total Score 0 points     Immunizations Immunization History  Administered Date(s) Administered    Fluad Quad(high Dose 65+) 11/05/2022   Influenza Split 09/11/2012   Influenza,inj,Quad PF,6+ Mos 10/09/2013, 10/03/2015, 10/15/2016, 10/14/2017, 10/22/2018, 10/26/2019   Influenza-Unspecified 11/18/2021   PFIZER(Purple Top)SARS-COV-2 Vaccination 01/25/2020, 02/15/2020, 09/07/2020   PNEUMOCOCCAL CONJUGATE-20 10/30/2021   Tdap 04/15/2013    Screening Tests Health Maintenance  Topic Date Due   Zoster Vaccines- Shingrix (1 of 2) Never done   Colonoscopy  03/27/2023   DTaP/Tdap/Td (2 - Td or  Tdap) 04/16/2023   COVID-19 Vaccine (4 - 2024-25 season) 08/11/2023   Medicare Annual Wellness (AWV)  09/25/2023   INFLUENZA VACCINE  07/10/2024   MAMMOGRAM  09/03/2024   Pneumococcal Vaccine: 50+ Years  Completed   DEXA SCAN  Completed   Hepatitis C Screening  Completed   Hepatitis B Vaccines  Aged Out   HPV VACCINES  Aged Out   Meningococcal B Vaccine  Aged Out    Health Maintenance  Health Maintenance Due  Topic Date Due   Zoster Vaccines- Shingrix (1 of 2) Never done   Colonoscopy  03/27/2023   DTaP/Tdap/Td (2 - Td or Tdap) 04/16/2023   COVID-19 Vaccine (4 - 2024-25 season) 08/11/2023   Medicare Annual Wellness (AWV)  09/25/2023   Health Maintenance Items Addressed: Discussed the need to update tetanus and shingles vaccines. Patient declines covid vaccines. Patient stated when she saw her GI doctor he told her that she was okay until next year (2026) to repeat colonoscopy.  Patient was traveling and we lost connection several times.   Additional Screening:  Vision Screening: Recommended annual ophthalmology exams for early detection of glaucoma and other disorders of the eye.  Up to date  [facility] Would you like a referral to an eye doctor? No    Dental Screening: Recommended annual dental exams for proper oral hygiene  Community Resource Referral / Chronic Care Management: CRR required this visit?  No   CCM required this visit?  No   Plan:    I have personally  reviewed and noted the following in the patient's chart:   Medical and social history Use of alcohol, tobacco or illicit drugs  Current medications and supplements including opioid prescriptions. Patient is not currently taking opioid prescriptions. Functional ability and status Nutritional status Physical activity Advanced directives List of other physicians Hospitalizations, surgeries, and ER visits in previous 12 months Vitals Screenings to include cognitive, depression, and falls Referrals and appointments  In addition, I have reviewed and discussed with patient certain preventive protocols, quality metrics, and best practice recommendations. A written personalized care plan for preventive services as well as general preventive health recommendations were provided to patient.   Angeline Fredericks, LPN   [id number]/[id number]   After Visit Summary: (MyChart) Due to this being a telephonic visit, the after visit summary with patients personalized plan was offered to patient via MyChart   Notes: Nothing significant to report at this time.

## 2024-07-13 DIAGNOSIS — H2511 Age-related nuclear cataract, right eye: Secondary | ICD-10-CM | POA: Diagnosis not present

## 2024-07-14 DIAGNOSIS — H2512 Age-related nuclear cataract, left eye: Secondary | ICD-10-CM | POA: Diagnosis not present

## 2024-07-17 ENCOUNTER — Other Ambulatory Visit: Payer: Self-pay | Admitting: Internal Medicine

## 2024-07-20 DIAGNOSIS — H2511 Age-related nuclear cataract, right eye: Secondary | ICD-10-CM | POA: Diagnosis not present

## 2024-07-21 ENCOUNTER — Other Ambulatory Visit (HOSPITAL_COMMUNITY): Payer: Self-pay | Admitting: Radiology

## 2024-07-21 ENCOUNTER — Telehealth (HOSPITAL_COMMUNITY): Payer: Self-pay

## 2024-07-21 DIAGNOSIS — I671 Cerebral aneurysm, nonruptured: Secondary | ICD-10-CM

## 2024-07-21 NOTE — Telephone Encounter (Signed)
 Pt is aware that Dr. Dolphus is gone. Will put in a referral for pt to see Dr. Lanis. AB

## 2024-08-19 ENCOUNTER — Other Ambulatory Visit (HOSPITAL_COMMUNITY): Payer: Self-pay

## 2024-08-19 ENCOUNTER — Ambulatory Visit: Admitting: Internal Medicine

## 2024-08-19 ENCOUNTER — Encounter: Payer: Self-pay | Admitting: Internal Medicine

## 2024-08-19 ENCOUNTER — Telehealth: Payer: Self-pay

## 2024-08-19 VITALS — BP 102/70 | HR 63 | Ht 62.0 in | Wt 117.0 lb

## 2024-08-19 DIAGNOSIS — F418 Other specified anxiety disorders: Secondary | ICD-10-CM

## 2024-08-19 DIAGNOSIS — Z1231 Encounter for screening mammogram for malignant neoplasm of breast: Secondary | ICD-10-CM

## 2024-08-19 DIAGNOSIS — Z0000 Encounter for general adult medical examination without abnormal findings: Secondary | ICD-10-CM

## 2024-08-19 DIAGNOSIS — D649 Anemia, unspecified: Secondary | ICD-10-CM | POA: Diagnosis not present

## 2024-08-19 DIAGNOSIS — Z122 Encounter for screening for malignant neoplasm of respiratory organs: Secondary | ICD-10-CM

## 2024-08-19 DIAGNOSIS — I671 Cerebral aneurysm, nonruptured: Secondary | ICD-10-CM

## 2024-08-19 DIAGNOSIS — Z1211 Encounter for screening for malignant neoplasm of colon: Secondary | ICD-10-CM

## 2024-08-19 DIAGNOSIS — E785 Hyperlipidemia, unspecified: Secondary | ICD-10-CM | POA: Diagnosis not present

## 2024-08-19 DIAGNOSIS — R7301 Impaired fasting glucose: Secondary | ICD-10-CM

## 2024-08-19 DIAGNOSIS — Z720 Tobacco use: Secondary | ICD-10-CM | POA: Diagnosis not present

## 2024-08-19 DIAGNOSIS — R5383 Other fatigue: Secondary | ICD-10-CM | POA: Diagnosis not present

## 2024-08-19 DIAGNOSIS — Z0001 Encounter for general adult medical examination with abnormal findings: Secondary | ICD-10-CM

## 2024-08-19 DIAGNOSIS — G629 Polyneuropathy, unspecified: Secondary | ICD-10-CM | POA: Diagnosis not present

## 2024-08-19 DIAGNOSIS — M542 Cervicalgia: Secondary | ICD-10-CM | POA: Diagnosis not present

## 2024-08-19 DIAGNOSIS — D510 Vitamin B12 deficiency anemia due to intrinsic factor deficiency: Secondary | ICD-10-CM | POA: Diagnosis not present

## 2024-08-19 DIAGNOSIS — E538 Deficiency of other specified B group vitamins: Secondary | ICD-10-CM

## 2024-08-19 LAB — COMPREHENSIVE METABOLIC PANEL WITH GFR
ALT: 9 U/L (ref 0–35)
AST: 17 U/L (ref 0–37)
Albumin: 4.4 g/dL (ref 3.5–5.2)
Alkaline Phosphatase: 56 U/L (ref 39–117)
BUN: 11 mg/dL (ref 6–23)
CO2: 29 meq/L (ref 19–32)
Calcium: 10.5 mg/dL (ref 8.4–10.5)
Chloride: 101 meq/L (ref 96–112)
Creatinine, Ser: 0.88 mg/dL (ref 0.40–1.20)
GFR: 67.36 mL/min (ref >60.00)
Glucose, Bld: 79 mg/dL (ref 70–99)
Potassium: 4.7 meq/L (ref 3.5–5.1)
Sodium: 138 meq/L (ref 135–145)
Total Bilirubin: 0.4 mg/dL (ref 0.2–1.2)
Total Protein: 7.1 g/dL (ref 6.0–8.3)

## 2024-08-19 LAB — CBC WITH DIFFERENTIAL/PLATELET
Basophils Absolute: 0 K/uL (ref 0.0–0.1)
Basophils Relative: 0.6 % (ref 0.0–3.0)
Eosinophils Absolute: 0 K/uL (ref 0.0–0.7)
Eosinophils Relative: 0.5 % (ref 0.0–5.0)
HCT: 42.9 % (ref 36.0–46.0)
Hemoglobin: 14.2 g/dL (ref 12.0–15.0)
Lymphocytes Relative: 22.9 % (ref 12.0–46.0)
Lymphs Abs: 1.7 K/uL (ref 0.7–4.0)
MCHC: 33.1 g/dL (ref 30.0–36.0)
MCV: 94.3 fl (ref 78.0–100.0)
Monocytes Absolute: 0.6 K/uL (ref 0.1–1.0)
Monocytes Relative: 8 % (ref 3.0–12.0)
Neutro Abs: 5.2 K/uL (ref 1.4–7.7)
Neutrophils Relative %: 68 % (ref 43.0–77.0)
Platelets: 276 K/uL (ref 150.0–400.0)
RBC: 4.54 Mil/uL (ref 3.87–5.11)
RDW: 14.8 % (ref 11.5–15.5)
WBC: 7.6 K/uL (ref 4.0–10.5)

## 2024-08-19 LAB — HEMOGLOBIN A1C: Hgb A1c MFr Bld: 5.9 % (ref 4.6–6.5)

## 2024-08-19 LAB — LIPID PANEL
Cholesterol: 230 mg/dL — ABNORMAL HIGH (ref 0–200)
HDL: 72.8 mg/dL (ref >39.00)
LDL Cholesterol: 127 mg/dL — ABNORMAL HIGH (ref 0–99)
NonHDL: 157.28
Total CHOL/HDL Ratio: 3
Triglycerides: 152 mg/dL — ABNORMAL HIGH (ref 0.0–149.0)
VLDL: 30.4 mg/dL (ref 0.0–40.0)

## 2024-08-19 LAB — TSH: TSH: 2.87 u[IU]/mL (ref 0.35–5.50)

## 2024-08-19 LAB — LDL CHOLESTEROL, DIRECT: Direct LDL: 126 mg/dL

## 2024-08-19 MED ORDER — TRAZODONE HCL 50 MG PO TABS: 25.0000 mg | ORAL_TABLET | Freq: Every evening | ORAL | 3 refills | Status: AC | PRN

## 2024-08-19 MED ORDER — TETANUS-DIPHTH-ACELL PERTUSSIS 5-2.5-18.5 LF-MCG/0.5 IM SUSY
0.5000 mL | PREFILLED_SYRINGE | Freq: Once | INTRAMUSCULAR | 0 refills | Status: DC
Start: 2024-08-19 — End: 2024-08-19

## 2024-08-19 MED ORDER — CYANOCOBALAMIN 1000 MCG/ML IJ SOLN: 1000.0000 ug | INTRAMUSCULAR | 2 refills | Status: AC

## 2024-08-19 MED ORDER — EZETIMIBE 10 MG PO TABS: 10.0000 mg | ORAL_TABLET | Freq: Every day | ORAL | 1 refills | Status: DC

## 2024-08-19 MED ORDER — SYRINGE 25G X 1" 3 ML MISC: 3 refills | Status: AC

## 2024-08-19 MED ORDER — TRAMADOL HCL 50 MG PO TABS: 50.0000 mg | ORAL_TABLET | Freq: Every day | ORAL | 5 refills | Status: AC | PRN

## 2024-08-19 MED ORDER — TRAMADOL HCL 50 MG PO TABS: 50.0000 mg | ORAL_TABLET | Freq: Every day | ORAL | 5 refills | Status: DC | PRN

## 2024-08-19 MED ORDER — BUSPIRONE HCL 5 MG PO TABS: 5.0000 mg | ORAL_TABLET | Freq: Two times a day (BID) | ORAL | 2 refills | Status: AC

## 2024-08-19 MED ORDER — TETANUS-DIPHTH-ACELL PERTUSSIS 5-2.5-18.5 LF-MCG/0.5 IM SUSY: 0.5000 mL | PREFILLED_SYRINGE | Freq: Once | INTRAMUSCULAR | 0 refills | Status: AC

## 2024-08-19 NOTE — Patient Instructions (Addendum)
  You are DUE for your tetanus-diptheria-pertussis vaccine   (TDaP)   Please get this done at your pharmacy l;  it will be PAID FOR MY MEDICARE ONLY AT YOUR PHARMACY   I will talk with Dr Clois about recommendations for For Katrina Woods 's replacement    Adding buspirone  to lexapro  TO MANAGE YOUR ANXIETY .  Start with 5 mg twice daily  and increase as needed to 10 mg twice daily.    PLEASE FOLLOW UP IN 3 MONTHS FOR MANAGEMENT OF ANXIETY  Tramadol  refilled for one daily use  BRILINTA  DOSE IS 1 TABLET (90 MG) TWICE DAILY)   LUNG CANCER SCREENING IS RECOMMENDED.  REFERRAL MADE

## 2024-08-19 NOTE — Telephone Encounter (Signed)
 Copied from CRM #[id number]. Topic: Clinical - Prescription Issue >> Aug 19, 2024  1:31 PM Geneva B wrote: Reason for CRM: [facility] got 2 rx  traMADol  (ULTRAM ) 50 MG tablet [[id number]] that are the same and want to know if it was a mistake or not please call back to give them clarity [phone]

## 2024-08-19 NOTE — Progress Notes (Unsigned)
 Patient ID: Katrina Woods, female    DOB: [birth year]  Age: 68 y.o. MRN: [id number]  The patient is here for annual preventive  examination and management of other chronic and acute problems.   The risk factors are reflected in the social history.  The roster of all physicians providing medical care to patient - is listed in the Snapshot section of the chart.  Activities of daily living:  The patient is 100% independent in all ADLs: dressing, toileting, feeding as well as independent mobility  Home safety : The patient has smoke detectors in the home. They wear seatbelts.  There are no firearms at home. There is no violence in the home.   There is no risks for hepatitis, STDs or HIV. There is no   history of blood transfusion. They have no travel history to infectious disease endemic areas of the world.  The patient has seen their dentist in the last six month. They have seen their eye doctor in the last year. They admit to slight hearing difficulty with regard to whispered voices and some television programs.  They have deferred audiologic testing in the last year.  They do not  have excessive sun exposure. Discussed the need for sun protection: hats, long sleeves and use of sunscreen if there is significant sun exposure.   Diet: the importance of a healthy diet is discussed. They do have a healthy diet.  The benefits of regular aerobic exercise were discussed. She walks 4 times per week ,  20 minutes.   Depression screen: there are no signs or vegative symptoms of depression- irritability, change in appetite, anhedonia, sadness/tearfullness.  Cognitive assessment: the patient manages all their financial and personal affairs and is actively engaged. They could relate day,date,year and events; recalled 2/3 objects at 3 minutes; performed clock-face test normally.  The following portions of the patient's history were reviewed and updated as appropriate: allergies, current medications,  past family history, past medical history,  past surgical history, past social history  and problem list.  Visual acuity was not assessed per patient preference since she has regular follow up with her ophthalmologist. Hearing and body mass index were assessed and reviewed.   During the course of the visit the patient was educated and counseled about appropriate screening and preventive services including : fall prevention , diabetes screening, nutrition counseling, colorectal cancer screening, and recommended immunizations.    CC: The primary encounter diagnosis was Encounter for screening mammogram for malignant neoplasm of breast. Diagnoses of Colon cancer screening, Encounter for preventive health examination, Hyperlipidemia LDL goal <100, Anemia, unspecified type, Other fatigue, Impaired fasting glucose, Screening for lung cancer, Aneurysm, cerebral, nonruptured, Depression with anxiety, Neck pain, musculoskeletal, Neuropathy, Tobacco abuse, B12 deficiency, and Pernicious anemia were also pertinent to this visit.  Last seen one year ago.  Did not return for follow up on neuropathy.  Was referred to podiatry and seen by [facility]   Right shoulder pain, chronic,    Anxiety has been worse late due to family stressors  Cerebral aneurysm: s/p stenting by Deveshwar in 2020 and again in 2022. She states that she has been taking a subtherapeutic dose of Brilinta  . Exhaustive review of chart does not confirm the lower dose she states she was told to take.  She has been taking 1/2 tablet twice daily . Which is incorrect.   She states that Dr Dolphus is leaving and she has no follow p   Tobacco abuse:  she was referred for lung  CA screening after August 2024 visit but did not return their calls; referral was closed.   History Katrina Woods has a past medical history of Allergy, Aneurysm (HCC), Anxiety (209), Back pain, Bursitis of left hip (07/2012), Depression, GERD (gastroesophageal reflux disease), Headache,  Hiatal hernia (01/2008), History of bronchitis (2015), History of colon polyps, History of kidney stones, History of shingles, Hyperlipidemia, Insomnia, Pernicious anemia (08/07/2023), Sleep apnea, Syncope and collapse (03/12/2021), and Weakness.   She has a past surgical history that includes Abdominal hysterectomy (2004); Knee cartilage surgery (Left, 1987); Cesarean section (11-25[birth year]82); Wisdom tooth extraction (1986); Cerebral aneurysm repair (2006); Colonoscopy; Esophagogastroduodenoscopy; Radiology with anesthesia (N/A, 05/11/2015); ir generic historical (10/23/2016); Polypectomy; IR Radiologist Eval & Mgmt (09/18/2021); Dilation and curettage of uterus; Radiology with anesthesia (N/A, 11/23/2021); IR Transcath/Emboliz (11/23/2021); IR 3D Independent Wkst (11/23/2021); IR US  Guide Vasc Access Right (11/23/2021); IR ANGIO VERTEBRAL SEL VERTEBRAL UNI R MOD SED (11/23/2021); IR CT Head Ltd (11/23/2021); IR Radiologist Eval & Mgmt (12/18/2021); and Tubal ligation (1987).   Her family history includes Appendicitis in her father; Breast cancer in her maternal grandmother; Cancer in her father, maternal grandmother, and mother; Diabetes in her maternal grandmother and mother; Diabetes (age of onset: 60) in her brother; Heart block in her paternal grandfather; Heart disease in her maternal grandmother and mother; Hypertension in her brother, maternal grandmother, and mother; Stroke in her maternal grandfather.She reports that she has quit smoking. Her smoking use included cigarettes. She has never used smokeless tobacco. She reports current alcohol use of about 3.0 standard drinks of alcohol per week. She reports that she does not use drugs.  Outpatient Medications Prior to Visit  Medication Sig Dispense Refill   escitalopram  (LEXAPRO ) 20 MG tablet TAKE 1 TABLET BY MOUTH ONCE DAILY 90 tablet 1   lansoprazole  (PREVACID ) 30 MG capsule TAKE ONE CAPSULE TWICE A DAY BEFORE MEALS 180 capsule 1   ticagrelor   (BRILINTA ) 90 MG TABS tablet Take 1 tablet (90 mg total) by mouth 2 (two) times daily. 60 tablet 6   traMADol  (ULTRAM ) 50 MG tablet Take 1 tablet (50 mg total) by mouth every 6 (six) hours as needed. 120 tablet 0   cyanocobalamin  (VITAMIN B12) 1000 MCG/ML injection Inject 1 mL (1,000 mcg total) into the muscle every 30 (thirty) days. 3 mL 2   ezetimibe  (ZETIA ) 10 MG tablet TAKE 1 TABLET BY MOUTH ONCE DAILY 60 tablet 0   Syringe/Needle, Disp, (SYRINGE 3CC/25GX1) 25G X 1 3 ML MISC Use to give b12 injections. 3 each 3   traMADol  (ULTRAM ) 50 MG tablet Take 1 tablet (50 mg total) by mouth daily at 4 PM. For back pain (Patient not taking: Reported on 07/06/2024) 30 tablet 5   Facility-Administered Medications Prior to Visit  Medication Dose Route Frequency Provider Last Rate Last Admin   aspirin  tablet 325 mg  325 mg Oral Once Boisseau, Hayley, PA        Review of Systems  Patient denies headache, fevers, malaise, unintentional weight loss, skin rash, eye pain, sinus congestion and sinus pain, sore throat, dysphagia,  hemoptysis , cough, dyspnea, wheezing, chest pain, palpitations, orthopnea, edema, abdominal pain, nausea, melena, diarrhea, constipation, flank pain, dysuria, hematuria, urinary  Frequency, nocturia, numbness, tingling, seizures,  Focal weakness, Loss of consciousness,  Tremor, insomnia, depression, anxiety, and suicidal ideation.     Objective:  BP 102/70   Pulse 63   Ht 5' 2 (1.575 m)   Wt 117 lb (53.1 kg)   SpO2 98%   BMI  21.40 kg/m   Physical Exam Vitals reviewed.  Constitutional:      General: She is not in acute distress.    Appearance: Normal appearance. She is well-developed and normal weight. She is not ill-appearing, toxic-appearing or diaphoretic.  HENT:     Head: Normocephalic.     Right Ear: Tympanic membrane, ear canal and external ear normal. There is no impacted cerumen.     Left Ear: Tympanic membrane, ear canal and external ear normal. There is no  impacted cerumen.     Nose: Nose normal.     Mouth/Throat:     Mouth: Mucous membranes are moist.     Pharynx: Oropharynx is clear.  Eyes:     General: No scleral icterus.       Right eye: No discharge.        Left eye: No discharge.     Conjunctiva/sclera: Conjunctivae normal.     Pupils: Pupils are equal, round, and reactive to light.  Neck:     Thyroid : No thyromegaly.     Vascular: No carotid bruit or JVD.  Cardiovascular:     Rate and Rhythm: Normal rate and regular rhythm.     Heart sounds: Normal heart sounds.  Pulmonary:     Effort: Pulmonary effort is normal. No respiratory distress.     Breath sounds: Normal breath sounds.  Chest:  Breasts:    Breasts are symmetrical.     Right: Normal. No swelling, inverted nipple, mass, nipple discharge, skin change or tenderness.     Left: Normal. No swelling, inverted nipple, mass, nipple discharge, skin change or tenderness.  Abdominal:     General: Bowel sounds are normal.     Palpations: Abdomen is soft. There is no mass.     Tenderness: There is no abdominal tenderness. There is no guarding or rebound.  Musculoskeletal:        General: Normal range of motion.     Cervical back: Normal range of motion and neck supple.  Lymphadenopathy:     Cervical: No cervical adenopathy.     Upper Body:     Right upper body: No supraclavicular, axillary or pectoral adenopathy.     Left upper body: No supraclavicular, axillary or pectoral adenopathy.  Skin:    General: Skin is warm and dry.  Neurological:     General: No focal deficit present.     Mental Status: She is alert and oriented to person, place, and time. Mental status is at baseline.  Psychiatric:        Mood and Affect: Mood normal.        Behavior: Behavior normal.        Thought Content: Thought content normal.        Judgment: Judgment normal.       Assessment & Plan:  Encounter for screening mammogram for malignant neoplasm of breast -     3D Screening Mammogram,  Left and Right; Future  Colon cancer screening -     Ambulatory referral to Gastroenterology  Encounter for preventive health examination Assessment & Plan: age appropriate education and counseling updated, referrals for preventative services and immunizations addressed, dietary and smoking counseling addressed, most recent labs reviewed.  I have personally reviewed and have noted:   1) the patient's medical and social history 2) The pt's use of alcohol, tobacco, and illicit drugs 3) The patient's current medications and supplements 4) Functional ability including ADL's, fall risk, home safety risk, hearing and visual impairment 5) Diet  and physical activities 6) Evidence for depression or mood disorder 7) The patient's height, weight, and BMI have been recorded in the chart   I have made referrals, and provided counseling and education based on review of the above    Hyperlipidemia LDL goal <100 Assessment & Plan: Managed with Zetia  due to statin myalgia   Lab Results  Component Value Date   CHOL 230 (H) 08/19/2024   HDL 72.80 08/19/2024   LDLCALC 127 (H) 08/19/2024   LDLDIRECT 126.0 08/19/2024   TRIG 152.0 (H) 08/19/2024   CHOLHDL 3 08/19/2024     Orders: -     Lipid panel -     LDL cholesterol, direct  Anemia, unspecified type -     CBC with Differential/Platelet  Other fatigue -     TSH  Impaired fasting glucose -     Comprehensive metabolic panel with GFR -     Hemoglobin A1c  Screening for lung cancer -     Ambulatory Referral for Lung Cancer Scre  Aneurysm, cerebral, nonruptured Assessment & Plan: ENDOVASCULAR EMBOLIZATION OF NEW SMALL BASILAR APEX ANEURYSM USING LVIS JR BRAIDED STENT  December 2022.  She has been referred by Dr Dolphus to a neurosurgeon to continue follow up (referral placed August 12). She should be taking Brilinta  90 mg twice daily  which I am refilling for her today .  Smoking cessation strongly advised    Depression with  anxiety Assessment & Plan: Did not tolerate increaesed dose of sertraline  due to agitaiton.  Continue lexapro . Adding buspirone  5-10 mg bid.     Neck pain, musculoskeletal Assessment & Plan: Refilling tramadol  for once daily use .  Advised to supplement with tylenol     Neuropathy Assessment & Plan: Screening for heavy metals , was  mormal.   Sshe has had repeatedly low b12 levels and advised to continue B12 supplementation with IM injections as her IF ab is positive.    Tobacco abuse Assessment & Plan: Counselling given regarding the risks of tobacco abuse.  Referral for lung ca screening again made/   B12 deficiency Assessment & Plan: She has been advised to  continue monthly  injections,  and was given refills  today.     Pernicious anemia Assessment & Plan: Untreated bc her pharmacy ([facility])  does not administer b12 injections .     Other orders -     Cyanocobalamin ; Inject 1 mL (1,000 mcg total) into the muscle every 30 (thirty) days.  Dispense: 3 mL; Refill: 2 -     Ezetimibe ; Take 1 tablet (10 mg total) by mouth daily.  Dispense: 90 tablet; Refill: 1 -     Syringe; Use to give b12 injections.  Dispense: 3 each; Refill: 3 -     traZODone  HCl; Take 0.5-1 tablets (25-50 mg total) by mouth at bedtime as needed for sleep.  Dispense: 30 tablet; Refill: 3 -     traMADol  HCl; Take 1 tablet (50 mg total) by mouth daily as needed. For back pain  Dispense: 30 tablet; Refill: 5 -     Tetanus-Diphth-Acell Pertussis; Inject 0.5 mLs into the muscle once for 1 dose.  Dispense: 0.5 mL; Refill: 0 -     busPIRone  HCl; Take 1 tablet (5 mg total) by mouth 2 (two) times daily.  Dispense: 180 tablet; Refill: 2      I provided 40 minutes of  face-to-face time during this encounter reviewing patient's current problems and past surgeries,  recent labs and imaging studies,  providing counseling on the above mentioned problems , and coordination  of care .   Follow-up: Return in about 3 months  (around 11/18/2024).   Verneita LITTIE Kettering, MD

## 2024-08-20 ENCOUNTER — Other Ambulatory Visit: Payer: Self-pay | Admitting: Internal Medicine

## 2024-08-20 ENCOUNTER — Encounter: Payer: Self-pay | Admitting: Internal Medicine

## 2024-08-20 NOTE — Telephone Encounter (Signed)
 Copied from CRM [id number]. Topic: Clinical - Medication Refill >> Aug 20, 2024 12:04 PM [place] E wrote: Medication: ticagrelor  (BRILINTA ) 90 MG TABS tablet  Has the patient contacted their pharmacy? Yes (Agent: If no, request that the patient contact the pharmacy for the refill. If patient does not wish to contact the pharmacy document the reason why and proceed with request.) (Agent: If yes, when and what did the pharmacy advise?)  This is the patient's preferred pharmacy:  [facility] - [place], [place] - [address] [place] [address] Phone: [phone] Fax: [phone]  Is this the correct pharmacy for this prescription? Yes If no, delete pharmacy and type the correct one.   Has the prescription been filled recently? No  Is the patient out of the medication? No, 5 pills left.  Has the patient been seen for an appointment in the last year OR does the patient have an upcoming appointment? Yes  Can we respond through MyChart? Yes  Agent: Please be advised that Rx refills may take up to 3 business days. We ask that you follow-up with your pharmacy.

## 2024-08-20 NOTE — Assessment & Plan Note (Addendum)
 Screening for heavy metals , was  mormal.   Sshe has had repeatedly low b12 levels and advised to continue B12 supplementation with IM injections as her IF ab is positive.

## 2024-08-20 NOTE — Assessment & Plan Note (Signed)
 Managed with Zetia  due to statin myalgia   Lab Results  Component Value Date   CHOL 230 (H) 08/19/2024   HDL 72.80 08/19/2024   LDLCALC 127 (H) 08/19/2024   LDLDIRECT 126.0 08/19/2024   TRIG 152.0 (H) 08/19/2024   CHOLHDL 3 08/19/2024

## 2024-08-20 NOTE — Assessment & Plan Note (Signed)
 Refilling tramadol  for once daily use .  Advised to supplement with tylenol 

## 2024-08-20 NOTE — Assessment & Plan Note (Addendum)
 She has been advised to  continue monthly  injections,  and was given refills  today.

## 2024-08-20 NOTE — Assessment & Plan Note (Signed)
 Did not tolerate increaesed dose of sertraline  due to agitaiton.  Continue lexapro . Adding buspirone  5-10 mg bid.

## 2024-08-20 NOTE — Assessment & Plan Note (Signed)

## 2024-08-20 NOTE — Telephone Encounter (Signed)
 Spoke with pharmacy and they are aware to keep the rx on file that was sent in for #30 with 5 refills.

## 2024-08-20 NOTE — Assessment & Plan Note (Signed)
 Counselling given regarding the risks of tobacco abuse.  Referral for lung ca screening again made/

## 2024-08-20 NOTE — Assessment & Plan Note (Addendum)
 ENDOVASCULAR EMBOLIZATION OF NEW SMALL BASILAR APEX ANEURYSM USING LVIS JR BRAIDED STENT  December 2022.  She has been referred by Dr Dolphus to a neurosurgeon to continue follow up (referral placed August 12). She should be taking Brilinta  90 mg twice daily  which I am refilling for her today .  Smoking cessation strongly advised

## 2024-08-20 NOTE — Assessment & Plan Note (Signed)
 Untreated bc her pharmacy ([facility])  does not administer b12 injections .

## 2024-08-21 ENCOUNTER — Ambulatory Visit: Payer: Self-pay | Admitting: Internal Medicine

## 2024-09-02 DIAGNOSIS — H2512 Age-related nuclear cataract, left eye: Secondary | ICD-10-CM | POA: Diagnosis not present

## 2024-09-11 DIAGNOSIS — H2512 Age-related nuclear cataract, left eye: Secondary | ICD-10-CM | POA: Diagnosis not present

## 2024-10-23 ENCOUNTER — Other Ambulatory Visit: Payer: Self-pay | Admitting: Internal Medicine

## 2024-10-26 ENCOUNTER — Telehealth: Payer: Self-pay

## 2024-10-26 ENCOUNTER — Other Ambulatory Visit (HOSPITAL_COMMUNITY): Payer: Self-pay

## 2024-10-26 NOTE — Telephone Encounter (Signed)
 Pharmacy Patient Advocate Encounter   Received notification from Onbase that prior authorization for TICAGRELOR  (BRILINTA ) 90 MG TABLET  is required/requested.   Insurance verification completed.   The patient is insured through [facility] ADVANTAGE/RX ADVANCE.   Per test claim: Refill too soon. PA is not needed at this time. Medication was filled 10/26/2024. Next eligible fill date is 09/18/2024.

## 2024-10-30 ENCOUNTER — Other Ambulatory Visit (HOSPITAL_COMMUNITY): Payer: Self-pay

## 2024-10-30 ENCOUNTER — Telehealth: Payer: Self-pay

## 2024-10-30 NOTE — Telephone Encounter (Signed)
 Pharmacy Patient Advocate Encounter   Received notification from Patient Advice Request messages that prior authorization for Ticagrelor  90MG  tablets    Insurance verification completed.   The patient is insured through [facility] ADVANTAGE/RX ADVANCE.   Per test claim: Refill too soon. PA is not needed at this time. Medication was filled 11.17/2025. Next eligible fill date is 11/18/2024.

## 2024-12-10 ENCOUNTER — Other Ambulatory Visit: Payer: Self-pay | Admitting: Internal Medicine

## 2024-12-16 ENCOUNTER — Other Ambulatory Visit: Payer: Self-pay | Admitting: Internal Medicine

## 2024-12-29 ENCOUNTER — Other Ambulatory Visit: Payer: Self-pay | Admitting: Neurosurgery

## 2024-12-29 DIAGNOSIS — I671 Cerebral aneurysm, nonruptured: Secondary | ICD-10-CM

## 2025-01-08 ENCOUNTER — Other Ambulatory Visit: Payer: Self-pay | Admitting: Neurosurgery

## 2025-01-08 ENCOUNTER — Other Ambulatory Visit: Payer: Self-pay

## 2025-01-08 ENCOUNTER — Ambulatory Visit (HOSPITAL_COMMUNITY)
Admission: RE | Admit: 2025-01-08 | Discharge: 2025-01-08 | Disposition: A | Source: Ambulatory Visit | Attending: Neurosurgery

## 2025-01-08 DIAGNOSIS — Z7902 Long term (current) use of antithrombotics/antiplatelets: Secondary | ICD-10-CM | POA: Diagnosis not present

## 2025-01-08 DIAGNOSIS — I671 Cerebral aneurysm, nonruptured: Secondary | ICD-10-CM

## 2025-01-08 DIAGNOSIS — F17290 Nicotine dependence, other tobacco product, uncomplicated: Secondary | ICD-10-CM | POA: Insufficient documentation

## 2025-01-08 LAB — BASIC METABOLIC PANEL WITH GFR
Anion gap: 10 (ref 5–15)
BUN: 19 mg/dL (ref 8–23)
CO2: 25 mmol/L (ref 22–32)
Calcium: 10.3 mg/dL (ref 8.9–10.3)
Chloride: 104 mmol/L (ref 98–111)
Creatinine, Ser: 0.75 mg/dL (ref 0.44–1.00)
GFR, Estimated: >60 mL/min
Glucose, Bld: 96 mg/dL (ref 70–99)
Potassium: 4.4 mmol/L (ref 3.5–5.1)
Sodium: 140 mmol/L (ref 135–145)

## 2025-01-08 LAB — CBC WITH DIFFERENTIAL/PLATELET
Abs Immature Granulocytes: 0.02 10*3/uL (ref 0.00–0.07)
Basophils Absolute: 0.1 10*3/uL (ref 0.0–0.1)
Basophils Relative: 1 %
Eosinophils Absolute: 0.1 10*3/uL (ref 0.0–0.5)
Eosinophils Relative: 1 %
HCT: 42.4 % (ref 36.0–46.0)
Hemoglobin: 14.4 g/dL (ref 12.0–15.0)
Immature Granulocytes: 0 %
Lymphocytes Relative: 22 %
Lymphs Abs: 2.2 10*3/uL (ref 0.7–4.0)
MCH: 32.1 pg (ref 26.0–34.0)
MCHC: 34 g/dL (ref 30.0–36.0)
MCV: 94.6 fL (ref 80.0–100.0)
Monocytes Absolute: 0.9 10*3/uL (ref 0.1–1.0)
Monocytes Relative: 9 %
Neutro Abs: 6.7 10*3/uL (ref 1.7–7.7)
Neutrophils Relative %: 67 %
Platelets: 267 10*3/uL (ref 150–400)
RBC: 4.48 MIL/uL (ref 3.87–5.11)
RDW: 14.3 % (ref 11.5–15.5)
WBC: 10 10*3/uL (ref 4.0–10.5)
nRBC: 0 % (ref 0.0–0.2)

## 2025-01-08 LAB — PROTIME-INR
INR: 0.9 (ref 0.8–1.2)
Prothrombin Time: 12.5 s (ref 11.4–15.2)

## 2025-01-08 MED ORDER — FENTANYL CITRATE (PF) 100 MCG/2ML IJ SOLN
INTRAMUSCULAR | Status: AC
  Filled 2025-01-08: qty 2

## 2025-01-08 MED ORDER — HEPARIN SODIUM (PORCINE) 1000 UNIT/ML IJ SOLN
INTRAMUSCULAR | Status: AC
  Filled 2025-01-08: qty 10

## 2025-01-08 MED ORDER — IOHEXOL 300 MG/ML  SOLN
100.0000 mL | Freq: Once | INTRAMUSCULAR | Status: AC | PRN
  Administered 2025-01-08: 50 mL via INTRA_ARTERIAL

## 2025-01-08 MED ORDER — MIDAZOLAM HCL 2 MG/2ML IJ SOLN
INTRAMUSCULAR | Status: AC
  Filled 2025-01-08: qty 2

## 2025-01-08 MED ORDER — HEPARIN SODIUM (PORCINE) 1000 UNIT/ML IJ SOLN
INTRAMUSCULAR | Status: AC | PRN
  Administered 2025-01-08: 2000 [IU] via INTRAVENOUS

## 2025-01-08 MED ORDER — SODIUM CHLORIDE 0.9 % IV SOLN: INTRAVENOUS | Status: DC

## 2025-01-08 MED ORDER — MIDAZOLAM HCL (PF) 2 MG/2ML IJ SOLN
INTRAMUSCULAR | Status: AC | PRN
  Administered 2025-01-08: 1 mg via INTRAVENOUS

## 2025-01-08 MED ORDER — FENTANYL CITRATE (PF) 100 MCG/2ML IJ SOLN
INTRAMUSCULAR | Status: AC | PRN
  Administered 2025-01-08 (×3): 25 ug via INTRAVENOUS

## 2025-01-08 MED ORDER — CEFAZOLIN SODIUM-DEXTROSE 2-4 GM/100ML-% IV SOLN: 2.0000 g | INTRAVENOUS | Status: DC

## 2025-01-08 MED ORDER — LIDOCAINE HCL 1 % IJ SOLN
20.0000 mL | Freq: Once | INTRAMUSCULAR | Status: AC
  Administered 2025-01-08: 10 mL

## 2025-01-08 MED ORDER — HYDROCODONE-ACETAMINOPHEN 5-325 MG PO TABS: 1.0000 | ORAL_TABLET | ORAL | Status: DC | PRN

## 2025-01-08 MED ORDER — LIDOCAINE HCL 1 % IJ SOLN
INTRAMUSCULAR | Status: AC
  Filled 2025-01-08: qty 20

## 2025-01-13 ENCOUNTER — Encounter (HOSPITAL_COMMUNITY): Payer: Self-pay

## 2025-07-09 ENCOUNTER — Ambulatory Visit
